# Patient Record
Sex: Male | Born: 1955 | Race: Black or African American | Hispanic: No | Marital: Single | Smoking: Current every day smoker
Health system: Southern US, Community
[De-identification: ages and names within clinical notes are randomized; demographics above are authoritative.]

## PROBLEM LIST (undated history)

## (undated) DIAGNOSIS — M545 Low back pain, unspecified: Secondary | ICD-10-CM

## (undated) DIAGNOSIS — I10 Essential (primary) hypertension: Secondary | ICD-10-CM

## (undated) DIAGNOSIS — K219 Gastro-esophageal reflux disease without esophagitis: Secondary | ICD-10-CM

## (undated) DIAGNOSIS — G8929 Other chronic pain: Secondary | ICD-10-CM

## (undated) HISTORY — DX: Other chronic pain: G89.29

## (undated) HISTORY — DX: Essential (primary) hypertension: I10

## (undated) HISTORY — DX: Low back pain, unspecified: M54.50

---

## 2014-07-08 ENCOUNTER — Encounter (HOSPITAL_COMMUNITY): Payer: Self-pay

## 2014-07-08 ENCOUNTER — Emergency Department (HOSPITAL_COMMUNITY): Payer: Self-pay

## 2014-07-08 ENCOUNTER — Emergency Department (HOSPITAL_COMMUNITY)
Admission: EM | Admit: 2014-07-08 | Discharge: 2014-07-08 | Disposition: A | Discharge status: Home / Self Care | Payer: Self-pay | Attending: Emergency Medicine | Admitting: Emergency Medicine

## 2014-07-08 DIAGNOSIS — Y9389 Activity, other specified: Secondary | ICD-10-CM | POA: Insufficient documentation

## 2014-07-08 DIAGNOSIS — S01111A Laceration without foreign body of right eyelid and periocular area, initial encounter: Secondary | ICD-10-CM | POA: Insufficient documentation

## 2014-07-08 DIAGNOSIS — Y998 Other external cause status: Secondary | ICD-10-CM | POA: Insufficient documentation

## 2014-07-08 DIAGNOSIS — Z720 Tobacco use: Secondary | ICD-10-CM | POA: Insufficient documentation

## 2014-07-08 DIAGNOSIS — W01198A Fall on same level from slipping, tripping and stumbling with subsequent striking against other object, initial encounter: Secondary | ICD-10-CM | POA: Insufficient documentation

## 2014-07-08 DIAGNOSIS — Y9289 Other specified places as the place of occurrence of the external cause: Secondary | ICD-10-CM | POA: Insufficient documentation

## 2014-07-08 DIAGNOSIS — S0181XA Laceration without foreign body of other part of head, initial encounter: Secondary | ICD-10-CM

## 2014-07-08 MED ORDER — TETANUS-DIPHTH-ACELL PERTUSSIS 5-2.5-18.5 LF-MCG/0.5 IM SUSP
0.5000 mL | Freq: Once | INTRAMUSCULAR | Status: AC
  Administered 2014-07-08: 0.5 mL via INTRAMUSCULAR
  Filled 2014-07-08: qty 0.5

## 2014-07-08 MED ORDER — LIDOCAINE HCL (PF) 2 % IJ SOLN
10.0000 mL | Freq: Once | INTRAMUSCULAR | Status: DC
  Filled 2014-07-08: qty 10

## 2014-07-08 NOTE — ED Provider Notes (Signed)
CSN: [id number]     Arrival date & time 07/08/14  [id number] History   First MD Initiated Contact with Patient 07/08/14 1001     Chief Complaint  Patient presents with  . Facial Laceration     (Consider location/radiation/quality/duration/timing/severity/associated sxs/prior Treatment) HPI Comments: Pt comes in with c/o laceration that occurred about 1 am. Denies loc with injury. States that he was getting wood and the window pane came out an hit his face. States that  He didn't come in then because he thought it was going to close but now it looks more open then it did previously. Denies pain  The history is provided by the patient. No language interpreter was used.    History reviewed. No pertinent past medical history. History reviewed. No pertinent past surgical history. No family history on file. History  Substance Use Topics  . Smoking status: Current Every Day Smoker  . Smokeless tobacco: Not on file  . Alcohol Use: Yes     Comment: occ    Review of Systems  All other systems reviewed and are negative.     Allergies  Review of patient's allergies indicates no known allergies.  Home Medications   Prior to Admission medications   Medication Sig Start Date End Date Taking? Authorizing Provider  ibuprofen (ADVIL,MOTRIN) 200 MG tablet Take 400 mg by mouth every 6 (six) hours as needed for moderate pain.   Yes Historical Provider, MD   BP 148/118 mmHg  Pulse 51  Temp(Src) 97.6 F (36.4 C) (Oral)  Resp 16  Ht 5\' 9"  (1.753 m)  Wt 145 lb (65.772 kg)  BMI 21.40 kg/m2  SpO2 99% Physical Exam  Constitutional: He is oriented to person, place, and time. He appears well-developed and well-nourished.  HENT:  Head: Normocephalic.  Eyes: Conjunctivae and EOM are normal. Pupils are equal, round, and reactive to light.  Cardiovascular: Normal rate and regular rhythm.   Pulmonary/Chest: Effort normal and breath sounds normal.  Musculoskeletal: Normal range of motion.    Neurological: He is alert and oriented to person, place, and time.  Skin:     Laceration from the right eyebrow to the bridge of the nose    ED Course  LACERATION REPAIR Date/Time: 07/08/2014 11:15 AM Performed by: Teressa LowerPICKERING, Lyfe Monger Authorized by: Teressa LowerPICKERING, Stacy Sailer Consent: Verbal consent obtained. Risks and benefits: risks, benefits and alternatives were discussed Consent given by: patient Patient identity confirmed: verbally with patient Body area: head/neck Location details: right eyelid Laceration length: 4.5 cm Foreign bodies: no foreign bodies Anesthesia: local infiltration Local anesthetic: lidocaine 2% without epinephrine Irrigation solution: saline Skin closure: 5-0 Prolene Number of sutures: 7 Technique: simple Approximation: close Approximation difficulty: simple Patient tolerance: Patient tolerated the procedure well with no immediate complications   (including critical care time) Labs Review Labs Reviewed - No data to display  Imaging Review Dg Nasal Bones  07/08/2014   CLINICAL DATA:  Pain following object falling onto face  EXAM: NASAL BONES - 3+ VIEW  COMPARISON:  None.  FINDINGS: Water's, right lateral, and left lateral images were obtained. There is no acute fracture or dislocation. There is opacification of the left maxillary antrum. Other paranasal sinuses are clear. Nasal septum is midline.  IMPRESSION: Diffuse opacification of the left maxillary antrum. Question sinusitis versus hemorrhage. No fracture or dislocation seen.   Electronically Signed   By: Bretta BangWilliam  Woodruff III M.D.   On: 07/08/2014 12:32     EKG Interpretation None      MDM  Final diagnoses:  Facial laceration, initial encounter    Tetanus updated. No fracture noted. No need for antibiotics at this time. Discussed treatment and suture removal    Teressa Lower, NP 07/08/14 1312  Glynn Octave, MD 07/08/14 [id number]

## 2014-07-08 NOTE — Discharge Instructions (Signed)
Have the sutures removed in 5-7 days. Follow up sooner for any sign of infection Facial Laceration  A facial laceration is a cut on the face. These injuries can be painful and cause bleeding. Lacerations usually heal quickly, but they need special care to reduce scarring. DIAGNOSIS  Your health care provider will take a medical history, ask for details about how the injury occurred, and examine the wound to determine how deep the cut is. TREATMENT  Some facial lacerations may not require closure. Others may not be able to be closed because of an increased risk of infection. The risk of infection and the chance for successful closure will depend on various factors, including the amount of time since the injury occurred. The wound may be cleaned to help prevent infection. If closure is appropriate, pain medicines may be given if needed. Your health care provider will use stitches (sutures), wound glue (adhesive), or skin adhesive strips to repair the laceration. These tools bring the skin edges together to allow for faster healing and a better cosmetic outcome. If needed, you may also be given a tetanus shot. HOME CARE INSTRUCTIONS  Only take over-the-counter or prescription medicines as directed by your health care provider.  Follow your health care provider's instructions for wound care. These instructions will vary depending on the technique used for closing the wound. For Sutures:  Keep the wound clean and dry.   If you were given a bandage (dressing), you should change it at least once a day. Also change the dressing if it becomes wet or dirty, or as directed by your health care provider.   Wash the wound with soap and water 2 times a day. Rinse the wound off with water to remove all soap. Pat the wound dry with a clean towel.   After cleaning, apply a thin layer of the antibiotic ointment recommended by your health care provider. This will help prevent infection and keep the dressing from  sticking.   You may shower as usual after the first 24 hours. Do not soak the wound in water until the sutures are removed.   Get your sutures removed as directed by your health care provider. With facial lacerations, sutures should usually be taken out after 4-5 days to avoid stitch marks.   Wait a few days after your sutures are removed before applying any makeup. For Skin Adhesive Strips:  Keep the wound clean and dry.   Do not get the skin adhesive strips wet. You may bathe carefully, using caution to keep the wound dry.   If the wound gets wet, pat it dry with a clean towel.   Skin adhesive strips will fall off on their own. You may trim the strips as the wound heals. Do not remove skin adhesive strips that are still stuck to the wound. They will fall off in time.  For Wound Adhesive:  You may briefly wet your wound in the shower or bath. Do not soak or scrub the wound. Do not swim. Avoid periods of heavy sweating until the skin adhesive has fallen off on its own. After showering or bathing, gently pat the wound dry with a clean towel.   Do not apply liquid medicine, cream medicine, ointment medicine, or makeup to your wound while the skin adhesive is in place. This may loosen the film before your wound is healed.   If a dressing is placed over the wound, be careful not to apply tape directly over the skin adhesive. This may  cause the adhesive to be pulled off before the wound is healed.   Avoid prolonged exposure to sunlight or tanning lamps while the skin adhesive is in place.  The skin adhesive will usually remain in place for 5-10 days, then naturally fall off the skin. Do not pick at the adhesive film.  After Healing: Once the wound has healed, cover the wound with sunscreen during the day for 1 full year. This can help minimize scarring. Exposure to ultraviolet light in the first year will darken the scar. It can take 1-2 years for the scar to lose its redness and to  heal completely.  SEEK IMMEDIATE MEDICAL CARE IF:  You have redness, pain, or swelling around the wound.   You see ayellowish-white fluid (pus) coming from the wound.   You have chills or a fever.  MAKE SURE YOU:  Understand these instructions.  Will watch your condition.  Will get help right away if you are not doing well or get worse. Document Released: 06/21/2004 Document Revised: 03/04/2013 Document Reviewed: 12/25/2012 [facility] Patient Information 2015 [place], [place]. This information is not intended to replace advice given to you by your health care provider. Make sure you discuss any questions you have with your health care provider.

## 2014-07-08 NOTE — ED Notes (Signed)
Pt reports was outside getting some wood and one of the window panes from the 2nd floor fell and hit pt in the forehead.  Denies any LOC.

## 2017-03-05 ENCOUNTER — Emergency Department (HOSPITAL_COMMUNITY)
Admission: EM | Admit: 2017-03-05 | Discharge: 2017-03-05 | Disposition: A | Discharge status: Home / Self Care | Payer: Self-pay | Attending: Emergency Medicine | Admitting: Emergency Medicine

## 2017-03-05 ENCOUNTER — Encounter (HOSPITAL_COMMUNITY): Payer: Self-pay | Admitting: Emergency Medicine

## 2017-03-05 ENCOUNTER — Emergency Department (HOSPITAL_COMMUNITY): Payer: Self-pay

## 2017-03-05 DIAGNOSIS — Z79899 Other long term (current) drug therapy: Secondary | ICD-10-CM | POA: Insufficient documentation

## 2017-03-05 DIAGNOSIS — F17200 Nicotine dependence, unspecified, uncomplicated: Secondary | ICD-10-CM | POA: Insufficient documentation

## 2017-03-05 DIAGNOSIS — S61212A Laceration without foreign body of right middle finger without damage to nail, initial encounter: Secondary | ICD-10-CM | POA: Insufficient documentation

## 2017-03-05 DIAGNOSIS — S62639B Displaced fracture of distal phalanx of unspecified finger, initial encounter for open fracture: Secondary | ICD-10-CM

## 2017-03-05 DIAGNOSIS — T1490XA Injury, unspecified, initial encounter: Secondary | ICD-10-CM

## 2017-03-05 DIAGNOSIS — Z7982 Long term (current) use of aspirin: Secondary | ICD-10-CM | POA: Insufficient documentation

## 2017-03-05 DIAGNOSIS — W231XXA Caught, crushed, jammed, or pinched between stationary objects, initial encounter: Secondary | ICD-10-CM | POA: Insufficient documentation

## 2017-03-05 DIAGNOSIS — Y939 Activity, unspecified: Secondary | ICD-10-CM | POA: Insufficient documentation

## 2017-03-05 DIAGNOSIS — Z23 Encounter for immunization: Secondary | ICD-10-CM | POA: Insufficient documentation

## 2017-03-05 DIAGNOSIS — S61319A Laceration without foreign body of unspecified finger with damage to nail, initial encounter: Secondary | ICD-10-CM

## 2017-03-05 DIAGNOSIS — S62632A Displaced fracture of distal phalanx of right middle finger, initial encounter for closed fracture: Secondary | ICD-10-CM | POA: Insufficient documentation

## 2017-03-05 DIAGNOSIS — Y929 Unspecified place or not applicable: Secondary | ICD-10-CM | POA: Insufficient documentation

## 2017-03-05 DIAGNOSIS — Y999 Unspecified external cause status: Secondary | ICD-10-CM | POA: Insufficient documentation

## 2017-03-05 DIAGNOSIS — S62634A Displaced fracture of distal phalanx of right ring finger, initial encounter for closed fracture: Secondary | ICD-10-CM | POA: Insufficient documentation

## 2017-03-05 MED ORDER — CEFAZOLIN SODIUM-DEXTROSE 1-4 GM/50ML-% IV SOLN
1.0000 g | Freq: Once | INTRAVENOUS | Status: AC
  Administered 2017-03-05: 1 g via INTRAVENOUS
  Filled 2017-03-05: qty 50

## 2017-03-05 MED ORDER — POVIDONE-IODINE 10 % EX SOLN
CUTANEOUS | Status: AC
  Administered 2017-03-05: 1
  Filled 2017-03-05: qty 30

## 2017-03-05 MED ORDER — ONDANSETRON HCL 4 MG/2ML IJ SOLN
INTRAMUSCULAR | Status: AC
  Administered 2017-03-05: 4 mg via INTRAVENOUS
  Filled 2017-03-05: qty 2

## 2017-03-05 MED ORDER — TETANUS-DIPHTH-ACELL PERTUSSIS 5-2.5-18.5 LF-MCG/0.5 IM SUSP
0.5000 mL | Freq: Once | INTRAMUSCULAR | Status: AC
  Administered 2017-03-05: 0.5 mL via INTRAMUSCULAR
  Filled 2017-03-05: qty 0.5

## 2017-03-05 MED ORDER — CEPHALEXIN 500 MG PO CAPS: 500.0000 mg | ORAL_CAPSULE | Freq: Four times a day (QID) | ORAL | 0 refills | Status: DC

## 2017-03-05 MED ORDER — SULFAMETHOXAZOLE-TRIMETHOPRIM 800-160 MG PO TABS: 1.0000 | ORAL_TABLET | Freq: Two times a day (BID) | ORAL | 0 refills | Status: AC

## 2017-03-05 MED ORDER — HYDROCODONE-ACETAMINOPHEN 5-325 MG PO TABS
1.0000 | ORAL_TABLET | Freq: Once | ORAL | Status: AC
  Administered 2017-03-05: 1 via ORAL

## 2017-03-05 MED ORDER — HYDROMORPHONE HCL 1 MG/ML IJ SOLN
1.0000 mg | Freq: Once | INTRAMUSCULAR | Status: AC
  Administered 2017-03-05: 1 mg via INTRAVENOUS

## 2017-03-05 MED ORDER — BACITRACIN-NEOMYCIN-POLYMYXIN 400-5-5000 EX OINT
TOPICAL_OINTMENT | CUTANEOUS | Status: AC
  Filled 2017-03-05: qty 1

## 2017-03-05 MED ORDER — HYDROCODONE-ACETAMINOPHEN 5-325 MG PO TABS: 1.0000 | ORAL_TABLET | Freq: Four times a day (QID) | ORAL | 0 refills | Status: DC | PRN

## 2017-03-05 MED ORDER — ACETAMINOPHEN ER 650 MG PO TBCR: 650.0000 mg | EXTENDED_RELEASE_TABLET | Freq: Three times a day (TID) | ORAL | 0 refills | Status: DC | PRN

## 2017-03-05 MED ORDER — LIDOCAINE-EPINEPHRINE (PF) 2 %-1:200000 IJ SOLN
10.0000 mL | Freq: Once | INTRAMUSCULAR | Status: AC
  Administered 2017-03-05: 5 mL
  Filled 2017-03-05: qty 20

## 2017-03-05 MED ORDER — HYDROCODONE-ACETAMINOPHEN 5-325 MG PO TABS
ORAL_TABLET | ORAL | Status: AC
  Administered 2017-03-05: 1 via ORAL
  Filled 2017-03-05: qty 1

## 2017-03-05 MED ORDER — BACITRACIN-NEOMYCIN-POLYMYXIN 400-5-5000 EX OINT
TOPICAL_OINTMENT | Freq: Once | CUTANEOUS | Status: AC
  Administered 2017-03-05: 16:00:00 via TOPICAL

## 2017-03-05 MED ORDER — ONDANSETRON HCL 4 MG/2ML IJ SOLN
4.0000 mg | Freq: Once | INTRAMUSCULAR | Status: AC
  Administered 2017-03-05: 4 mg via INTRAVENOUS

## 2017-03-05 MED ORDER — HYDROMORPHONE HCL 1 MG/ML IJ SOLN
INTRAMUSCULAR | Status: AC
  Administered 2017-03-05: 1 mg via INTRAVENOUS
  Filled 2017-03-05: qty 1

## 2017-03-05 NOTE — ED Notes (Signed)
Suture cart to bedside. 

## 2017-03-05 NOTE — ED Triage Notes (Signed)
Pt had log fall onto right hand pta. Gapping wounds to right ring and middle finger with bleeding controlled. Swelling noted also

## 2017-03-05 NOTE — Discharge Instructions (Signed)
Please see the orthopedic surgeon as requested this week. Call them upon discharge for an appointment by Friday. Start taking the antibiotics as prescribed. Return to the ER if your pain gets severe.  Keep the dressing on for 2 days.

## 2017-03-05 NOTE — ED Notes (Signed)
Patient transported to X-ray 

## 2017-03-05 NOTE — ED Provider Notes (Addendum)
[facility]-EMERGENCY DEPT Provider Note   CSN: [id number] Arrival date & time: 03/05/17  1242     History   Chief Complaint Chief Complaint  Patient presents with  . Laceration    HPI Paul Madden is a 61 y.o. male.  HPI Patient comes in the chief complaint of finger injury. Patient has no pertinent past medical history. He reports that he was working outside and had a long fall onto his right hand. Patient is noted to have deep lacerations to digits 3 and 4 with some bleeding. Patient denies any numbness or tingling. Tetanus status is unknown. Patient is not on any anticoagulation meds.  History reviewed. No pertinent past medical history.  There are no active problems to display for this patient.   History reviewed. No pertinent surgical history.     Home Medications    Prior to Admission medications   Medication Sig Start Date End Date Taking? Authorizing Provider  aspirin-sod bicarb-citric acid (ALKA-SELTZER) 325 MG TBEF tablet Take 325 mg by mouth every 6 (six) hours as needed (acid reflux).   Yes [provider]  ibuprofen (ADVIL,MOTRIN) 200 MG tablet Take 400 mg by mouth every 6 (six) hours as needed for moderate pain.   Yes [provider]  acetaminophen (TYLENOL 8 HOUR) 650 MG CR tablet Take 1 tablet (650 mg total) by mouth every 8 (eight) hours as needed for pain. 03/05/17   Derwood Kaplan, MD  cephALEXin (KEFLEX) 500 MG capsule Take 1 capsule (500 mg total) by mouth 4 (four) times daily. 03/05/17   Derwood Kaplan, MD  HYDROcodone-acetaminophen (NORCO/VICODIN) 5-325 MG tablet Take 1 tablet by mouth every 6 (six) hours as needed for severe pain. 03/05/17   Derwood Kaplan, MD  sulfamethoxazole-trimethoprim (BACTRIM DS,SEPTRA DS) 800-160 MG tablet Take 1 tablet by mouth 2 (two) times daily. 03/05/17 03/12/17  Derwood Kaplan, MD    Family History History reviewed. No pertinent family history.  Social History Social History  Substance Use Topics  .  Smoking status: Current Every Day Smoker  . Smokeless tobacco: Never Used  . Alcohol use Yes     Comment: occ     Allergies   Patient has no known allergies.   Review of Systems Review of Systems  Constitutional: Positive for activity change.  Skin: Positive for wound.  Allergic/Immunologic: Negative for immunocompromised state.  Neurological: Negative for numbness.  Hematological: Does not bruise/bleed easily.  All other systems reviewed and are negative.    Physical Exam Updated Vital Signs BP (!) 162/95 (BP Location: Left Arm)   Pulse 95   Temp 98 F (36.7 C) (Oral)   Resp 17   SpO2 95%   Physical Exam  Constitutional: He is oriented to person, place, and time. He appears well-developed.  HENT:  Head: Atraumatic.  Neck: Neck supple.  Cardiovascular: Normal rate.   Pulmonary/Chest: Effort normal.  Musculoskeletal:  Digit 3 and 4 have deep, gaping lacerations without any macerated tissue. The laceration is complex, and on digit #4 extends towards the nail laterally.  The lacerations are distal to the DIP joint. Patient also has subungual hematoma.  There are multiple other small lacerations on the same digits. Those lacerations are superficial in nature.  The nail integrity is not compromised   Neurological: He is alert and oriented to person, place, and time.  Patient able to move his digits distal to the DIP joint, and patient is able to discriminate between sharp and dull for both digits  Skin: Skin is warm.  Nursing note and vitals reviewed.    ED Treatments / Results  Labs (all labs ordered are listed, but only abnormal results are displayed) Labs Reviewed - No data to display  EKG  EKG Interpretation None       Radiology Dg Hand Complete Right  Result Date: 03/05/2017 CLINICAL DATA:  Walled fell on hand. EXAM: RIGHT HAND - COMPLETE 3+ VIEW COMPARISON:  None. FINDINGS: Comminuted fracture deformities are noted involving the tufts of the third  and fourth distal phalanges. The fracture fragments appear splayed and displaced dorsally. Overlying soft tissue lacerations noted. IMPRESSION: 1. Acute fracture deformities are noted involving the tufts of the third and fourth distal phalanges. Electronically Signed   By: Signa Kell M.D.   On: 03/05/2017 13:44    Procedures  DIGIT 3  .Nerve Block Date/Time: 03/05/2017 4:31 PM Performed by: Derwood Kaplan Authorized by: Derwood Kaplan   Consent:    Consent obtained:  Verbal   Consent given by:  Patient   Risks discussed:  Allergic reaction, infection, nerve damage, swelling, unsuccessful block, pain and bleeding   Alternatives discussed:  No treatment Indications:    Indications:  Pain relief and procedural anesthesia Location:    Body area:  Upper extremity (digit 3, right hand)   Upper extremity nerve blocked: DIGITAL NERVE BLOCK.   Laterality:  Right Pre-procedure details:    Skin preparation:  2% chlorhexidine   Preparation: Patient was prepped and draped in usual sterile fashion   Procedure details (see MAR for exact dosages):    Block needle gauge:  25 G   Anesthetic injected:  Lidocaine 2% WITH epi   Injection procedure:  Anatomic landmarks identified Post-procedure details:    Dressing:  Sterile dressing   Outcome:  Pain relieved   Patient tolerance of procedure:  Tolerated well, no immediate complications    Procedures DIGIT 4 .Nerve Block Date/Time: 03/05/2017 4:31 PM Performed by: Derwood Kaplan Authorized by: Derwood Kaplan   Consent:    Consent obtained:  Verbal   Consent given by:  Patient   Risks discussed:  Allergic reaction, infection, nerve damage, swelling, unsuccessful block, pain and bleeding   Alternatives discussed:  No treatment Indications:    Indications:  Pain relief and procedural anesthesia Location:    Body area:  Upper extremity   Upper extremity nerve blocked: DIGITAL NERVE BLOCK.   Laterality:  Right Pre-procedure details:     Skin preparation:  2% chlorhexidine   Preparation: Patient was prepped and draped in usual sterile fashion   Procedure details (see MAR for exact dosages):    Block needle gauge:  25 G   Anesthetic injected:  Lidocaine 2% WITH epi   Injection procedure:  Anatomic landmarks identified Post-procedure details:    Dressing:  Sterile dressing   Outcome:  Pain relieved   Patient tolerance of procedure:  Tolerated well, no immediate complications  LACERATION REPAIR Performed by: Derwood Kaplan Authorized by: Derwood Kaplan Consent: Verbal consent obtained. Risks and benefits: risks, benefits and alternatives were discussed Consent given by: patient Patient identity confirmed: provided demographic data Prepped and Draped in normal sterile fashion Wound explored  Laceration Location: right hand digit 3 and 4  Laceration Length: total: 8 cm  No Foreign Bodies seen or palpated  Anesthesia: digital nerve block  Irrigation method: syringe Amount of cleaning: copious  Skin closure: primary  Number of sutures: 17  Technique: simple inturrupted with 4-0 and 5-0 nylon  Patient tolerance: Patient tolerated the procedure well with no  immediate complications.  SPLINT APPLICATION Date/Time: 4:39 PM Authorized by: Derwood Kaplan Consent: Verbal consent obtained. Risks and benefits: risks, benefits and alternatives were discussed Consent given by: patient Splint applied by: Nurse Location details: finger 2 and 3, R hand Splint type: static Supplies used: webrill, gauze, kerlex, splint Post-procedure: The splinted body part was neurovascularly unchanged following the procedure. Patient tolerance: Patient tolerated the procedure well with no immediate complications.     (including critical care time)  Medications Ordered in ED Medications  HYDROmorphone (DILAUDID) injection 1 mg (1 mg Intravenous Given 03/05/17 1304)  ondansetron (ZOFRAN) injection 4 mg (4 mg Intravenous Given  03/05/17 1304)  lidocaine-EPINEPHrine (XYLOCAINE W/EPI) 2 %-1:200000 (PF) injection 10 mL (5 mLs Infiltration Given 03/05/17 1428)  Tdap (BOOSTRIX) injection 0.5 mL (0.5 mLs Intramuscular Given 03/05/17 1420)  povidone-iodine (BETADINE) 10 % external solution (1 application  Given 03/05/17 1428)  ceFAZolin (ANCEF) IVPB 1 g/50 mL premix (0 g Intravenous Stopped 03/05/17 1456)  neomycin-bacitracin-polymyxin (NEOSPORIN) ointment ( Topical Given 03/05/17 1616)     Initial Impression / Assessment and Plan / ED Course  I have reviewed the triage vital signs and the nursing notes.  Pertinent labs & imaging results that were available during my care of the patient were reviewed by me and considered in my medical decision making (see chart for details).  Clinical Course as of Mar 06 1627  Tue Mar 05, 2017  1433 Discussed case with Dr. Eulah Pont, orthopedics. Dr. Eulah Pont has reviewed the image and is aware of the laceration that the patient is sustained. He recommends laceration repair in the emergency room and bulky dressing. He would want the patient to be started on Keflex and be seen in his clinic later this week. I have discussed these recommendations with the patient. DG Hand Complete Right [AN]    Clinical Course User Index [AN] Derwood Kaplan, MD    Patient comes in with crushtype injury to his digits 3 and 4. Patient has open tuft fractures with complex lacerations. Fortunately the skin despite the deep laceration is not macerated. I spoke with hand surgery, Dr. Eulah Pont would appreciate repair in the emergency department. He will see the patient in the clinic later this week.  Patient informed the high risk of infection due to the contaminated nature of the wound. After performing a digital nerve block, the copiously irrigated the wound with the Water initially and then sterile normal saline. The laceration was repaired to the best of my ability and there were no immediate complications.  Patient's  wound will be placed in a splint and a bulky wound as per the request of Dr. Eulah Pont. Strict return precautions have been discussed. Patient will be started on antibiotics.  The patient was counseled on the dangers of tobacco use, and was advised to quit.  Reviewed strategies to maximize success, including removing cigarettes and smoking materials from environment, substitution of other forms of reinforcement and support of family/friends.   Final Clinical Impressions(s) / ED Diagnoses   Final diagnoses:  Open fracture of tuft of distal phalanx of finger  Laceration of finger of right hand without foreign body with damage to nail, unspecified finger, initial encounter    New Prescriptions New Prescriptions   ACETAMINOPHEN (TYLENOL 8 HOUR) 650 MG CR TABLET    Take 1 tablet (650 mg total) by mouth every 8 (eight) hours as needed for pain.   CEPHALEXIN (KEFLEX) 500 MG CAPSULE    Take 1 capsule (500 mg total) by mouth 4 (  four) times daily.   HYDROCODONE-ACETAMINOPHEN (NORCO/VICODIN) 5-325 MG TABLET    Take 1 tablet by mouth every 6 (six) hours as needed for severe pain.   SULFAMETHOXAZOLE-TRIMETHOPRIM (BACTRIM DS,SEPTRA DS) 800-160 MG TABLET    Take 1 tablet by mouth 2 (two) times daily.     Derwood Kaplan, MD 03/05/17 1640    Derwood Kaplan, MD 03/05/17 [phone]

## 2019-02-18 ENCOUNTER — Other Ambulatory Visit: Payer: Self-pay | Admitting: *Deleted

## 2019-02-18 DIAGNOSIS — Z20822 Contact with and (suspected) exposure to covid-19: Secondary | ICD-10-CM

## 2019-02-20 LAB — NOVEL CORONAVIRUS, NAA: SARS-CoV-2, NAA: NOT DETECTED

## 2019-09-11 ENCOUNTER — Ambulatory Visit: Payer: Self-pay | Attending: Internal Medicine

## 2019-09-11 DIAGNOSIS — Z23 Encounter for immunization: Secondary | ICD-10-CM

## 2019-09-11 NOTE — Progress Notes (Signed)
   Covid-19 Vaccination Clinic  Name:  Paul Madden    MRN: [id number] DOB: May 04, 1956  09/11/2019  Paul Madden was observed post Covid-19 immunization for 15 minutes without incident. He was provided with Vaccine Information Sheet and instruction to access the V-Safe system.   Paul Madden was instructed to call 911 with any severe reactions post vaccine: Marland Kitchen Difficulty breathing  . Swelling of face and throat  . A fast heartbeat  . A bad rash all over body  . Dizziness and weakness   Immunizations Administered    Name Date Dose VIS Date Route   Moderna COVID-19 Vaccine 09/11/2019 10:11 AM 0.5 mL 04/28/2019 Intramuscular   Manufacturer: Moderna   Lot: [id number]   NDC: 54982-641-58

## 2019-10-13 ENCOUNTER — Ambulatory Visit: Payer: Self-pay | Attending: Internal Medicine

## 2019-10-13 DIAGNOSIS — Z23 Encounter for immunization: Secondary | ICD-10-CM

## 2019-10-13 NOTE — Progress Notes (Signed)
   Covid-19 Vaccination Clinic  Name:  Paul Madden    MRN: [id number] DOB: 05-Jun-1955  10/13/2019  Mr. Teed was observed post Covid-19 immunization for 15 minutes without incident. He was provided with Vaccine Information Sheet and instruction to access the V-Safe system.   Mr. Kozak was instructed to call 911 with any severe reactions post vaccine: Marland Kitchen Difficulty breathing  . Swelling of face and throat  . A fast heartbeat  . A bad rash all over body  . Dizziness and weakness   Immunizations Administered    Name Date Dose VIS Date Route   Moderna COVID-19 Vaccine 10/13/2019 10:52 AM 0.5 mL 04/2019 Intramuscular   Manufacturer: Moderna   Lot: [id number]   NDC: 62694-854-62

## 2020-05-25 ENCOUNTER — Other Ambulatory Visit: Payer: Self-pay

## 2020-05-25 ENCOUNTER — Emergency Department (HOSPITAL_COMMUNITY)
Admission: EM | Admit: 2020-05-25 | Discharge: 2020-05-25 | Disposition: A | Discharge status: Home / Self Care | Payer: Self-pay | Attending: Emergency Medicine | Admitting: Emergency Medicine

## 2020-05-25 ENCOUNTER — Emergency Department (HOSPITAL_COMMUNITY): Payer: Self-pay

## 2020-05-25 ENCOUNTER — Encounter (HOSPITAL_COMMUNITY): Payer: Self-pay | Admitting: *Deleted

## 2020-05-25 DIAGNOSIS — Z7982 Long term (current) use of aspirin: Secondary | ICD-10-CM | POA: Insufficient documentation

## 2020-05-25 DIAGNOSIS — F17200 Nicotine dependence, unspecified, uncomplicated: Secondary | ICD-10-CM | POA: Insufficient documentation

## 2020-05-25 DIAGNOSIS — L03031 Cellulitis of right toe: Secondary | ICD-10-CM | POA: Insufficient documentation

## 2020-05-25 MED ORDER — IBUPROFEN 600 MG PO TABS: 600.0000 mg | ORAL_TABLET | Freq: Four times a day (QID) | ORAL | 0 refills | Status: DC | PRN

## 2020-05-25 MED ORDER — IBUPROFEN 800 MG PO TABS
800.0000 mg | ORAL_TABLET | Freq: Once | ORAL | Status: AC
  Administered 2020-05-25: 16:00:00 800 mg via ORAL
  Filled 2020-05-25: qty 1

## 2020-05-25 MED ORDER — SULFAMETHOXAZOLE-TRIMETHOPRIM 800-160 MG PO TABS: 1.0000 | ORAL_TABLET | Freq: Two times a day (BID) | ORAL | 0 refills | Status: DC

## 2020-05-25 MED ORDER — SULFAMETHOXAZOLE-TRIMETHOPRIM 800-160 MG PO TABS
1.0000 | ORAL_TABLET | Freq: Once | ORAL | Status: AC
  Administered 2020-05-25: 16:00:00 1 via ORAL
  Filled 2020-05-25: qty 1

## 2020-05-25 MED ORDER — SULFAMETHOXAZOLE-TRIMETHOPRIM 800-160 MG PO TABS: 1.0000 | ORAL_TABLET | Freq: Two times a day (BID) | ORAL | 0 refills | Status: AC

## 2020-05-25 NOTE — ED Provider Notes (Signed)
[facility] EMERGENCY DEPARTMENT Provider Note   CSN: [id number] Arrival date & time: 05/25/20  1110     History No chief complaint on file.   Paul Madden is a 64 y.o. male with no significant past medical history presenting with right great toe pain and swelling with drainage from the cuticle edge.  He reports chronic problems with ingrown toenail.  He also reports dropping a heavy object on this toe within the past month and since notes an intermittent clicking sensation with movement. He has used warm water soaks but as found no relief with this treatment.  He has also used aspirin and tylenol but has persistent pain which radiates into the midfoot and worsens at night when trying to sleep.  HPI     History reviewed. No pertinent past medical history.  There are no problems to display for this patient.   History reviewed. No pertinent surgical history.     History reviewed. No pertinent family history.  Social History   Tobacco Use  . Smoking status: Current Every Day Smoker  . Smokeless tobacco: Never Used  Substance Use Topics  . Alcohol use: Yes    Comment: occ  . Drug use: No    Home Medications Prior to Admission medications   Medication Sig Start Date End Date Taking? Authorizing Provider  ibuprofen (ADVIL) 600 MG tablet Take 1 tablet (600 mg total) by mouth every 6 (six) hours as needed for moderate pain. 05/25/20  Yes Elma Limas, Raynelle Fanning, PA-C  sulfamethoxazole-trimethoprim (BACTRIM DS) 800-160 MG tablet Take 1 tablet by mouth 2 (two) times daily for 10 days. 05/25/20 06/04/20 Yes Galena Logie, Raynelle Fanning, PA-C  acetaminophen (TYLENOL 8 HOUR) 650 MG CR tablet Take 1 tablet (650 mg total) by mouth every 8 (eight) hours as needed for pain. 03/05/17   Derwood Kaplan, MD  aspirin-sod bicarb-citric acid (ALKA-SELTZER) 325 MG TBEF tablet Take 325 mg by mouth every 6 (six) hours as needed (acid reflux).    [provider]  cephALEXin (KEFLEX) 500 MG capsule Take 1 capsule (500  mg total) by mouth 4 (four) times daily. 03/05/17   Derwood Kaplan, MD  HYDROcodone-acetaminophen (NORCO/VICODIN) 5-325 MG tablet Take 1 tablet by mouth every 6 (six) hours as needed for severe pain. 03/05/17   Derwood Kaplan, MD    Allergies    Patient has no known allergies.  Review of Systems   Review of Systems  Constitutional: Negative for fever.  Musculoskeletal: Positive for arthralgias. Negative for joint swelling and myalgias.  Skin: Positive for color change.  Neurological: Negative for weakness and numbness.    Physical Exam Updated Vital Signs BP (!) 147/82   Pulse 64   Temp 98.2 F (36.8 C) (Oral)   Resp 16   Ht 5\' 9"  (1.753 m)   Wt 68 kg   SpO2 100%   BMI 22.15 kg/m   Physical Exam Constitutional:      Appearance: He is well-developed and well-nourished.  HENT:     Head: Atraumatic.  Cardiovascular:     Comments: Pulses equal bilaterally Musculoskeletal:        General: Tenderness present.     Cervical back: Normal range of motion.  Skin:    General: Skin is warm and dry.     Findings: Erythema present.     Comments: Mild erythema surrounding the medial cuticle of the right great toe.  No fluctuant mass, no red streaking. Trace of serous drainage with palpation from along the cuticle edge which is scabbed.  The nail is widened at the base but does not appear to be growing into the skin distally as it is cut way back to near the base.    Neurological:     Mental Status: He is alert.     Sensory: No sensory deficit.     Deep Tendon Reflexes: Strength normal. Reflexes normal.  Psychiatric:        Mood and Affect: Mood and affect normal.     ED Results / Procedures / Treatments   Labs (all labs ordered are listed, but only abnormal results are displayed) Labs Reviewed - No data to display  EKG None  Radiology DG Toe Great Right  Result Date: 05/25/2020 CLINICAL DATA:  Pain, swelling, recent crush injury. Patient reports great toe infection with  history of ingrown toenail. EXAM: RIGHT GREAT TOE COMPARISON:  None. FINDINGS: There is no evidence of fracture or dislocation. No erosion, periosteal reaction or bony destruction. Joint spaces preserved. Trace osteophytes. Mild soft tissue edema. No soft tissue air or radiopaque foreign body. IMPRESSION: No acute osseous abnormality. Mild soft tissue edema. Electronically Signed   By: Narda Rutherford M.D.   On: 05/25/2020 16:02    Procedures Procedures (including critical care time)  Medications Ordered in ED Medications  sulfamethoxazole-trimethoprim (BACTRIM DS) 800-160 MG per tablet 1 tablet (has no administration in time range)  ibuprofen (ADVIL) tablet 800 mg (has no administration in time range)    ED Course  I have reviewed the triage vital signs and the nursing notes.  Pertinent labs & imaging results that were available during my care of the patient were reviewed by me and considered in my medical decision making (see chart for details).    MDM Rules/Calculators/A&P                          Pt with exam c/w cuticle infection but no obvious spiking ingrown nail and no abscess pocket identified.  Xray negative for acute fracture.  He was placed on Bactrim, ibuprofen prescribed for pain and inflammation.  Advised warm Epsom salt soaks.  As needed follow-up with podiatry, referral given to Dr. Nolen Mu if symptoms persist or worsen.   Final Clinical Impression(s) / ED Diagnoses Final diagnoses:  Paronychia of great toe, right    Rx / DC Orders ED Discharge Orders         Ordered    ibuprofen (ADVIL) 600 MG tablet  Every 6 hours PRN        05/25/20 1613    sulfamethoxazole-trimethoprim (BACTRIM DS) 800-160 MG tablet  2 times daily        05/25/20 1613           Burgess Amor, PA-C 05/25/20 1617    Derwood Kaplan, MD 05/25/20 [phone]

## 2020-05-25 NOTE — ED Triage Notes (Signed)
Pt states he has infection to right great toe, hx of ingrown toenail to same toe.  Pt denies hx of diabetes.

## 2020-05-25 NOTE — Discharge Instructions (Addendum)
Completed entire course of the antibiotics prescribed taking your next dose tomorrow morning.  You need to soak your toe in warm Epsom salt water twice daily for 10 minutes.  You may take the ibuprofen which will help with pain and swelling.  Your x-ray is negative for any fracture of your toe.  Your toenail does not appear to be ingrown at this time, but if this continues to be a problem for you you may need to see a podiatrist.  Call Dr. Nolen Mu listed above who can help you with this problem.

## 2021-03-27 ENCOUNTER — Other Ambulatory Visit: Payer: Self-pay

## 2021-03-27 ENCOUNTER — Encounter: Payer: Self-pay | Admitting: Internal Medicine

## 2021-03-27 ENCOUNTER — Ambulatory Visit: Payer: Medicare Other | Admitting: Internal Medicine

## 2021-03-27 ENCOUNTER — Encounter (INDEPENDENT_AMBULATORY_CARE_PROVIDER_SITE_OTHER): Payer: Self-pay

## 2021-03-27 VITALS — BP 142/70 | HR 75 | Temp 98.1°F | Ht 69.0 in | Wt 131.0 lb

## 2021-03-27 DIAGNOSIS — I10 Essential (primary) hypertension: Secondary | ICD-10-CM | POA: Diagnosis not present

## 2021-03-27 DIAGNOSIS — Z23 Encounter for immunization: Secondary | ICD-10-CM | POA: Diagnosis not present

## 2021-03-27 DIAGNOSIS — Z114 Encounter for screening for human immunodeficiency virus [HIV]: Secondary | ICD-10-CM | POA: Diagnosis not present

## 2021-03-27 DIAGNOSIS — Z789 Other specified health status: Secondary | ICD-10-CM

## 2021-03-27 DIAGNOSIS — Z0000 Encounter for general adult medical examination without abnormal findings: Secondary | ICD-10-CM

## 2021-03-27 DIAGNOSIS — G8929 Other chronic pain: Secondary | ICD-10-CM

## 2021-03-27 DIAGNOSIS — M545 Low back pain, unspecified: Secondary | ICD-10-CM

## 2021-03-27 DIAGNOSIS — E559 Vitamin D deficiency, unspecified: Secondary | ICD-10-CM

## 2021-03-27 DIAGNOSIS — L600 Ingrowing nail: Secondary | ICD-10-CM

## 2021-03-27 DIAGNOSIS — Z1159 Encounter for screening for other viral diseases: Secondary | ICD-10-CM

## 2021-03-27 DIAGNOSIS — R252 Cramp and spasm: Secondary | ICD-10-CM | POA: Diagnosis not present

## 2021-03-27 DIAGNOSIS — Z720 Tobacco use: Secondary | ICD-10-CM

## 2021-03-27 MED ORDER — AMLODIPINE BESYLATE 5 MG PO TABS: 5.0000 mg | ORAL_TABLET | Freq: Every day | ORAL | 0 refills | Status: DC

## 2021-03-27 MED ORDER — NAPROXEN 500 MG PO TABS: 500.0000 mg | ORAL_TABLET | Freq: Two times a day (BID) | ORAL | 0 refills | Status: DC

## 2021-03-27 NOTE — Assessment & Plan Note (Signed)
BP Readings from Last 1 Encounters:  03/27/21 (!) 142/70   Uncontrolled due to noncompliance Started amlodipine 5 mg daily Counseled for compliance with the medications Advised DASH diet and moderate exercise/walking, at least 150 mins/week

## 2021-03-27 NOTE — Assessment & Plan Note (Signed)
Takes 1 beer every day We will check CMP considering his overall health and complaint of leg cramps, advised to cut down

## 2021-03-27 NOTE — Assessment & Plan Note (Signed)
Smokes about a pack/day  Asked about quitting: confirms that he currently smokes cigarettes Advise to quit smoking: Educated about QUITTING to reduce the risk of cancer, cardio and cerebrovascular disease. Assess willingness: Unwilling to quit at this time, but is working on cutting back. Assist with counseling and pharmacotherapy: Counseled for 5 minutes and literature provided. Arrange for follow up: Follow up in 3 months and continue to offer help.

## 2021-03-27 NOTE — Progress Notes (Signed)
New Patient Office Visit  Subjective:  Patient ID: Paul Madden, male    DOB: 1955/07/31  Age: 65 y.o. MRN: [id number]  CC:  Chief Complaint  Patient presents with   New Patient (Initial Visit)    New pt. Been several years since has seen PCP    HPI Paul Madden is a 65 year old male with PMH of HTN, chronic low back pain, tobacco abuse and alcohol use who presents for establishing care.  HTN: His BP was elevated in the office today upon multiple measurements.  He reports that he was placed on an antihypertensive medicine while he was in jail, but has not had any PCP for a long time.  He denies any headache, dizziness, chest pain, dyspnea or palpitations.  Chronic low back pain: Reports having low back pain, which is chronic, intermittent, worse with movement.  He reports heavy lifting at his work.  He also has had multiple falls on his back in the past while at work.  Denies any numbness or tingling of the LE.  Denies any saddle anesthesia, urinary or stool incontinence.  He complains of left leg cramping, worse with walking.  Denies any knee or ankle pain currently.  No leg swelling reported.  Of note, he drinks a beer every day.  He also complains of right foot pain, around great toe area and on the lateral border of the right foot.  He has had a crush injury of right foot, and has had ingrown nail of right great toe since then.  He still has soreness around the toenail.  He also has a thick skin/corn near fifth toe, which is more painful.  He also has noticed pus discharge from the corner area in the past.  Denies any recent injury of right foot.  He has had 2 doses of COVID-vaccine.  He received flu vaccine in the office today.  Past Medical History:  Diagnosis Date   Chronic low back pain    Hypertension     History reviewed. No pertinent surgical history.  History reviewed. No pertinent family history.  Social History   Socioeconomic History   Marital status: Single     Spouse name: Not on file   Number of children: Not on file   Years of education: Not on file   Highest education level: Not on file  Occupational History   Not on file  Tobacco Use   Smoking status: Every Day   Smokeless tobacco: Never  Substance and Sexual Activity   Alcohol use: Yes    Comment: occ   Drug use: No   Sexual activity: Not on file  Other Topics Concern   Not on file  Social History Narrative   Not on file   Social Determinants of Health   Financial Resource Strain: Not on file  Food Insecurity: Not on file  Transportation Needs: Not on file  Physical Activity: Not on file  Stress: Not on file  Social Connections: Not on file  Intimate Partner Violence: Not on file    ROS Review of Systems  Constitutional:  Negative for chills and fever.  HENT:  Negative for congestion and sore throat.   Eyes:  Negative for pain and discharge.  Respiratory:  Negative for cough and shortness of breath.   Cardiovascular:  Negative for chest pain and palpitations.  Gastrointestinal:  Negative for constipation, diarrhea, nausea and vomiting.  Endocrine: Negative for polydipsia and polyuria.  Genitourinary:  Negative for dysuria and hematuria.  Musculoskeletal:  Negative for neck pain and neck stiffness.       Right foot pain  Skin:        Corn over right foot  Neurological:  Negative for dizziness, weakness, numbness and headaches.  Psychiatric/Behavioral:  Negative for agitation and behavioral problems.    Objective:   Today's Vitals: BP (!) 142/70 (BP Location: Left Arm, Cuff Size: Normal)   Pulse 75   Temp 98.1 F (36.7 C) (Oral)   Ht _0  (1.753 m)   Wt 131 lb (59.4 kg)   SpO2 97%   BMI 19.35 kg/m   Physical Exam Vitals reviewed.  Constitutional:      General: He is not in acute distress.    Appearance: He is not diaphoretic.  HENT:     Head: Normocephalic and atraumatic.     Nose: Nose normal.     Mouth/Throat:     Mouth: Mucous membranes are moist.   Eyes:     General: No scleral icterus.    Extraocular Movements: Extraocular movements intact.  Cardiovascular:     Rate and Rhythm: Normal rate and regular rhythm.     Pulses: Normal pulses.     Heart sounds: Normal heart sounds. No murmur heard. Pulmonary:     Breath sounds: Normal breath sounds. No wheezing or rales.  Abdominal:     Palpations: Abdomen is soft.     Tenderness: There is no abdominal tenderness.  Musculoskeletal:     Cervical back: Neck supple. No tenderness.     Right lower leg: No edema.     Left lower leg: No edema.     Comments:    Skin:    General: Skin is warm.     Findings: No rash.     Comments: Right toe ingrown nail, surrounding erythema and tenderness Corn near lateral border of right foot  Neurological:     General: No focal deficit present.     Mental Status: He is alert and oriented to person, place, and time.     Sensory: No sensory deficit.     Motor: No weakness.  Psychiatric:        Mood and Affect: Mood normal.        Behavior: Behavior normal.    Assessment & Plan:   Problem List Items Addressed This Visit       Cardiovascular and Mediastinum   Essential hypertension    BP Readings from Last 1 Encounters:  03/27/21 (!) 142/70  Uncontrolled due to noncompliance Started amlodipine 5 mg daily Counseled for compliance with the medications Advised DASH diet and moderate exercise/walking, at least 150 mins/week       Relevant Medications   amLODipine (NORVASC) 5 MG tablet   Other Relevant Orders   CBC with Differential/Platelet   CMP14+EGFR     Musculoskeletal and Integument   Ingrown nail of great toe of right foot    Has had crush injury over right foot Has ingrown nail of right great toe Referred to podiatry      Relevant Orders   Ambulatory referral to Podiatry     Other   Encounter for medical examination to establish care - Primary    Care established History and medications reviewed with the patient       Relevant Orders   CBC with Differential/Platelet   CMP14+EGFR   Lipid panel   Chronic bilateral low back pain without sciatica    Reports heavy lifting at work Has had recurrent falls Check x-ray  lumbar spine Likely has degenerative disc disease Naproxen as needed for now Back brace and/or heating pad Avoid heavy lifting      Relevant Medications   naproxen (NAPROSYN) 500 MG tablet   Other Relevant Orders   DG Lumbar Spine Complete   Leg cramps    Check CBC and CMP Could be electrolyte or other micronutrient deficiency Takes alcohol on daily basis -could be a reason as well      Tobacco abuse    Smokes about a pack/day  Asked about quitting: confirms that he currently smokes cigarettes Advise to quit smoking: Educated about QUITTING to reduce the risk of cancer, cardio and cerebrovascular disease. Assess willingness: Unwilling to quit at this time, but is working on cutting back. Assist with counseling and pharmacotherapy: Counseled for 5 minutes and literature provided. Arrange for follow up: Follow up in 3 months and continue to offer help.      Alcohol use    Takes 1 beer every day We will check CMP considering his overall health and complaint of leg cramps, advised to cut down      Other Visit Diagnoses     Need for hepatitis C screening test       Relevant Orders   Hepatitis C Antibody   Encounter for screening for HIV       Relevant Orders   HIV antibody (with reflex)   Vitamin D deficiency       Relevant Orders   Vitamin D (25 hydroxy)   Need for immunization against influenza       Relevant Orders   Flu Vaccine QUAD High Dose(Fluad) (Completed)       Outpatient Encounter Medications as of 03/27/2021  Medication Sig   acetaminophen (TYLENOL 8 HOUR) 650 MG CR tablet Take 1 tablet (650 mg total) by mouth every 8 (eight) hours as needed for pain.   amLODipine (NORVASC) 5 MG tablet Take 1 tablet (5 mg total) by mouth daily.   naproxen (NAPROSYN) 500  MG tablet Take 1 tablet (500 mg total) by mouth 2 (two) times daily with a meal.   [DISCONTINUED] aspirin-sod bicarb-citric acid (ALKA-SELTZER) 325 MG TBEF tablet Take 325 mg by mouth every 6 (six) hours as needed (acid reflux).   [DISCONTINUED] cephALEXin (KEFLEX) 500 MG capsule Take 1 capsule (500 mg total) by mouth 4 (four) times daily.   [DISCONTINUED] HYDROcodone-acetaminophen (NORCO/VICODIN) 5-325 MG tablet Take 1 tablet by mouth every 6 (six) hours as needed for severe pain.   [DISCONTINUED] ibuprofen (ADVIL) 600 MG tablet Take 1 tablet (600 mg total) by mouth every 6 (six) hours as needed for moderate pain.   No facility-administered encounter medications on file as of 03/27/2021.    Follow-up: Return in about 2 months (around 05/27/2021) for HTN.   Lindell Spar, MD

## 2021-03-27 NOTE — Assessment & Plan Note (Signed)
Care established History and medications reviewed with the patient 

## 2021-03-27 NOTE — Assessment & Plan Note (Signed)
Check CBC and CMP Could be electrolyte or other micronutrient deficiency Takes alcohol on daily basis -could be a reason as well

## 2021-03-27 NOTE — Assessment & Plan Note (Signed)
Reports heavy lifting at work Has had recurrent falls Check x-ray lumbar spine Likely has degenerative disc disease Naproxen as needed for now Back brace and/or heating pad Avoid heavy lifting

## 2021-03-27 NOTE — Assessment & Plan Note (Signed)
Has had crush injury over right foot Has ingrown nail of right great toe Referred to podiatry

## 2021-03-27 NOTE — Patient Instructions (Signed)
Please start taking amlodipine as prescribed.  Please follow low-salt diet and ambulate as tolerated.  Please try to cut down to quit smoking.  Please take naproxen as needed for back pain.  Okay to use back brace on heating pad.  Avoid heavy lifting.  Please get x-ray of lumbar spine done at [facility].  You are being referred to podiatry for evaluation of ingrown toenail.  Please keep the area clean and dry.  Okay to apply Compound W around the wart area.

## 2021-03-28 ENCOUNTER — Emergency Department (HOSPITAL_COMMUNITY)
Admission: EM | Admit: 2021-03-28 | Discharge: 2021-03-28 | Disposition: A | Discharge status: Home / Self Care | Payer: Medicare Other | Attending: Emergency Medicine | Admitting: Emergency Medicine

## 2021-03-28 ENCOUNTER — Encounter (HOSPITAL_COMMUNITY): Payer: Self-pay | Admitting: *Deleted

## 2021-03-28 DIAGNOSIS — Z5321 Procedure and treatment not carried out due to patient leaving prior to being seen by health care provider: Secondary | ICD-10-CM | POA: Insufficient documentation

## 2021-03-28 DIAGNOSIS — M545 Low back pain, unspecified: Secondary | ICD-10-CM | POA: Insufficient documentation

## 2021-03-28 LAB — CMP14+EGFR
ALT: 14 IU/L (ref 0–44)
AST: 30 IU/L (ref 0–40)
Albumin/Globulin Ratio: 1.8 (ref 1.2–2.2)
Albumin: 4.7 g/dL (ref 3.8–4.8)
Alkaline Phosphatase: 80 IU/L (ref 44–121)
BUN/Creatinine Ratio: 9 — ABNORMAL LOW (ref 10–24)
BUN: 11 mg/dL (ref 8–27)
Bilirubin Total: 0.2 mg/dL (ref 0.0–1.2)
CO2: 24 mmol/L (ref 20–29)
Calcium: 9.4 mg/dL (ref 8.6–10.2)
Chloride: 104 mmol/L (ref 96–106)
Creatinine, Ser: 1.27 mg/dL (ref 0.76–1.27)
Globulin, Total: 2.6 g/dL (ref 1.5–4.5)
Glucose: 75 mg/dL (ref 70–99)
Potassium: 5.6 mmol/L — ABNORMAL HIGH (ref 3.5–5.2)
Sodium: 144 mmol/L (ref 134–144)
Total Protein: 7.3 g/dL (ref 6.0–8.5)
eGFR: 63 mL/min/{1.73_m2} (ref >59)

## 2021-03-28 LAB — CBC WITH DIFFERENTIAL/PLATELET
Basophils Absolute: 0 10*3/uL (ref 0.0–0.2)
Basos: 0 %
EOS (ABSOLUTE): 0.1 10*3/uL (ref 0.0–0.4)
Eos: 1 %
Hematocrit: 35.9 % — ABNORMAL LOW (ref 37.5–51.0)
Hemoglobin: 11.9 g/dL — ABNORMAL LOW (ref 13.0–17.7)
Immature Grans (Abs): 0 10*3/uL (ref 0.0–0.1)
Immature Granulocytes: 1 %
Lymphocytes Absolute: 1.2 10*3/uL (ref 0.7–3.1)
Lymphs: 16 %
MCH: 29.2 pg (ref 26.6–33.0)
MCHC: 33.1 g/dL (ref 31.5–35.7)
MCV: 88 fL (ref 79–97)
Monocytes Absolute: 0.4 10*3/uL (ref 0.1–0.9)
Monocytes: 5 %
Neutrophils Absolute: 5.8 10*3/uL (ref 1.4–7.0)
Neutrophils: 77 %
Platelets: 463 10*3/uL — ABNORMAL HIGH (ref 150–450)
RBC: 4.08 x10E6/uL — ABNORMAL LOW (ref 4.14–5.80)
RDW: 13.2 % (ref 11.6–15.4)
WBC: 7.5 10*3/uL (ref 3.4–10.8)

## 2021-03-28 LAB — LIPID PANEL
Chol/HDL Ratio: 2.3 ratio (ref 0.0–5.0)
Cholesterol, Total: 134 mg/dL (ref 100–199)
HDL: 59 mg/dL (ref >39)
LDL Chol Calc (NIH): 63 mg/dL (ref 0–99)
Triglycerides: 56 mg/dL (ref 0–149)
VLDL Cholesterol Cal: 12 mg/dL (ref 5–40)

## 2021-03-28 LAB — HEPATITIS C ANTIBODY: Hep C Virus Ab: <0.1 s/co ratio (ref 0.0–0.9)

## 2021-03-28 LAB — VITAMIN D 25 HYDROXY (VIT D DEFICIENCY, FRACTURES): Vit D, 25-Hydroxy: 28.1 ng/mL — ABNORMAL LOW (ref 30.0–100.0)

## 2021-03-28 LAB — HIV ANTIBODY (ROUTINE TESTING W REFLEX): HIV Screen 4th Generation wRfx: NONREACTIVE

## 2021-03-28 NOTE — ED Triage Notes (Signed)
States he was sent over her to get xrays

## 2021-03-31 ENCOUNTER — Telehealth: Payer: Self-pay | Admitting: Internal Medicine

## 2021-03-31 NOTE — Telephone Encounter (Signed)
Pt called in about test results

## 2021-04-03 NOTE — Telephone Encounter (Signed)
Patient aware of results.

## 2021-04-10 ENCOUNTER — Ambulatory Visit (INDEPENDENT_AMBULATORY_CARE_PROVIDER_SITE_OTHER): Payer: Medicare Other | Admitting: Podiatry

## 2021-04-10 ENCOUNTER — Other Ambulatory Visit: Payer: Self-pay | Admitting: Podiatry

## 2021-04-10 ENCOUNTER — Encounter: Payer: Self-pay | Admitting: Podiatry

## 2021-04-10 ENCOUNTER — Other Ambulatory Visit: Payer: Self-pay

## 2021-04-10 DIAGNOSIS — L84 Corns and callosities: Secondary | ICD-10-CM | POA: Diagnosis not present

## 2021-04-10 DIAGNOSIS — L03031 Cellulitis of right toe: Secondary | ICD-10-CM

## 2021-04-10 DIAGNOSIS — L600 Ingrowing nail: Secondary | ICD-10-CM

## 2021-04-10 MED ORDER — CEPHALEXIN 500 MG PO CAPS: 500.0000 mg | ORAL_CAPSULE | Freq: Four times a day (QID) | ORAL | 0 refills | Status: AC

## 2021-04-10 NOTE — Patient Instructions (Signed)

## 2021-04-10 NOTE — Progress Notes (Signed)
  Subjective:  Patient ID: Paul Madden, male    DOB: 1956/02/05,   MRN: [id number]  Chief Complaint  Patient presents with   Foot Ulcer    I have a spot on the side of the right foot and has been there for about 2 to 3 months   Nail Problem    The right big toenail and 2nd toenail on the right comes and goes and does have some draining and I do soak it    65 y.o. male presents for concern of ingrown right great toenail and sore on the top of his left foot. Ingrown has been present for about 5 months. Relates draining and pain has not tried any treatments. Relates his sore developed recently after wearing a certain shoe that rubs in the area. Has stopped wearing the shoe and using neosporin in the area.  . Denies any other pedal complaints. Denies n/v/f/c.   Past Medical History:  Diagnosis Date   Chronic low back pain    Hypertension     Objective:  Physical Exam: Vascular: DP/PT pulses 2/4 bilateral. CFT <3 seconds. Normal hair growth on digits. No edema.  Skin. No lacerations or abrasions bilateral feet. Incurvation of medial and lateral borders of right hallux with underlying purulence edema and erythema and maceration. Dorsal fifth metatrsal with hyperkeratosis and pre-ulcerative.  Musculoskeletal: MMT 5/5 bilateral lower extremities in DF, PF, Inversion and Eversion. Deceased ROM in DF of ankle joint.  Neurological: Sensation intact to light touch.   Assessment:   1. Ingrown right greater toenail   2. Paronychia of great toe, right   3. Pre-ulcerative calluses      Plan:  Patient was evaluated and treated and all questions answered. Patient requesting removal of ingrown nail today. Procedure below.  Hyperkeratoti lesion was debrided. Advised to avoid shoes that cause rubbing. Neosporin and bandaid applied.  Discussed procedure and post procedure care and patient expressed understanding.  Will follow-up in 2 weeks for nail check or sooner if any problems arise.     Procedure:  Procedure: total Nail Avulsion of right hallux  Surgeon: Louann Sjogren, DPM  Pre-op Dx: Ingrown toenail with infection Post-op: Same  Place of Surgery: Office exam room.  Indications for surgery: Painful and ingrown toenail.    The patient is requesting removal of nail without chemical matrixectomy. Risks and complications were discussed with the patient for which they understand and written consent was obtained. Under sterile conditions a total of 3 mL of 1:1 mixture 0.5% marcaine plain and 1% lidocaine plain was infiltrated in a hallux block fashion. Once anesthetized, the skin was prepped in sterile fashion. A tourniquet was then applied. Next the  right hallux nail was removed making sure to remove the entire offending nail border.  Area was copiously irrigated. Silvadene was applied. A dry sterile dressing was applied. After application of the dressing the tourniquet was removed and there is found to be an immediate capillary refill time to the digit. The patient tolerated the procedure well without any complications. Post procedure instructions were discussed the patient for which he verbally understood. Follow-up in two weeks for nail check or sooner if any problems are to arise. Discussed signs/symptoms of infection and directed to call the office immediately should any occur or go directly to the emergency room. In the meantime, encouraged to call the office with any questions, concerns, changes symptoms.   Louann Sjogren, DPM

## 2021-04-24 ENCOUNTER — Other Ambulatory Visit: Payer: Self-pay

## 2021-04-24 ENCOUNTER — Encounter: Payer: Self-pay | Admitting: Podiatry

## 2021-04-24 ENCOUNTER — Ambulatory Visit (INDEPENDENT_AMBULATORY_CARE_PROVIDER_SITE_OTHER): Payer: Medicare Other | Admitting: Podiatry

## 2021-04-24 DIAGNOSIS — L600 Ingrowing nail: Secondary | ICD-10-CM

## 2021-04-24 DIAGNOSIS — L84 Corns and callosities: Secondary | ICD-10-CM | POA: Diagnosis not present

## 2021-04-24 NOTE — Progress Notes (Signed)
  Subjective:  Patient ID: Latham Kinzler, male    DOB: September 16, 1955,   MRN: [id number]  No chief complaint on file.   65 y.o. male presents for follow-up of ingrown right great toenail procedure and sore on the top of his left foot. States he is still sore on the left foot. But the toe is better. Has been soaking and doing the neosporin . Denies any other pedal complaints. Denies n/v/f/c.   Past Medical History:  Diagnosis Date   Chronic low back pain    Hypertension     Objective:  Physical Exam: Vascular: DP/PT pulses 2/4 bilateral. CFT <3 seconds. Normal hair growth on digits. No edema.  Skin. No lacerations or abrasions bilateral feet. Hallux nail bed appears to be well healing with granular tissue. No erythema edema or purulence noted. Mild maceration noted to lateral nail bed.   Dorsal fifth metatrsal with hyperkeratosis and pre-ulcerative area.  Musculoskeletal: MMT 5/5 bilateral lower extremities in DF, PF, Inversion and Eversion. Deceased ROM in DF of ankle joint.  Neurological: Sensation intact to light touch.   Assessment:   1. Ingrown right greater toenail   2. Pre-ulcerative calluses       Plan:  Patient was evaluated and treated and all questions answered. Toe was evaluated and appears to be healing well.  May discontinue soaks and neosporin. Betadine placed on toe today to help dry out some of the maceration.  Lesion evaluated on the left and appears to be healing well. Advised to keep padding on the area and avoid tight shoes.  Patient to follow-up as needed.        Louann Sjogren, DPM

## 2021-05-18 DIAGNOSIS — Z1152 Encounter for screening for COVID-19: Secondary | ICD-10-CM | POA: Diagnosis not present

## 2021-05-25 ENCOUNTER — Ambulatory Visit (INDEPENDENT_AMBULATORY_CARE_PROVIDER_SITE_OTHER): Payer: Medicare Other | Admitting: Internal Medicine

## 2021-05-25 ENCOUNTER — Other Ambulatory Visit: Payer: Self-pay

## 2021-05-25 ENCOUNTER — Ambulatory Visit (HOSPITAL_COMMUNITY)
Admission: RE | Admit: 2021-05-25 | Discharge: 2021-05-25 | Disposition: A | Discharge status: Home / Self Care | Payer: Medicare Other | Source: Ambulatory Visit | Attending: Internal Medicine | Admitting: Internal Medicine

## 2021-05-25 ENCOUNTER — Encounter: Payer: Self-pay | Admitting: Internal Medicine

## 2021-05-25 ENCOUNTER — Emergency Department (HOSPITAL_COMMUNITY)
Admission: EM | Admit: 2021-05-25 | Discharge: 2021-05-25 | Disposition: A | Discharge status: Home / Self Care | Payer: Medicare Other | Attending: Emergency Medicine | Admitting: Emergency Medicine

## 2021-05-25 ENCOUNTER — Encounter (HOSPITAL_COMMUNITY): Payer: Self-pay

## 2021-05-25 VITALS — BP 138/84 | HR 83 | Resp 18 | Ht 69.0 in | Wt 135.1 lb

## 2021-05-25 DIAGNOSIS — G8929 Other chronic pain: Secondary | ICD-10-CM

## 2021-05-25 DIAGNOSIS — M549 Dorsalgia, unspecified: Secondary | ICD-10-CM | POA: Insufficient documentation

## 2021-05-25 DIAGNOSIS — L600 Ingrowing nail: Secondary | ICD-10-CM | POA: Diagnosis not present

## 2021-05-25 DIAGNOSIS — M47816 Spondylosis without myelopathy or radiculopathy, lumbar region: Secondary | ICD-10-CM | POA: Diagnosis not present

## 2021-05-25 DIAGNOSIS — I10 Essential (primary) hypertension: Secondary | ICD-10-CM

## 2021-05-25 DIAGNOSIS — Z5321 Procedure and treatment not carried out due to patient leaving prior to being seen by health care provider: Secondary | ICD-10-CM | POA: Insufficient documentation

## 2021-05-25 DIAGNOSIS — G609 Hereditary and idiopathic neuropathy, unspecified: Secondary | ICD-10-CM | POA: Diagnosis not present

## 2021-05-25 DIAGNOSIS — M545 Low back pain, unspecified: Secondary | ICD-10-CM | POA: Insufficient documentation

## 2021-05-25 DIAGNOSIS — L729 Follicular cyst of the skin and subcutaneous tissue, unspecified: Secondary | ICD-10-CM

## 2021-05-25 MED ORDER — GABAPENTIN 100 MG PO CAPS: 100.0000 mg | ORAL_CAPSULE | Freq: Three times a day (TID) | ORAL | 3 refills | Status: DC

## 2021-05-25 MED ORDER — AMLODIPINE BESYLATE 5 MG PO TABS: 5.0000 mg | ORAL_TABLET | Freq: Every day | ORAL | 1 refills | Status: DC

## 2021-05-25 NOTE — Progress Notes (Signed)
Established Patient Office Visit  Subjective:  Patient ID: Paul Madden, male    DOB: 1956-02-29  Age: 65 y.o. MRN: [id number]  CC:  Chief Complaint  Patient presents with   Follow-up    2 month follow up pt is still having numbness in  right hand also had toenail removed at [facility] and doesn't seem like its healing well advised to reach out to [facility] pt left form to get a nurse come out to home also has cyst on left side would like looked at     HPI Paul Madden is a 65 y.o. male with past medical history of HTN, chronic low back pain, tobacco abuse and alcohol use who presents for f/u of his chronic medical conditions.  HTN: BP is well-controlled now. Takes medications regularly. Patient denies headache, dizziness, chest pain, dyspnea or palpitations.  He c/o persistent toenail pain, for which he has seen Podiatrist. He had total nail avulsion of right great toe. Denies any pus discharge from the area.  He reports having a cyst over left hip area, which has been present chronically, and is not painful. He does not want any drainage for now.  He c/o numbness and tingling of bilateral hands.  Of note, he has had severe injury of his hand in the past, which required suturing of multiple fingers.  He also reports burning pain over his feet, which is chronic.  He has not had lumbar spine X-ray yet. He continues to have low back pain, which is chronic, intermittent, worse with movement.  He reports heavy lifting at his work.  He also has had multiple falls on his back in the past while at work.  Denies any numbness or tingling of the LE.  Denies any saddle anesthesia, urinary or stool incontinence.   Past Medical History:  Diagnosis Date   Chronic low back pain    Hypertension     History reviewed. No pertinent surgical history.  History reviewed. No pertinent family history.  Social History   Socioeconomic History   Marital status: Single    Spouse name: Not on file    Number of children: Not on file   Years of education: Not on file   Highest education level: Not on file  Occupational History   Not on file  Tobacco Use   Smoking status: Every Day   Smokeless tobacco: Never  Substance and Sexual Activity   Alcohol use: Yes    Comment: occ   Drug use: No   Sexual activity: Not on file  Other Topics Concern   Not on file  Social History Narrative   Not on file   Social Determinants of Health   Financial Resource Strain: Not on file  Food Insecurity: Not on file  Transportation Needs: Not on file  Physical Activity: Not on file  Stress: Not on file  Social Connections: Not on file  Intimate Partner Violence: Not on file    Outpatient Medications Prior to Visit  Medication Sig Dispense Refill   acetaminophen (TYLENOL 8 HOUR) 650 MG CR tablet Take 1 tablet (650 mg total) by mouth every 8 (eight) hours as needed for pain. 30 tablet 0   Ferrous Sulfate (IRON PO) Take by mouth.     MULTIPLE VITAMINS PO Take by mouth.     naproxen (NAPROSYN) 500 MG tablet Take 1 tablet (500 mg total) by mouth 2 (two) times daily with a meal. 30 tablet 0   amLODipine (NORVASC) 5  MG tablet Take 1 tablet (5 mg total) by mouth daily. 90 tablet 0   No facility-administered medications prior to visit.    No Known Allergies  ROS Review of Systems  Constitutional:  Negative for chills and fever.  HENT:  Negative for congestion and sore throat.   Eyes:  Negative for pain and discharge.  Respiratory:  Negative for cough and shortness of breath.   Cardiovascular:  Negative for chest pain and palpitations.  Gastrointestinal:  Negative for constipation, diarrhea, nausea and vomiting.  Endocrine: Negative for polydipsia and polyuria.  Genitourinary:  Negative for dysuria and hematuria.  Musculoskeletal:  Negative for neck pain and neck stiffness.       Right foot pain  Skin:        Corn over right foot  Neurological:  Negative for dizziness, weakness, numbness  and headaches.  Psychiatric/Behavioral:  Negative for agitation and behavioral problems.      Objective:    Physical Exam Vitals reviewed.  Constitutional:      General: He is not in acute distress.    Appearance: He is not diaphoretic.  HENT:     Head: Normocephalic and atraumatic.     Nose: Nose normal.     Mouth/Throat:     Mouth: Mucous membranes are moist.  Eyes:     General: No scleral icterus.    Extraocular Movements: Extraocular movements intact.  Cardiovascular:     Rate and Rhythm: Normal rate and regular rhythm.     Pulses: Normal pulses.     Heart sounds: Normal heart sounds. No murmur heard. Pulmonary:     Breath sounds: Normal breath sounds. No wheezing or rales.  Abdominal:     Palpations: Abdomen is soft.     Tenderness: There is no abdominal tenderness.  Musculoskeletal:     Cervical back: Neck supple. No tenderness.     Right lower leg: No edema.     Left lower leg: No edema.     Comments:    Skin:    General: Skin is warm.     Findings: No rash.     Comments: Corn near lateral border of right foot  Neurological:     General: No focal deficit present.     Mental Status: He is alert and oriented to person, place, and time.     Sensory: Sensory deficit (B/l hands) present.     Motor: Weakness (B/l LE - 4/5) present.  Psychiatric:        Mood and Affect: Mood normal.        Behavior: Behavior normal.    BP 138/84 (BP Location: Left Arm, Patient Position: Sitting, Cuff Size: Normal)    Pulse 83    Resp 18    Ht 5' 9"  (1.753 m)    Wt 135 lb 1.3 oz (61.3 kg)    SpO2 95%    BMI 19.95 kg/m  Wt Readings from Last 3 Encounters:  05/25/21 140 lb (63.5 kg)  05/25/21 135 lb 1.3 oz (61.3 kg)  03/27/21 131 lb (59.4 kg)    No results found for: TSH Lab Results  Component Value Date   WBC 7.5 03/27/2021   HGB 11.9 (L) 03/27/2021   HCT 35.9 (L) 03/27/2021   MCV 88 03/27/2021   PLT 463 (H) 03/27/2021   Lab Results  Component Value Date   NA 144  03/27/2021   K 5.6 (H) 03/27/2021   CO2 24 03/27/2021   GLUCOSE 75 03/27/2021   BUN  11 03/27/2021   CREATININE 1.27 03/27/2021   BILITOT 0.2 03/27/2021   ALKPHOS 80 03/27/2021   AST 30 03/27/2021   ALT 14 03/27/2021   PROT 7.3 03/27/2021   ALBUMIN 4.7 03/27/2021   CALCIUM 9.4 03/27/2021   EGFR 63 03/27/2021   Lab Results  Component Value Date   CHOL 134 03/27/2021   Lab Results  Component Value Date   HDL 59 03/27/2021   Lab Results  Component Value Date   LDLCALC 63 03/27/2021   Lab Results  Component Value Date   TRIG 56 03/27/2021   Lab Results  Component Value Date   CHOLHDL 2.3 03/27/2021   No results found for: HGBA1C    Assessment & Plan:   Problem List Items Addressed This Visit       Cardiovascular and Mediastinum   Essential hypertension - Primary    BP: 138/84   Well-controlled with Amlodipine Counseled for compliance with the medications Advised DASH diet and moderate exercise/walking, at least 150 mins/week      Relevant Medications   amLODipine (NORVASC) 5 MG tablet     Nervous and Auditory   Idiopathic peripheral neuropathy    Has numbness and tingling in hands, has had injury of hand in the past Also has feet pain, with weakness Started Gabapentin 100 mg TID      Relevant Medications   gabapentin (NEURONTIN) 100 MG capsule     Musculoskeletal and Integument   Ingrown nail of great toe of right foot    Needs to follow up with Podiatry, has had avulsion of toenail        Other   Chronic bilateral low back pain without sciatica    Reports heavy lifting at work Has had recurrent falls Check x-ray lumbar spine Likely has degenerative disc disease Naproxen as needed for now Back brace and/or heating pad Avoid heavy lifting      Relevant Medications   gabapentin (NEURONTIN) 100 MG capsule   Subcutaneous cyst    Over left hip area, nontender Reassured it being benign, does not want drainage for now Advised to contact if  any change in size or if it becomes painful       Meds ordered this encounter  Medications   gabapentin (NEURONTIN) 100 MG capsule    Sig: Take 1 capsule (100 mg total) by mouth 3 (three) times daily.    Dispense:  90 capsule    Refill:  3   amLODipine (NORVASC) 5 MG tablet    Sig: Take 1 tablet (5 mg total) by mouth daily.    Dispense:  90 tablet    Refill:  1    Follow-up: Return in about 4 months (around 09/23/2021) for HTN and peripheral neuropathy.    Lindell Spar, MD

## 2021-05-25 NOTE — Assessment & Plan Note (Signed)
Over left hip area, nontender Reassured it being benign, does not want drainage for now Advised to contact if any change in size or if it becomes painful

## 2021-05-25 NOTE — Assessment & Plan Note (Signed)
Has numbness and tingling in hands, has had injury of hand in the past Also has feet pain, with weakness Started Gabapentin 100 mg TID

## 2021-05-25 NOTE — ED Triage Notes (Signed)
Reports back pain and his MD sent him for xrays no bowel or bladder incontinence

## 2021-05-25 NOTE — Assessment & Plan Note (Signed)
Reports heavy lifting at work Has had recurrent falls Check x-ray lumbar spine Likely has degenerative disc disease Naproxen as needed for now Back brace and/or heating pad Avoid heavy lifting 

## 2021-05-25 NOTE — Assessment & Plan Note (Signed)
BP: 138/84   Well-controlled with Amlodipine Counseled for compliance with the medications Advised DASH diet and moderate exercise/walking, at least 150 mins/week

## 2021-05-25 NOTE — Patient Instructions (Signed)
Please contact Podiatry office for toenail pain.  Please start taking Gabapentin for neuropathic pain.  Please perform simple hand exercises as mentioned.  Please get X-ray done as ordered.

## 2021-05-25 NOTE — Assessment & Plan Note (Signed)
Needs to follow up with Podiatry, has had avulsion of toenail

## 2021-05-25 NOTE — ED Notes (Signed)
Patient presented to ED, registered and roomed. Upon assessment patient indicated he was here for outpatient chest xray ordered by Dr. Allena Katz.

## 2021-05-29 ENCOUNTER — Encounter: Payer: Self-pay | Admitting: Internal Medicine

## 2021-05-29 DIAGNOSIS — M51369 Other intervertebral disc degeneration, lumbar region without mention of lumbar back pain or lower extremity pain: Secondary | ICD-10-CM | POA: Insufficient documentation

## 2021-05-29 DIAGNOSIS — M5136 Other intervertebral disc degeneration, lumbar region: Secondary | ICD-10-CM | POA: Insufficient documentation

## 2021-05-30 ENCOUNTER — Telehealth: Payer: Self-pay

## 2021-05-30 NOTE — Telephone Encounter (Signed)
Spoke with pt

## 2021-05-30 NOTE — Telephone Encounter (Signed)
Patient called said Dr patel would sign forms by 12:00 last Friday, 12/30.  Please contact patient regarding forms when ready. [phone].

## 2021-05-30 NOTE — Telephone Encounter (Signed)
Patient called to let nurse know has already had xray done, need forms completed. Call patient back at [phone].

## 2021-05-30 NOTE — Telephone Encounter (Signed)
Called to speak with patient to let him know that Forms were completed however when I called antionette neal answered she was upset as I needed to give him lab results and let her know I could only speak with patient as there was NO DPR in the chart to speak with her. She got upset and hung up on me

## 2021-05-30 NOTE — Telephone Encounter (Signed)
Attempted to call pt back was hung up on

## 2021-06-06 ENCOUNTER — Telehealth: Payer: Self-pay

## 2021-06-06 NOTE — Telephone Encounter (Signed)
error 

## 2021-06-06 NOTE — Telephone Encounter (Signed)
Revised [employer] forms  Copied Noted Sleeved

## 2021-06-13 ENCOUNTER — Emergency Department (HOSPITAL_COMMUNITY)
Admission: EM | Admit: 2021-06-13 | Discharge: 2021-06-13 | Disposition: A | Discharge status: Home / Self Care | Payer: Medicare Other | Attending: Emergency Medicine | Admitting: Emergency Medicine

## 2021-06-13 ENCOUNTER — Encounter (HOSPITAL_COMMUNITY): Payer: Self-pay | Admitting: *Deleted

## 2021-06-13 ENCOUNTER — Emergency Department (HOSPITAL_COMMUNITY): Payer: Medicare Other

## 2021-06-13 DIAGNOSIS — L089 Local infection of the skin and subcutaneous tissue, unspecified: Secondary | ICD-10-CM | POA: Diagnosis not present

## 2021-06-13 DIAGNOSIS — Z79899 Other long term (current) drug therapy: Secondary | ICD-10-CM | POA: Diagnosis not present

## 2021-06-13 DIAGNOSIS — M79674 Pain in right toe(s): Secondary | ICD-10-CM | POA: Diagnosis present

## 2021-06-13 LAB — CBC WITH DIFFERENTIAL/PLATELET
Abs Immature Granulocytes: 0.06 10*3/uL (ref 0.00–0.07)
Basophils Absolute: 0 10*3/uL (ref 0.0–0.1)
Basophils Relative: 0 %
Eosinophils Absolute: 0.2 10*3/uL (ref 0.0–0.5)
Eosinophils Relative: 2 %
HCT: 39.6 % (ref 39.0–52.0)
Hemoglobin: 12.6 g/dL — ABNORMAL LOW (ref 13.0–17.0)
Immature Granulocytes: 1 %
Lymphocytes Relative: 12 %
Lymphs Abs: 1 10*3/uL (ref 0.7–4.0)
MCH: 29.7 pg (ref 26.0–34.0)
MCHC: 31.8 g/dL (ref 30.0–36.0)
MCV: 93.4 fL (ref 80.0–100.0)
Monocytes Absolute: 0.3 10*3/uL (ref 0.1–1.0)
Monocytes Relative: 4 %
Neutro Abs: 7.2 10*3/uL (ref 1.7–7.7)
Neutrophils Relative %: 81 %
Platelets: 496 10*3/uL — ABNORMAL HIGH (ref 150–400)
RBC: 4.24 MIL/uL (ref 4.22–5.81)
RDW: 14.1 % (ref 11.5–15.5)
WBC: 8.8 10*3/uL (ref 4.0–10.5)
nRBC: 0.2 % (ref 0.0–0.2)

## 2021-06-13 LAB — BASIC METABOLIC PANEL
Anion gap: 9 (ref 5–15)
BUN: 17 mg/dL (ref 8–23)
CO2: 26 mmol/L (ref 22–32)
Calcium: 9.6 mg/dL (ref 8.9–10.3)
Chloride: 101 mmol/L (ref 98–111)
Creatinine, Ser: 1.1 mg/dL (ref 0.61–1.24)
GFR, Estimated: >60 mL/min (ref >60)
Glucose, Bld: 102 mg/dL — ABNORMAL HIGH (ref 70–99)
Potassium: 4.5 mmol/L (ref 3.5–5.1)
Sodium: 136 mmol/L (ref 135–145)

## 2021-06-13 MED ORDER — OXYCODONE-ACETAMINOPHEN 5-325 MG PO TABS
1.0000 | ORAL_TABLET | Freq: Once | ORAL | Status: AC
  Administered 2021-06-13: 1 via ORAL
  Filled 2021-06-13: qty 1

## 2021-06-13 MED ORDER — CEPHALEXIN 500 MG PO CAPS
500.0000 mg | ORAL_CAPSULE | Freq: Once | ORAL | Status: AC
  Administered 2021-06-13: 500 mg via ORAL
  Filled 2021-06-13: qty 1

## 2021-06-13 MED ORDER — CEPHALEXIN 500 MG PO CAPS: 500.0000 mg | ORAL_CAPSULE | Freq: Four times a day (QID) | ORAL | 0 refills | Status: DC

## 2021-06-13 MED ORDER — SULFAMETHOXAZOLE-TRIMETHOPRIM 800-160 MG PO TABS: 1.0000 | ORAL_TABLET | Freq: Two times a day (BID) | ORAL | 0 refills | Status: DC

## 2021-06-13 MED ORDER — SULFAMETHOXAZOLE-TRIMETHOPRIM 800-160 MG PO TABS
1.0000 | ORAL_TABLET | Freq: Once | ORAL | Status: AC
  Administered 2021-06-13: 1 via ORAL
  Filled 2021-06-13: qty 1

## 2021-06-13 MED ORDER — HYDROCODONE-ACETAMINOPHEN 5-325 MG PO TABS: 2.0000 | ORAL_TABLET | ORAL | 0 refills | Status: DC | PRN

## 2021-06-13 NOTE — ED Provider Notes (Signed)
[facility] EMERGENCY DEPARTMENT Provider Note   CSN: [id number] Arrival date & time: 06/13/21  1010     History  Chief Complaint  Patient presents with   Toe Pain    Paul GeraldsJohn Madden is a 66 y.o. male.  HPI He presents for evaluation of right great toe pain after operative management for paronychia, 4 weeks ago.  He states he has severe pain with ambulation.  He denies fever, chills, nausea or vomiting.  He has not had follow-up care by the podiatrist yet.    Home Medications Prior to Admission medications   Medication Sig Start Date End Date Taking? Authorizing Provider  cephALEXin (KEFLEX) 500 MG capsule Take 1 capsule (500 mg total) by mouth 4 (four) times daily. 06/13/21  Yes Mancel BaleWentz, Lucile Didonato, MD  HYDROcodone-acetaminophen (NORCO) 5-325 MG tablet Take 2 tablets by mouth every 4 (four) hours as needed for moderate pain or severe pain. 06/13/21  Yes Mancel BaleWentz, Jourdin Gens, MD  sulfamethoxazole-trimethoprim (BACTRIM DS) 800-160 MG tablet Take 1 tablet by mouth 2 (two) times daily. 06/13/21  Yes Mancel BaleWentz, Davyd Podgorski, MD  acetaminophen (TYLENOL 8 HOUR) 650 MG CR tablet Take 1 tablet (650 mg total) by mouth every 8 (eight) hours as needed for pain. 03/05/17   Derwood KaplanNanavati, Ankit, MD  amLODipine (NORVASC) 5 MG tablet Take 1 tablet (5 mg total) by mouth daily. 05/25/21   Anabel HalonPatel, Rutwik K, MD  Ferrous Sulfate (IRON PO) Take by mouth.    [provider]  gabapentin (NEURONTIN) 100 MG capsule Take 1 capsule (100 mg total) by mouth 3 (three) times daily. 05/25/21   Anabel HalonPatel, Rutwik K, MD  MULTIPLE VITAMINS PO Take by mouth.    [provider]  naproxen (NAPROSYN) 500 MG tablet Take 1 tablet (500 mg total) by mouth 2 (two) times daily with a meal. 03/27/21   Anabel HalonPatel, Rutwik K, MD      Allergies    Patient has no allergy information on record.    Review of Systems   Review of Systems  All other systems reviewed and are negative.  Physical Exam Updated Vital Signs BP (!) 149/86    Pulse 81    Temp  (!) 97.5 F (36.4 C)    Resp 18    SpO2 99%  Physical Exam Vitals and nursing note reviewed.  Constitutional:      General: He is not in acute distress.    Appearance: He is well-developed. He is not ill-appearing or diaphoretic.  HENT:     Head: Normocephalic and atraumatic.     Right Ear: External ear normal.     Left Ear: External ear normal.  Eyes:     Conjunctiva/sclera: Conjunctivae normal.     Pupils: Pupils are equal, round, and reactive to light.  Neck:     Trachea: Phonation normal.  Cardiovascular:     Rate and Rhythm: Normal rate.  Pulmonary:     Effort: Pulmonary effort is normal.  Abdominal:     Tenderness: There is no abdominal tenderness.  Musculoskeletal:        General: Normal range of motion.     Cervical back: Normal range of motion and neck supple.     Comments: Ambulating normally.  Right great toe with purulent drainage, from site of a lateral distal partial nail removal.  Area is macerated, and has mild swelling.  No proximal streaking.  Pain with movement of the right great toe.  Skin:    General: Skin is warm and dry.  Neurological:  Mental Status: He is alert and oriented to person, place, and time.     Cranial Nerves: No cranial nerve deficit.     Sensory: No sensory deficit.     Motor: No abnormal muscle tone.     Coordination: Coordination normal.  Psychiatric:        Mood and Affect: Mood normal.        Behavior: Behavior normal.        Thought Content: Thought content normal.        Judgment: Judgment normal.    ED Results / Procedures / Treatments   Labs (all labs ordered are listed, but only abnormal results are displayed) Labs Reviewed  BASIC METABOLIC PANEL - Abnormal; Notable for the following components:      Result Value   Glucose, Bld 102 (*)    All other components within normal limits  CBC WITH DIFFERENTIAL/PLATELET - Abnormal; Notable for the following components:   Hemoglobin 12.6 (*)    Platelets 496 (*)    All other  components within normal limits    EKG None  Radiology DG Toe Great Right  Result Date: 06/13/2021 CLINICAL DATA:  Right toe wound EXAM: RIGHT GREAT TOE COMPARISON:  None. FINDINGS: Ulceration along the lateral aspect of the great toe. No evidence of subcutaneous gas. No evidence of fracture or malalignment. Perhaps slight rarefaction of the underlying great toe distal phalangeal tuft. No definitive erosions or other abnormalities. IMPRESSION: Ulceration overlying the lateral aspect of the distal great toe consistent with the clinical history of wound. Perhaps slight rarefaction of the underlying tuft of the distal phalanx without true erosion, fracture or destruction. Early osteomyelitis cannot be excluded. Electronically Signed   By: Malachy Moan M.D.   On: 06/13/2021 11:43    Procedures Procedures    Medications Ordered in ED Medications  oxyCODONE-acetaminophen (PERCOCET/ROXICET) 5-325 MG per tablet 1 tablet (1 tablet Oral Given 06/13/21 1258)  cephALEXin (KEFLEX) capsule 500 mg (500 mg Oral Given 06/13/21 1258)  sulfamethoxazole-trimethoprim (BACTRIM DS) 800-160 MG per tablet 1 tablet (1 tablet Oral Given 06/13/21 1258)    ED Course/ Medical Decision Making/ A&P                           Medical Decision Making He is presenting for evaluation of symptoms of toe infection following partial toenail removal 1 month ago.  Problems Addressed: Toe infection: acute illness or injury  Amount and/or Complexity of Data Reviewed External Data Reviewed: notes.    Details: Partial toenail removal by podiatry 1 month ago, Dr. Ralene Cork Labs: ordered.    Details: CBC and a metabolic panel normal. Radiology: ordered.    Details: To x-ray does not indicate distinct evidence for fracture or definite osteomyelitis.  Risk Prescription drug management. Risk Details: Patient with toe infection, and reassuring examination and evaluation in the ED.  Oral antibiotics started.  Pain medicine  given.  He is stable for outpatient management with follow-up, with his podiatrist within 1 week.  At time additional determination can be made including assessment for possible surgical intervention.  I did not anticipate that he has a systemic infection or will lose this digit.           Final Clinical Impression(s) / ED Diagnoses Final diagnoses:  Toe infection    Rx / DC Orders ED Discharge Orders          Ordered    cephALEXin (KEFLEX) 500 MG capsule  4 times daily        06/13/21 1329    HYDROcodone-acetaminophen (NORCO) 5-325 MG tablet  Every 4 hours PRN        06/13/21 1329    sulfamethoxazole-trimethoprim (BACTRIM DS) 800-160 MG tablet  2 times daily        06/13/21 1329              Mancel Bale, MD 06/13/21 1908

## 2021-06-13 NOTE — Discharge Instructions (Signed)
To help the wound on your toe heal, soak it in warm water, for 30 minutes, and scrub well with soap, 3 times a day.  After that rinse well, dry, and put a Band-Aid on it.  Call your podiatrist for a follow-up appointment to be seen for a checkup as soon as possible

## 2021-06-13 NOTE — ED Triage Notes (Signed)
Right toe pain, states he had toenail removed a month ago and now has a wound in that area and severe pain

## 2021-06-14 DIAGNOSIS — Z0279 Encounter for issue of other medical certificate: Secondary | ICD-10-CM

## 2021-06-16 NOTE — Telephone Encounter (Signed)
Called patient forms ready, asked to faxed and pick up a copy.  Forms were faxed.

## 2021-06-20 ENCOUNTER — Ambulatory Visit (INDEPENDENT_AMBULATORY_CARE_PROVIDER_SITE_OTHER): Payer: Medicare Other

## 2021-06-20 ENCOUNTER — Ambulatory Visit (INDEPENDENT_AMBULATORY_CARE_PROVIDER_SITE_OTHER): Payer: Medicare Other | Admitting: Podiatry

## 2021-06-20 ENCOUNTER — Encounter: Payer: Self-pay | Admitting: Podiatry

## 2021-06-20 ENCOUNTER — Other Ambulatory Visit: Payer: Self-pay

## 2021-06-20 DIAGNOSIS — R52 Pain, unspecified: Secondary | ICD-10-CM

## 2021-06-20 DIAGNOSIS — G609 Hereditary and idiopathic neuropathy, unspecified: Secondary | ICD-10-CM | POA: Diagnosis not present

## 2021-06-20 DIAGNOSIS — L97512 Non-pressure chronic ulcer of other part of right foot with fat layer exposed: Secondary | ICD-10-CM | POA: Diagnosis not present

## 2021-06-20 MED ORDER — DOXYCYCLINE MONOHYDRATE 100 MG PO CAPS: 100.0000 mg | ORAL_CAPSULE | Freq: Two times a day (BID) | ORAL | 0 refills | Status: AC

## 2021-06-20 MED ORDER — TRAMADOL HCL 50 MG PO TABS
50.0000 mg | ORAL_TABLET | Freq: Three times a day (TID) | ORAL | 0 refills | Status: AC | PRN
Start: 2021-06-20 — End: 2021-06-25

## 2021-06-20 NOTE — Progress Notes (Signed)
°  Subjective:  Patient ID: Paul Madden, male    DOB: Oct 19, 1955,   MRN: [id number]  Chief Complaint  Patient presents with   Toe Pain    Great toe on right foot is swollen,inflamed has drainage. Patients states that his pain level is 10/10.     66 y.o. male presents for concern of right great toe. Patient underwent right hallux nail removal several weeks ago and had healed well. Return today because it had been infected and was seen in the ED and sent back here. Relates significant pain in the toe. Has finished his course of antibiotics. X-rays in ED were negative.  . Denies any other pedal complaints. Denies n/v/f/c.   Past Medical History:  Diagnosis Date   Chronic low back pain    Hypertension     Objective:  Physical Exam: Vascular: DP/PT pulses 2/4 bilateral. CFT <3 seconds. Normal hair growth on digits. No edema.  Skin. No lacerations or abrasions bilateral feet. Ulcer right hallux measuring 1.5 cm x 1 cm x 0.2 cm to distal lateral hallux nail bed with granular base. Mild maceration surrounding. Very tender to touch.  Musculoskeletal: MMT 5/5 bilateral lower extremities in DF, PF, Inversion and Eversion. Deceased ROM in DF of ankle joint.  Neurological: Sensation intact to light touch.   Assessment:   1. 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 Ulcer right hallux nail bed with fat layer exposed.  -No debridement necessary  -X-rays reviewed and no osseous erosions noted.  -Dressed with betadine, DSD. -Off-loading with surgical shoe. -Doxycyclcine and tramadol sent to pharmacy.   -Discussed glucose control and proper protein-rich diet.  -Discussed if any worsening redness, pain, fever or chills to call or may need to report to the emergency room. Patient expressed understanding.  Follow up in 2 weeks for wound check.     Return in about 2 weeks (around 07/04/2021) for wound  check.   Louann Sjogren, DPM

## 2021-06-22 ENCOUNTER — Other Ambulatory Visit: Payer: Self-pay | Admitting: Podiatry

## 2021-06-22 DIAGNOSIS — R52 Pain, unspecified: Secondary | ICD-10-CM

## 2021-06-22 DIAGNOSIS — L97512 Non-pressure chronic ulcer of other part of right foot with fat layer exposed: Secondary | ICD-10-CM

## 2021-07-05 ENCOUNTER — Ambulatory Visit: Payer: Medicare Other | Admitting: Podiatry

## 2021-07-10 ENCOUNTER — Ambulatory Visit (INDEPENDENT_AMBULATORY_CARE_PROVIDER_SITE_OTHER): Payer: Medicare Other | Admitting: Podiatry

## 2021-07-10 ENCOUNTER — Encounter: Payer: Self-pay | Admitting: Podiatry

## 2021-07-10 ENCOUNTER — Other Ambulatory Visit: Payer: Self-pay

## 2021-07-10 DIAGNOSIS — L97512 Non-pressure chronic ulcer of other part of right foot with fat layer exposed: Secondary | ICD-10-CM | POA: Diagnosis not present

## 2021-07-10 NOTE — Progress Notes (Signed)
°  Subjective:  Patient ID: Paul Madden, male    DOB: 06-26-1955,   MRN: [id number]  Chief Complaint  Patient presents with   Foot Ulcer    Right hallux wound check  Pt stated that he still has some soreness     66 y.o. male presents for follow up of right hallux wound. Relates it is doing better and not as sore. Has been dressing with neosporin and not betadine. Has not been wearing surgical shoe. Relates he finished antiboitics. . Denies any other pedal complaints. Denies n/v/f/c.   Past Medical History:  Diagnosis Date   Chronic low back pain    Hypertension     Objective:  Physical Exam: Vascular: DP/PT pulses 2/4 bilateral. CFT <3 seconds. Normal hair growth on digits. No edema.  Skin. No lacerations or abrasions bilateral feet. Ulcer right hallux measuring 2 cm x 1 cm x 0.1 cm to distal lateral hallux nail bed with granular base. Mild maceration surrounding. Very tender to touch.  Musculoskeletal: MMT 5/5 bilateral lower extremities in DF, PF, Inversion and Eversion. Deceased ROM in DF of ankle joint.  Neurological: Sensation intact to light touch.   Assessment:   1. 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 Ulcer right hallux nail bed with fat layer exposed.  -No debridement necessary. -X-rays reviewed and no osseous erosions noted.  -Dressed with betadine, DSD. -Off-loading with surgical shoe. Discussed wearing surgical shoe.  -No abx necessary  -Discussed glucose control and proper protein-rich diet.  -Discussed if any worsening redness, pain, fever or chills to call or may need to report to the emergency room. Patient expressed understanding.  Follow up in 2 weeks for wound check.     Return in about 2 weeks (around 07/24/2021) for wound check.   Louann Sjogren, DPM

## 2021-07-24 ENCOUNTER — Ambulatory Visit: Payer: Medicare Other | Admitting: Podiatry

## 2021-08-02 ENCOUNTER — Encounter: Payer: Self-pay | Admitting: Podiatry

## 2021-08-02 ENCOUNTER — Ambulatory Visit (INDEPENDENT_AMBULATORY_CARE_PROVIDER_SITE_OTHER): Payer: Medicare Other | Admitting: Podiatry

## 2021-08-02 ENCOUNTER — Other Ambulatory Visit: Payer: Self-pay

## 2021-08-02 DIAGNOSIS — G609 Hereditary and idiopathic neuropathy, unspecified: Secondary | ICD-10-CM | POA: Diagnosis not present

## 2021-08-02 DIAGNOSIS — L97512 Non-pressure chronic ulcer of other part of right foot with fat layer exposed: Secondary | ICD-10-CM | POA: Diagnosis not present

## 2021-08-02 NOTE — Progress Notes (Signed)
?  Subjective:  ?Patient ID: Paul Madden, male    DOB: [birth year],   MRN: [id number] ? ?Chief Complaint  ?Patient presents with  ? Wound Check  ?  Right foot wound check  ? ? ?66 y.o. male presents for follow up of right hallux wound. Relates it has worsened a bit and been more painful.  Denies any other pedal complaints. Denies n/v/f/c.  ? ?Past Medical History:  ?Diagnosis Date  ? Chronic low back pain   ? Hypertension   ? ? ?Objective:  ?Physical Exam: ?Vascular: DP/PT pulses 2/4 bilateral. CFT <3 seconds. Normal hair growth on digits. No edema.  ?Skin. No lacerations or abrasions bilateral feet. Ulcer right hallux measuring 2 cm x 0.7 cm x 0.3 cm to distal lateral hallux nail bed with granular base. Mild maceration surrounding. Very tender to touch.  ?Musculoskeletal: MMT 5/5 bilateral lower extremities in DF, PF, Inversion and Eversion. Deceased ROM in DF of ankle joint.  ?Neurological: Sensation intact to light touch.  ? ?Assessment:  ? ?1. 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 Idiopathic peripheral neuropathy   ? ? ? ? ? ?Plan:  ?Patient was evaluated and treated and all questions answered. ?Ulcer right hallux nail bed with fat layer exposed.  ?-No debridement necessary. ?-X-rays reviewed and no osseous erosions noted.  ?-Dressed with betadine, DSD. ?-Off-loading with surgical shoe. Discussed wearing surgical shoe.  ?-No abx necessary  ?-Discussed glucose control and proper protein-rich diet.  ?-Discussed if any worsening redness, pain, fever or chills to call or may need to report to the emergency room. Patient expressed understanding.  ?Follow up in 2 weeks for wound check.  ? ? ? ?Return in about 2 weeks (around 08/16/2021) for wound check. ? ? ?Louann Sjogren, DPM  ? ? ?

## 2021-08-09 DIAGNOSIS — Z1152 Encounter for screening for COVID-19: Secondary | ICD-10-CM | POA: Diagnosis not present

## 2021-08-16 ENCOUNTER — Ambulatory Visit (INDEPENDENT_AMBULATORY_CARE_PROVIDER_SITE_OTHER): Payer: Medicare Other | Admitting: Podiatry

## 2021-08-16 ENCOUNTER — Encounter: Payer: Self-pay | Admitting: Podiatry

## 2021-08-16 ENCOUNTER — Other Ambulatory Visit: Payer: Self-pay

## 2021-08-16 DIAGNOSIS — Z1152 Encounter for screening for COVID-19: Secondary | ICD-10-CM | POA: Diagnosis not present

## 2021-08-16 DIAGNOSIS — L97512 Non-pressure chronic ulcer of other part of right foot with fat layer exposed: Secondary | ICD-10-CM | POA: Diagnosis not present

## 2021-08-16 DIAGNOSIS — G609 Hereditary and idiopathic neuropathy, unspecified: Secondary | ICD-10-CM | POA: Diagnosis not present

## 2021-08-16 NOTE — Progress Notes (Signed)
?  Subjective:  ?Patient ID: Paul Madden, male    DOB: Mar 24, 1956,   MRN: [id number] ? ?No chief complaint on file. ? ? ?66 y.o. male presents for follow up of right hallux wound. Relates it is doing better but thinks there is more white stuff present. .  Denies any other pedal complaints. Denies n/v/f/c.  ? ?Past Medical History:  ?Diagnosis Date  ? Chronic low back pain   ? Hypertension   ? ? ?Objective:  ?Physical Exam: ?Vascular: DP/PT pulses 2/4 bilateral. CFT <3 seconds. Normal hair growth on digits. No edema.  ?Skin. No lacerations or abrasions bilateral feet. Ulcer right hallux measuring 2 cm x 0.7 cm x 0.3 cm to distal lateral hallux nail bed with granular fibrotic base. Mild maceration surrounding. Very tender to touch.  ?Musculoskeletal: MMT 5/5 bilateral lower extremities in DF, PF, Inversion and Eversion. Deceased ROM in DF of ankle joint.  ?Neurological: Sensation intact to light touch.  ? ?Assessment:  ? ?1. 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 Idiopathic peripheral neuropathy   ? ? ? ? ? ?Plan:  ?Patient was evaluated and treated and all questions answered. ?Ulcer right hallux nail bed with fat layer exposed.  ?-Debridement as below.  ?-X-rays reviewed and no osseous erosions noted.  ?-Dressed with betadine, DSD. ?-Off-loading with surgical shoe. Discussed wearing surgical shoe.  ?-No abx necessary  ?-New surgical shoe provided.  ?-Discussed glucose control and proper protein-rich diet.  ?-Discussed if any worsening redness, pain, fever or chills to call or may need to report to the emergency room. Patient expressed understanding.  ? ? ?Procedure: Excisional Debridement of Wound ?Rationale: Removal of non-viable soft tissue from the wound to promote healing.  ?Anesthesia: none ?Pre-Debridement Wound Measurements: 2 cm x 0.7 cm x 0.2 cm  ?Post-Debridement Wound Measurements: 2 cm x 0.7 cm x 0.3 cm  ?Type of Debridement: Sharp Excisional ?Tissue Removed: Non-viable soft  tissue ?Depth of Debridement: subcutaneous tissue. ?Technique: Sharp excisional debridement to bleeding, viable wound base.  ?Dressing: Dry, sterile, compression dressing. ?Disposition: Patient tolerated procedure well. Patient to return in 2 week for follow-up. ? ?Return in about 2 weeks (around 08/30/2021) for wound check. ? ? ? ? ?No follow-ups on file. ? ? ?Louann Sjogren, DPM  ? ? ?

## 2021-08-30 ENCOUNTER — Ambulatory Visit: Payer: Medicare Other | Admitting: Podiatry

## 2021-09-13 ENCOUNTER — Encounter: Payer: Self-pay | Admitting: Podiatry

## 2021-09-13 ENCOUNTER — Ambulatory Visit (INDEPENDENT_AMBULATORY_CARE_PROVIDER_SITE_OTHER): Payer: Medicare Other | Admitting: Podiatry

## 2021-09-13 DIAGNOSIS — L97512 Non-pressure chronic ulcer of other part of right foot with fat layer exposed: Secondary | ICD-10-CM

## 2021-09-13 DIAGNOSIS — G609 Hereditary and idiopathic neuropathy, unspecified: Secondary | ICD-10-CM

## 2021-09-13 NOTE — Progress Notes (Signed)
?  Subjective:  ?Patient ID: Paul Madden, male    DOB: May 08, 1956,   MRN: [id number] ? ?Chief Complaint  ?Patient presents with  ? Foot Ulcer  ?  The right big toe is sore at the nail and does drain and the shoes do help and touchy and I do was it and use betadine on it   ? ? ?66 y.o. male presents for follow up of right hallux wound. Relates it is doing better has been dressing as instructed but still getting significant amount of pain. Denies any other pedal complaints. Denies n/v/f/c.  ? ?Past Medical History:  ?Diagnosis Date  ? Chronic low back pain   ? Hypertension   ? ? ?Objective:  ?Physical Exam: ?Vascular: DP/PT pulses 2/4 bilateral. CFT <3 seconds. Normal hair growth on digits. No edema.  ?Skin. No lacerations or abrasions bilateral feet. Ulcer right hallux measuring 0.5 cm x 0.7 cm x 0.2 cm to distal lateral hallux nail bed with granular fibrotic base. Mild maceration surrounding. Very tender to touch. Is improved. ?Musculoskeletal: MMT 5/5 bilateral lower extremities in DF, PF, Inversion and Eversion. Deceased ROM in DF of ankle joint.  ?Neurological: Sensation intact to light touch.  ? ?Assessment:  ? ?1. 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 Idiopathic peripheral neuropathy   ? ? ? ? ? ? ?Plan:  ?Patient was evaluated and treated and all questions answered. ?Ulcer right hallux nail bed with fat layer exposed.- improving  ?-Refused debridement today and did not want to do any local anesthesia.  ?-X-rays reviewed   ?-Dressed with betadine, DSD. ?-Off-loading with surgical shoe. Discussed wearing surgical shoe.  ?-No abx necessary .  ?-Discussed glucose control and proper protein-rich diet.  ?-Discussed if any worsening redness, pain, fever or chills to call or may need to report to the emergency room. Patient expressed understanding.  ? ? ? ? ?Return in about 2 weeks (around 09/27/2021) for wound check. ? ? ? ? ?Return in about 2 weeks (around 09/27/2021) for wound  check. ? ? ?Louann Sjogren, DPM  ? ? ?

## 2021-09-15 DIAGNOSIS — Z1152 Encounter for screening for COVID-19: Secondary | ICD-10-CM | POA: Diagnosis not present

## 2021-09-26 ENCOUNTER — Ambulatory Visit: Payer: Medicaid Other | Admitting: Internal Medicine

## 2021-09-27 ENCOUNTER — Ambulatory Visit: Payer: Medicare Other | Admitting: Podiatry

## 2021-10-03 ENCOUNTER — Encounter: Payer: Medicare Other | Admitting: Internal Medicine

## 2021-10-11 ENCOUNTER — Telehealth: Payer: Self-pay | Admitting: Podiatry

## 2021-10-11 ENCOUNTER — Ambulatory Visit (INDEPENDENT_AMBULATORY_CARE_PROVIDER_SITE_OTHER): Payer: Medicare Other | Admitting: Podiatry

## 2021-10-11 ENCOUNTER — Encounter: Payer: Self-pay | Admitting: Podiatry

## 2021-10-11 ENCOUNTER — Ambulatory Visit (INDEPENDENT_AMBULATORY_CARE_PROVIDER_SITE_OTHER): Payer: Medicare Other

## 2021-10-11 DIAGNOSIS — L97521 Non-pressure chronic ulcer of other part of left foot limited to breakdown of skin: Secondary | ICD-10-CM

## 2021-10-11 DIAGNOSIS — L97514 Non-pressure chronic ulcer of other part of right foot with necrosis of bone: Secondary | ICD-10-CM

## 2021-10-11 DIAGNOSIS — L97512 Non-pressure chronic ulcer of other part of right foot with fat layer exposed: Secondary | ICD-10-CM | POA: Diagnosis not present

## 2021-10-11 DIAGNOSIS — G609 Hereditary and idiopathic neuropathy, unspecified: Secondary | ICD-10-CM | POA: Diagnosis not present

## 2021-10-11 MED ORDER — TRAMADOL HCL 50 MG PO TABS: 50.0000 mg | ORAL_TABLET | Freq: Three times a day (TID) | ORAL | 0 refills | Status: AC | PRN

## 2021-10-11 MED ORDER — DOXYCYCLINE HYCLATE 100 MG PO TABS: 100.0000 mg | ORAL_TABLET | Freq: Two times a day (BID) | ORAL | 0 refills | Status: DC

## 2021-10-11 NOTE — Progress Notes (Signed)
?  Subjective:  ?Patient ID: Paul Madden, male    DOB: 07/09/1955,   MRN: [id number] ? ?No chief complaint on file. ? ? ?66 y.o. male presents for follow up of right hallux wound. Relates  some drainage and concerned the tip is turning black still having a lot of pain. . Denies any other pedal complaints. Denies n/v/f/c.  ? ?Past Medical History:  ?Diagnosis Date  ? Chronic low back pain   ? Hypertension   ? ? ?Objective:  ?Physical Exam: ?Vascular: DP/PT pulses 2/4 bilateral. CFT <3 seconds. Normal hair growth on digits. No edema.  ?Skin. No lacerations or abrasions bilateral feet. Ulcer right hallux measuring 3 cm x 4 cm x 0.5 cm to distal lateral hallux nail bed with necrotic base and draniage. Malodor present. Distal hallux bone exposed. . Mild maceration surrounding. Very tender to touch.  ?Musculoskeletal: MMT 5/5 bilateral lower extremities in DF, PF, Inversion and Eversion. Deceased ROM in DF of ankle joint.  ?Neurological: Sensation intact to light touch.  ? ?Assessment:  ? ?1. 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 Idiopathic peripheral neuropathy   ? ? ? ? ? ? ? ?Plan:  ?Patient was evaluated and treated and all questions answered. ?Ulcer right hallux nail bed with necrosis of bone.- worsening necrotic tissue and infection. ?-X-rays reviewed Cortical erosion noted to plantar distal hallux. Concerning for osteomyelitis.  ?-Debridement as below. ?-Dressed with betadine, DSD. ?-Off-loading with surgical shoe. ?-Doxycycline sent to pharmacy.  ?-Discussed with patient will likely need amputation of the partial hallux. Will get blood flow studies to evaluate healing potential first.  ?Patient expressed understanding.  ?-Referral for ABIs.  ?-Tramadol sent for pain  ?-Discussed glucose control and proper protein-rich diet.  ?-Discussed if any worsening redness, pain, fever or chills to call or may need to report to the emergency room. Patient expressed understanding.  ? ?Procedure:  Excisional Debridement of Wound ?Rationale: Removal of non-viable soft tissue from the wound to promote healing.  ?Anesthesia: none ?Pre-Debridement Wound Measurements: Overyling necrosis  ?Post-Debridement Wound Measurements: 3 cm x 4 cm x 0.5 cm  ?Type of Debridement: Sharp Excisional ?Tissue Removed: Non-viable soft tissue ?Depth of Debridement: subcutaneous tissue. ?Technique: Sharp excisional debridement to bleeding, viable wound base.  ?Dressing: Dry, sterile, compression dressing. ?Disposition: Patient tolerated procedure well. Patient to return in 2 week for follow-up. ? ?Return in about 2 weeks (around 10/25/2021) for wound check. ? ? ? ? ? ?Return in about 2 weeks (around 10/25/2021) for wound check. ? ? ? ? ?Return in about 2 weeks (around 10/25/2021) for wound check. ? ? ?Louann Sjogren, DPM  ? ? ?

## 2021-10-11 NOTE — Telephone Encounter (Signed)
Secure chat to Dr Blenda Mounts: ? ?Hi I have the vein & vascular office on the phone and they had concerns with getting the pt in for a STAT order due to his medicaid cant setup transportation without a 3 working day notice not counting the day you set it up. Would this delay be okay for the order to get done?  ? ?Paul Madden can be reached back in [employer] or directly at [phone]. I will also create a chart message.  ? ?Yes the delay will be ok as long as we get it in the next couple weeks. Thank you ?

## 2021-10-19 ENCOUNTER — Encounter (HOSPITAL_COMMUNITY): Payer: Self-pay | Admitting: Emergency Medicine

## 2021-10-19 ENCOUNTER — Emergency Department (HOSPITAL_COMMUNITY): Payer: Medicare Other

## 2021-10-19 ENCOUNTER — Other Ambulatory Visit: Payer: Self-pay

## 2021-10-19 ENCOUNTER — Ambulatory Visit (INDEPENDENT_AMBULATORY_CARE_PROVIDER_SITE_OTHER)
Admission: RE | Admit: 2021-10-19 | Discharge: 2021-10-19 | Disposition: A | Discharge status: Home / Self Care | Payer: Medicare Other | Source: Ambulatory Visit | Attending: Podiatry | Admitting: Podiatry

## 2021-10-19 ENCOUNTER — Telehealth: Payer: Self-pay | Admitting: *Deleted

## 2021-10-19 ENCOUNTER — Inpatient Hospital Stay (HOSPITAL_COMMUNITY)
Admission: EM | Admit: 2021-10-19 | Discharge: 2021-10-26 | DRG: 239 | Disposition: A | Discharge status: Home Health | Payer: Medicare Other | Attending: Family Medicine | Admitting: Family Medicine

## 2021-10-19 DIAGNOSIS — M869 Osteomyelitis, unspecified: Secondary | ICD-10-CM | POA: Diagnosis not present

## 2021-10-19 DIAGNOSIS — I745 Embolism and thrombosis of iliac artery: Secondary | ICD-10-CM | POA: Diagnosis present

## 2021-10-19 DIAGNOSIS — I774 Celiac artery compression syndrome: Secondary | ICD-10-CM | POA: Diagnosis not present

## 2021-10-19 DIAGNOSIS — Z91199 Patient's noncompliance with other medical treatment and regimen due to unspecified reason: Secondary | ICD-10-CM | POA: Diagnosis not present

## 2021-10-19 DIAGNOSIS — I70221 Atherosclerosis of native arteries of extremities with rest pain, right leg: Principal | ICD-10-CM

## 2021-10-19 DIAGNOSIS — F1010 Alcohol abuse, uncomplicated: Secondary | ICD-10-CM | POA: Diagnosis present

## 2021-10-19 DIAGNOSIS — G609 Hereditary and idiopathic neuropathy, unspecified: Secondary | ICD-10-CM | POA: Diagnosis not present

## 2021-10-19 DIAGNOSIS — Z720 Tobacco use: Secondary | ICD-10-CM | POA: Diagnosis present

## 2021-10-19 DIAGNOSIS — Z789 Other specified health status: Secondary | ICD-10-CM | POA: Diagnosis present

## 2021-10-19 DIAGNOSIS — F1410 Cocaine abuse, uncomplicated: Secondary | ICD-10-CM | POA: Diagnosis present

## 2021-10-19 DIAGNOSIS — M868X7 Other osteomyelitis, ankle and foot: Secondary | ICD-10-CM | POA: Diagnosis present

## 2021-10-19 DIAGNOSIS — F17210 Nicotine dependence, cigarettes, uncomplicated: Secondary | ICD-10-CM | POA: Diagnosis not present

## 2021-10-19 DIAGNOSIS — I70223 Atherosclerosis of native arteries of extremities with rest pain, bilateral legs: Secondary | ICD-10-CM | POA: Diagnosis not present

## 2021-10-19 DIAGNOSIS — E43 Unspecified severe protein-calorie malnutrition: Secondary | ICD-10-CM | POA: Diagnosis not present

## 2021-10-19 DIAGNOSIS — I739 Peripheral vascular disease, unspecified: Secondary | ICD-10-CM | POA: Diagnosis not present

## 2021-10-19 DIAGNOSIS — I82402 Acute embolism and thrombosis of unspecified deep veins of left lower extremity: Secondary | ICD-10-CM | POA: Diagnosis not present

## 2021-10-19 DIAGNOSIS — F1090 Alcohol use, unspecified, uncomplicated: Secondary | ICD-10-CM | POA: Diagnosis present

## 2021-10-19 DIAGNOSIS — M5136 Other intervertebral disc degeneration, lumbar region: Secondary | ICD-10-CM | POA: Diagnosis not present

## 2021-10-19 DIAGNOSIS — L97514 Non-pressure chronic ulcer of other part of right foot with necrosis of bone: Secondary | ICD-10-CM | POA: Insufficient documentation

## 2021-10-19 DIAGNOSIS — I10 Essential (primary) hypertension: Secondary | ICD-10-CM | POA: Diagnosis not present

## 2021-10-19 DIAGNOSIS — M79674 Pain in right toe(s): Secondary | ICD-10-CM | POA: Diagnosis not present

## 2021-10-19 DIAGNOSIS — Z8679 Personal history of other diseases of the circulatory system: Secondary | ICD-10-CM | POA: Diagnosis not present

## 2021-10-19 DIAGNOSIS — K4090 Unilateral inguinal hernia, without obstruction or gangrene, not specified as recurrent: Secondary | ICD-10-CM | POA: Diagnosis not present

## 2021-10-19 DIAGNOSIS — Z01810 Encounter for preprocedural cardiovascular examination: Secondary | ICD-10-CM | POA: Diagnosis not present

## 2021-10-19 DIAGNOSIS — I70261 Atherosclerosis of native arteries of extremities with gangrene, right leg: Principal | ICD-10-CM | POA: Diagnosis present

## 2021-10-19 DIAGNOSIS — M79671 Pain in right foot: Secondary | ICD-10-CM | POA: Diagnosis not present

## 2021-10-19 DIAGNOSIS — Z681 Body mass index (BMI) 19 or less, adult: Secondary | ICD-10-CM | POA: Diagnosis not present

## 2021-10-19 DIAGNOSIS — L97519 Non-pressure chronic ulcer of other part of right foot with unspecified severity: Secondary | ICD-10-CM | POA: Diagnosis not present

## 2021-10-19 DIAGNOSIS — M1990 Unspecified osteoarthritis, unspecified site: Secondary | ICD-10-CM | POA: Diagnosis not present

## 2021-10-19 DIAGNOSIS — Z79899 Other long term (current) drug therapy: Secondary | ICD-10-CM | POA: Diagnosis not present

## 2021-10-19 DIAGNOSIS — F1910 Other psychoactive substance abuse, uncomplicated: Secondary | ICD-10-CM | POA: Diagnosis not present

## 2021-10-19 DIAGNOSIS — I70235 Atherosclerosis of native arteries of right leg with ulceration of other part of foot: Secondary | ICD-10-CM | POA: Diagnosis not present

## 2021-10-19 LAB — CBC WITH DIFFERENTIAL/PLATELET
Abs Immature Granulocytes: 0.05 10*3/uL (ref 0.00–0.07)
Basophils Absolute: 0 10*3/uL (ref 0.0–0.1)
Basophils Relative: 0 %
Eosinophils Absolute: 0.2 10*3/uL (ref 0.0–0.5)
Eosinophils Relative: 3 %
HCT: 40 % (ref 39.0–52.0)
Hemoglobin: 12.9 g/dL — ABNORMAL LOW (ref 13.0–17.0)
Immature Granulocytes: 1 %
Lymphocytes Relative: 15 %
Lymphs Abs: 1.1 10*3/uL (ref 0.7–4.0)
MCH: 28.4 pg (ref 26.0–34.0)
MCHC: 32.3 g/dL (ref 30.0–36.0)
MCV: 87.9 fL (ref 80.0–100.0)
Monocytes Absolute: 0.5 10*3/uL (ref 0.1–1.0)
Monocytes Relative: 6 %
Neutro Abs: 5.7 10*3/uL (ref 1.7–7.7)
Neutrophils Relative %: 75 %
Platelets: 428 10*3/uL — ABNORMAL HIGH (ref 150–400)
RBC: 4.55 MIL/uL (ref 4.22–5.81)
RDW: 14.8 % (ref 11.5–15.5)
WBC: 7.6 10*3/uL (ref 4.0–10.5)
nRBC: 0 % (ref 0.0–0.2)

## 2021-10-19 LAB — COMPREHENSIVE METABOLIC PANEL
ALT: 18 U/L (ref 0–44)
AST: 26 U/L (ref 15–41)
Albumin: 3.8 g/dL (ref 3.5–5.0)
Alkaline Phosphatase: 91 U/L (ref 38–126)
Anion gap: 6 (ref 5–15)
BUN: 22 mg/dL (ref 8–23)
CO2: 25 mmol/L (ref 22–32)
Calcium: 9.2 mg/dL (ref 8.9–10.3)
Chloride: 103 mmol/L (ref 98–111)
Creatinine, Ser: 1.09 mg/dL (ref 0.61–1.24)
GFR, Estimated: >60 mL/min (ref >60)
Glucose, Bld: 86 mg/dL (ref 70–99)
Potassium: 4.8 mmol/L (ref 3.5–5.1)
Sodium: 134 mmol/L — ABNORMAL LOW (ref 135–145)
Total Bilirubin: 0.3 mg/dL (ref 0.3–1.2)
Total Protein: 7.5 g/dL (ref 6.5–8.1)

## 2021-10-19 LAB — CREATININE, SERUM
Creatinine, Ser: 1.23 mg/dL (ref 0.61–1.24)
GFR, Estimated: >60 mL/min (ref >60)

## 2021-10-19 MED ORDER — OXYCODONE HCL 5 MG PO TABS
5.0000 mg | ORAL_TABLET | ORAL | Status: DC | PRN
  Administered 2021-10-19 – 2021-10-21 (×5): 5 mg via ORAL
  Filled 2021-10-19 (×5): qty 1

## 2021-10-19 MED ORDER — LORAZEPAM 1 MG PO TABS: 0.0000 mg | ORAL_TABLET | Freq: Four times a day (QID) | ORAL | Status: DC

## 2021-10-19 MED ORDER — ONDANSETRON HCL 4 MG PO TABS: 4.0000 mg | ORAL_TABLET | Freq: Four times a day (QID) | ORAL | Status: DC | PRN

## 2021-10-19 MED ORDER — NICOTINE 21 MG/24HR TD PT24
21.0000 mg | MEDICATED_PATCH | Freq: Every day | TRANSDERMAL | Status: DC
  Filled 2021-10-19 (×4): qty 1

## 2021-10-19 MED ORDER — HYDRALAZINE HCL 20 MG/ML IJ SOLN: 10.0000 mg | Freq: Four times a day (QID) | INTRAMUSCULAR | Status: DC | PRN

## 2021-10-19 MED ORDER — HEPARIN SODIUM (PORCINE) 5000 UNIT/ML IJ SOLN
5000.0000 [IU] | Freq: Three times a day (TID) | INTRAMUSCULAR | Status: DC
  Administered 2021-10-19 – 2021-10-26 (×19): 5000 [IU] via SUBCUTANEOUS
  Filled 2021-10-19 (×19): qty 1

## 2021-10-19 MED ORDER — THIAMINE HCL 100 MG PO TABS
100.0000 mg | ORAL_TABLET | Freq: Every day | ORAL | Status: DC
  Administered 2021-10-21 – 2021-10-26 (×5): 100 mg via ORAL
  Filled 2021-10-19 (×6): qty 1

## 2021-10-19 MED ORDER — ONDANSETRON HCL 4 MG/2ML IJ SOLN
4.0000 mg | Freq: Four times a day (QID) | INTRAMUSCULAR | Status: DC | PRN
  Administered 2021-10-24: 4 mg via INTRAVENOUS

## 2021-10-19 MED ORDER — MORPHINE SULFATE (PF) 2 MG/ML IV SOLN
2.0000 mg | INTRAVENOUS | Status: DC | PRN
  Administered 2021-10-19 – 2021-10-20 (×2): 2 mg via INTRAVENOUS
  Filled 2021-10-19 (×3): qty 1

## 2021-10-19 MED ORDER — ACETAMINOPHEN 325 MG PO TABS
650.0000 mg | ORAL_TABLET | Freq: Four times a day (QID) | ORAL | Status: DC | PRN
  Administered 2021-10-20 – 2021-10-23 (×12): 650 mg via ORAL
  Administered 2021-10-24: 1000 mg via ORAL
  Administered 2021-10-24 (×2): 650 mg via ORAL
  Filled 2021-10-19 (×16): qty 2

## 2021-10-19 MED ORDER — ACETAMINOPHEN 650 MG RE SUPP: 650.0000 mg | Freq: Four times a day (QID) | RECTAL | Status: DC | PRN

## 2021-10-19 MED ORDER — LORAZEPAM 1 MG PO TABS: 1.0000 mg | ORAL_TABLET | ORAL | Status: DC | PRN

## 2021-10-19 MED ORDER — LORAZEPAM 2 MG/ML IJ SOLN: 1.0000 mg | INTRAMUSCULAR | Status: DC | PRN

## 2021-10-19 MED ORDER — THIAMINE HCL 100 MG/ML IJ SOLN
100.0000 mg | Freq: Every day | INTRAMUSCULAR | Status: DC
  Administered 2021-10-20 – 2021-10-22 (×2): 100 mg via INTRAVENOUS
  Filled 2021-10-19 (×2): qty 2

## 2021-10-19 MED ORDER — FOLIC ACID 1 MG PO TABS
1.0000 mg | ORAL_TABLET | Freq: Every day | ORAL | Status: DC
  Administered 2021-10-20 – 2021-10-26 (×7): 1 mg via ORAL
  Filled 2021-10-19 (×7): qty 1

## 2021-10-19 MED ORDER — LORAZEPAM 1 MG PO TABS: 0.0000 mg | ORAL_TABLET | Freq: Two times a day (BID) | ORAL | Status: DC

## 2021-10-19 MED ORDER — ADULT MULTIVITAMIN W/MINERALS CH
1.0000 | ORAL_TABLET | Freq: Every day | ORAL | Status: DC
  Administered 2021-10-20 – 2021-10-26 (×7): 1 via ORAL
  Filled 2021-10-19 (×7): qty 1

## 2021-10-19 NOTE — Telephone Encounter (Addendum)
Helene w/ [facility]([phone]) is calling and the patient is there , his right great toe wound is necrotic, rest unbearable pain since 4 days, no flow to the right foot.  She wanted to know if he should be seen but does not want to go to [place] office. Advised that patient should go to the ER as soon as possible. Preliminary results are ready to view in epic.

## 2021-10-19 NOTE — Progress Notes (Addendum)
ABI performed 10/19/21 shows ischemic right foot.  Patient with rest pain x 4 days.  Reported to Paul Madden, triage RN at [facility].  After discussion, it was decided to sent Paul Madden to the ED at [facility].  Paul Madden is frustrated and is uncertain if Paul Madden transportation will take him to the ED.  I stressed the importance of his going to the ED as the only way to relieve his pain.  Paul Madden, RVT  CV Imaging at [facility].    Transportation is not allowed to take him to the ED.  Paul Madden assures me he will go to the [facility] ED.   Paul Madden, RVT

## 2021-10-19 NOTE — H&P (Signed)
History and Physical    Patient: Paul Madden DOB: [birth year] DOA: 10/19/2021 DOS: the patient was seen and examined on 10/19/2021 PCP: Anabel Halon, MD  Patient coming from: Home  Chief Complaint:  Chief Complaint  Patient presents with   Foot Pain   HPI: Paul Madden is a 66 y.o. male with medical history significant of with history of hypertension, tobacco use disorder, alcohol abuse, substance abuse, presents to ED with a chief complaint of foot pain.  Patient reports that he was sent from podiatry today.  He reports he has been having trouble with his right great toe ever since he had his toenail removed 5 or 6 months ago.  His main complaint is that sharp, 10 out of 10 pain.  It is worse with weightbearing.  It keeps him up at night.  It hurts to touch it, and has become so sensitive that it hurts to even put a sock on.  It has been draining serosanguineous fluid, intermittently for the last month.  Patient reports he has been putting iodine on it for wound care at home.  He has taken Tylenol and tramadol for the pain and the tramadol helps.  There is no radiation of the pain.  Patient reports that he started noticing it changing color and swelling of the toe 2 weeks ago.  The pain also got acutely worse 2 weeks ago, and has been progressively worse since.  Patient was started on doxycycline on May 17 and took his last dose today per his report.  He denies fever, numbness, weakness, or other symptoms at this time.  Patient had outpatient ABIs which showed critical limb ischemia.  Patient was advised to present to [facility] ER, but had difficulty with transportation so presented to [facility] instead.  Vascular was consulted from the ER and reports they will see patient but he needs to be in medicine admit to [facility].  Patient is a current pack-a-day smoker.  He drinks 4-6 beers per day.  His last cocaine use was the day prior to presentation.  Patient is full code. Review of  Systems: As mentioned in the history of present illness. All other systems reviewed and are negative. Past Medical History:  Diagnosis Date   Chronic low back pain    Hypertension    History reviewed. No pertinent surgical history. Social History:  reports that he has been smoking. He has never used smokeless tobacco. He reports current alcohol use. He reports that he does not use drugs.  No Known Allergies  History reviewed. No pertinent family history.  Prior to Admission medications   Medication Sig Start Date End Date Taking? Authorizing Provider  acetaminophen (TYLENOL 8 HOUR) 650 MG CR tablet Take 1 tablet (650 mg total) by mouth every 8 (eight) hours as needed for pain. 03/05/17  Yes Nanavati, Ankit, MD  amLODipine (NORVASC) 5 MG tablet Take 1 tablet (5 mg total) by mouth daily. 05/25/21  Yes Anabel Halon, MD  ibuprofen (ADVIL) 200 MG tablet Take 400 mg by mouth every 6 (six) hours as needed.   Yes [provider]  MULTIPLE VITAMINS PO Take by mouth.   Yes [provider]  doxycycline (VIBRA-TABS) 100 MG tablet Take 1 tablet (100 mg total) by mouth 2 (two) times daily for 14 days. Patient not taking: Reported on 10/19/2021 10/11/21 10/25/21  Louann Sjogren, DPM    Physical Exam: Vitals:   10/19/21 1436 10/19/21 1436 10/19/21 1707 10/19/21 1954  BP:  Marland Kitchen)  156/90 (!) 168/89 (!) 164/94  Pulse:  78 68 68  Resp:  16 18 18   Temp:  98.6 F (37 C)    TempSrc:  Oral    SpO2:  96% 96% 99%  Weight: 65.8 kg     Height: 5\' 9"  (1.753 m)      1.  General: Patient sitting on edge of bed,  no acute distress   2. Psychiatric: Alert and oriented x 3, mood and behavior normal for situation, pleasant and cooperative with exam   3. Neurologic: Speech and language are normal, face is symmetric, moves all 4 extremities voluntarily, equal sensation in the lower extremities bilaterally, at baseline without acute deficits on limited exam   4. HEENMT:  Head is atraumatic,  normocephalic, pupils reactive to light, neck is supple, trachea is midline, mucous membranes are moist   5. Respiratory : Lungs are clear to auscultation bilaterally without wheezing, rhonchi, rales, no cyanosis, no increase in work of breathing or accessory muscle use   6. Cardiovascular : Heart rate normal, rhythm is regular, no murmurs, rubs or gallops, no peripheral edema,    7. Gastrointestinal:  Abdomen is soft, nondistended, nontender to palpation bowel sounds active, no masses or organomegaly palpated   8. Skin:  Skin is warm, dry and intact without rashes, acute lesions, or ulcers on limited exam   9.Musculoskeletal:  Right great toe with notable skin breakdown, necrotic changes at the tip, edema, no asymmetry in tone, no peripheral edema, peripheral pulses could not be palpated in the lower extremities,, tenderness to palpation of the right great toe Data Reviewed: In the ED Temp 98.6, heart rate 60-78, respiratory rate 16-18, blood pressure 168/89, satting 96% No leukocytosis with a white blood cell count of 7.6, hemoglobin 12.9, platelets 428 CMP is unremarkable X-ray of right foot completed shows soft tissue loss of the distal phalanx great toe with loss of cortical margination at the tuft of the distal phalanx, suspicious for osteomyelitis ABIs show right lower extremity critical limb ischemia and left lower extremity critical limb ischemia No interventions in the ER Vascular consulted by the ER and agrees to see patient at [facility] Admission requested for critical limb ischemia   Assessment and Plan: * Critical limb ischemia of both lower extremities (HCC) - Outpatient ABIs show critical limb ischemia in the bilateral lower extremities -Patient is symptomatic in the right lower extremity -No known history of peripheral vascular disease, not on any aspirin or Pletal -Vascular consulted and will see patient at [facility] -Transferring to [facility] for escalation of  care with any [facility] does not have vascular available  Substance abuse (HCC) - Last cocaine use was the day prior to admission -Avoid beta-blockers  -Counseled on the importance of cessation -TOC consult -Continue to monitor  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 -Cover with vancomycin and Rocephin -Will need surgical consult at [facility] -ABIs of already been done and showed critical limb ischemia in the bilateral lower extremities -Vascular already consulted by ER  Alcohol use - Patient drinks 4-6 beers per day -Denies ever having withdrawals -CIWA protocol  Tobacco abuse - Counseled on the importance of cessation -Counseled on the association between cigarettes and peripheral vascular disease -1 pack/day smoker, 21 mg nicotine patch for cravings   Essential hypertension - Continue amlodipine -Avoid beta-blockers -As needed hydralazine      Advance Care Planning:   Code Status: Not on file full\  Consults: Vascular  Family  Communication: No family at bedside  Severity of Illness: The appropriate patient status for this patient is INPATIENT. Inpatient status is judged to be reasonable and necessary in order to provide the required intensity of service to ensure the patient's safety. The patient's presenting symptoms, physical exam findings, and initial radiographic and laboratory data in the context of their chronic comorbidities is felt to place them at high risk for further clinical deterioration. Furthermore, it is not anticipated that the patient will be medically stable for discharge from the hospital within 2 midnights of admission.   * I certify that at the point of admission it is my clinical judgment that the patient will require inpatient hospital care spanning beyond 2 midnights from the point of admission due to high intensity of service, high risk for further deterioration and high frequency of surveillance  required.*  Author: Lilyan Gilford, DO 10/19/2021 9:38 PM  For on call review [email or web address].

## 2021-10-19 NOTE — ED Notes (Signed)
Pt given sandwich bag and ginger ale.  

## 2021-10-19 NOTE — ED Triage Notes (Signed)
Pt seen at [place] at [facility] for necrotic toe on right foot today advised to go to [facility] ED, transport wouldn't take him to second stop, so he came to the ED for evaluation.

## 2021-10-19 NOTE — ED Provider Notes (Signed)
[facility] EMERGENCY DEPARTMENT Provider Note   CSN: [id number] Arrival date & time: 10/19/21  1404     History  Chief Complaint  Patient presents with   Foot Pain    Paul Madden is a 66 y.o. male.  Patient presents for evaluation of great toe on the right foot due to necrosis.  Patient was seen earlier today in [place] and tried foot and ankle and was advised to go into the [facility] emergency department for possible surgical consult but apparently his transportation would not take him to a second stop and he came to [facility] instead.  The patient complains of pain, black color of the great toe of the right foot which has been worsening recently.  The patient states that he has had problems with this toe since having a toenail removed in January of this year.  HPI     Home Medications Prior to Admission medications   Medication Sig Start Date End Date Taking? Authorizing Provider  acetaminophen (TYLENOL 8 HOUR) 650 MG CR tablet Take 1 tablet (650 mg total) by mouth every 8 (eight) hours as needed for pain. 03/05/17   Varney Biles, MD  amLODipine (NORVASC) 5 MG tablet Take 1 tablet (5 mg total) by mouth daily. 05/25/21   Lindell Spar, MD  cephALEXin (KEFLEX) 500 MG capsule Take 1 capsule (500 mg total) by mouth 4 (four) times daily. 06/13/21   Daleen Bo, MD  doxycycline (VIBRA-TABS) 100 MG tablet Take 1 tablet (100 mg total) by mouth 2 (two) times daily for 14 days. 10/11/21 10/25/21  Lorenda Peck, DPM  Ferrous Sulfate (IRON PO) Take by mouth.    [provider]  gabapentin (NEURONTIN) 100 MG capsule Take 1 capsule (100 mg total) by mouth 3 (three) times daily. 05/25/21   Lindell Spar, MD  HYDROcodone-acetaminophen (NORCO) 5-325 MG tablet Take 2 tablets by mouth every 4 (four) hours as needed for moderate pain or severe pain. 06/13/21   Daleen Bo, MD  MULTIPLE VITAMINS PO Take by mouth.    [provider]  naproxen (NAPROSYN) 500 MG tablet Take 1  tablet (500 mg total) by mouth 2 (two) times daily with a meal. 03/27/21   Posey Pronto, Colin Broach, MD  sulfamethoxazole-trimethoprim (BACTRIM DS) 800-160 MG tablet Take 1 tablet by mouth 2 (two) times daily. 06/13/21   Daleen Bo, MD      Allergies    Patient has no known allergies.    Review of Systems   Review of Systems  Constitutional:  Negative for fever.  Musculoskeletal:  Positive for arthralgias.   Physical Exam Updated Vital Signs BP (!) 164/94   Pulse 68   Temp 98.6 F (37 C) (Oral)   Resp 18   Ht 5\' 9"  (1.753 m)   Wt 65.8 kg   SpO2 99%   BMI 21.41 kg/m  Physical Exam Vitals and nursing note reviewed.  Constitutional:      General: He is not in acute distress.    Appearance: He is normal weight.  HENT:     Head: Normocephalic.  Eyes:     Conjunctiva/sclera: Conjunctivae normal.  Cardiovascular:     Rate and Rhythm: Normal rate and regular rhythm.     Heart sounds: Normal heart sounds.  Pulmonary:     Effort: Pulmonary effort is normal.     Breath sounds: Normal breath sounds.  Musculoskeletal:     Cervical back: Normal range of motion.     Comments: Great toe of  right foot as noted in images below.  Unable to palpate DP or TA pulses in right lower extremity  Neurological:     Mental Status: He is alert.     ED Results / Procedures / Treatments   Labs (all labs ordered are listed, but only abnormal results are displayed) Labs Reviewed  COMPREHENSIVE METABOLIC PANEL - Abnormal; Notable for the following components:      Result Value   Sodium 134 (*)    All other components within normal limits  CBC WITH DIFFERENTIAL/PLATELET - Abnormal; Notable for the following components:   Hemoglobin 12.9 (*)    Platelets 428 (*)    All other components within normal limits    EKG None  Radiology DG Foot Complete Right  Result Date: 10/19/2021 CLINICAL DATA:  Necrotic toe, possible infection in toe, pain RIGHT foot EXAM: RIGHT FOOT COMPLETE - 3+ VIEW  COMPARISON:  None FINDINGS: Soft tissue loss tip of distal phalanx great toe. Osseous demineralization. Joint spaces preserved. Loss of cortical margination at the tuft of distal phalanx great toe suspicious for osteomyelitis. No acute fracture, dislocation, or additional bone destruction. IMPRESSION: Soft tissue loss at distal phalanx great toe with loss of cortical margination at the tuft of the distal phalanx, suspicious for osteomyelitis. Electronically Signed   By: Lavonia Dana M.D.   On: 10/19/2021 15:05   VAS [place] ABI WITH/WO TBI  Result Date: 10/19/2021  LOWER EXTREMITY DOPPLER STUDY Patient Name:  Paul Madden  Date of Exam:   10/19/2021 Medical Rec #: [id number]    Accession #:    [id number] Date of Birth: 05/22/1956    Patient Gender: M Patient Age:   26 years Exam Location:  [facility] Procedure:      VAS [place] ABI WITH/WO TBI Referring Phys: [employer] --------------------------------------------------------------------------------  Indications: Rest pain, gangrene, and peripheral artery disease. High Risk Factors: Current smoker. Other Factors: Non healing great toe wound status post ingrown nail removal                approx 8 months agol. Rest pain for 4 days.  Performing Technologist: Ralene Cork RVT  Examination Guidelines: A complete evaluation includes at minimum, Doppler waveform signals and systolic blood pressure reading at the level of bilateral brachial, anterior tibial, and posterior tibial arteries, when vessel segments are accessible. Bilateral testing is considered an integral part of a complete examination. Photoelectric Plethysmograph (PPG) waveforms and toe systolic pressure readings are included as required and additional duplex testing as needed. Limited examinations for reoccurring indications may be performed as noted.  ABI Findings: +---------+------------------+-----+--------+--------+ Right    Rt Pressure (mmHg)IndexWaveformComment   +---------+------------------+-----+--------+--------+ Brachial 171                                     +---------+------------------+-----+--------+--------+ PTA      0                 0.00 absent           +---------+------------------+-----+--------+--------+ DP       0                 0.00 absent           +---------+------------------+-----+--------+--------+ Great Toe  necrotic +---------+------------------+-----+--------+--------+ +---------+------------------+-----+-------------------+-------+ Left     Lt Pressure (mmHg)IndexWaveform           Comment +---------+------------------+-----+-------------------+-------+ Brachial 162                                               +---------+------------------+-----+-------------------+-------+ PTA      112               0.65 dampened monophasic        +---------+------------------+-----+-------------------+-------+ DP       106               0.62 dampened monophasic        +---------+------------------+-----+-------------------+-------+ Great Toe9                 0.05                            +---------+------------------+-----+-------------------+-------+ +-------+-----------+-----------+------------+------------+ ABI/TBIToday's ABIToday's TBIPrevious ABIPrevious TBI +-------+-----------+-----------+------------+------------+ Right  0                                              +-------+-----------+-----------+------------+------------+ Left   0.65       0.05                                +-------+-----------+-----------+------------+------------+  No previous ABI.  Findings reported to Amy, triage RN at 11:20 am. Summary: Right: Resting right ankle-brachial index indicates critical limb ischemia. The right toe-brachial index is abnormal. Left: Resting left ankle-brachial index indicates moderate left lower extremity arterial disease. The left toe-brachial  index is abnormal. *See table(s) above for measurements and observations.  Electronically signed by Gerarda Fraction on 10/19/2021 at 5:07:25 PM.    Final     Procedures Procedures    Medications Ordered in ED Medications - No data to display  ED Course/ Medical Decision Making/ A&P                           Medical Decision Making Amount and/or Complexity of Data Reviewed Labs: ordered. Radiology: ordered.  Risk Decision regarding hospitalization.   This patient presents to the ED for concern of right toe pain, this involves an extensive number of treatment options, and is a complaint that carries with it a high risk of complications and morbidity.  The differential diagnosis includes limb ischemia, gangrene, osteomyelitis, and others   Co morbidities that complicate the patient evaluation  History of essential hypertension, ingrown nail of toe of right foot, tobacco use   Additional history obtained:  External records from outside source obtained and reviewed including podiatry notes over the past few months describing issues with right lower extremity   Lab Tests:  I Ordered, and personally interpreted labs.  The pertinent results include: Hemoglobin 12.9, sodium 134   Imaging Studies ordered:  I ordered imaging studies including plain x-rays of the right foot  I independently visualized and interpreted imaging which showed soft tissue loss at distal phalanx great toe with loss of cortical margination at the tuft of the distal phalanx, suspicious for osteomyelitis I agree with the radiologist interpretation  Consultations Obtained:  I requested consultation with the vascular surgeon, Dr.Hawkins,  and discussed lab and imaging findings as well as pertinent plan - they recommend: Hospitalist admission to [facility] for further evaluation and management I requested consultation with the medicine team who agreed to admit   Social Determinants of Health:  Patient has  transportation issues   Test / Admission - Considered:  The patient has evidence of critical limb ischemia based on exam and ABI from earlier today. Admission is recommended for further evaluation and management    Final Clinical Impression(s) / ED Diagnoses Final diagnoses:  Critical limb ischemia of right lower extremity [facility])    Rx / DC Orders ED Discharge Orders     None         Ronny Bacon 10/19/21 2028    Fredia Sorrow, MD 11/03/21 1534

## 2021-10-19 NOTE — Assessment & Plan Note (Addendum)
-  Counseled on the importance of cessation -Counseled on the association between cigarettes and peripheral vascular disease -1 pack/day smoker, 21 mg nicotine patch for cravings

## 2021-10-19 NOTE — Assessment & Plan Note (Addendum)
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

## 2021-10-19 NOTE — Assessment & Plan Note (Addendum)
-   Last cocaine use was the day prior to admission -Counseled on the importance of cessation -TOC consult

## 2021-10-19 NOTE — Assessment & Plan Note (Addendum)
Outpatient ABIs show critical limb ischemia in the bilateral lower extremities CTA shows B/L PAD. Vascular surgery was consulted, appreciate assistance. - underwent right common femoral endarterectomy and R CFA to below-knee popliteal artery bypass with nonreversed, ipisilateral, translocated greater saphenous vein on 5/30 Plan for dressing removal tomorrow. PT recommends home with home health.

## 2021-10-19 NOTE — Assessment & Plan Note (Addendum)
-   Continue amlodipine -Use labetalol or hydralazine as needed as well

## 2021-10-19 NOTE — Assessment & Plan Note (Addendum)
-   Patient drinks 4-6 beers per day -Denies ever having withdrawals - no concern for withdrawal; d/c CIWA

## 2021-10-19 NOTE — Plan of Care (Signed)
Called by [facility] about patient with gangrenous right great toe and ankle pressure of 0 on today's ABI. The patient has difficulty with transportation, and could not get to [facility] ER today. Recommend transfer to [facility], admission to internal medicine service. Please keep NPO after midnight. I will plan to do his angiogram tomorrow afternoon to expedite his care ahead of the holiday weekend.   Rande Brunt. Lenell Antu, MD [facility] Phone Number: [phone] 10/19/2021 7:30 PM

## 2021-10-20 ENCOUNTER — Other Ambulatory Visit: Payer: Self-pay

## 2021-10-20 ENCOUNTER — Inpatient Hospital Stay (HOSPITAL_COMMUNITY): Payer: Medicare Other

## 2021-10-20 DIAGNOSIS — Z01810 Encounter for preprocedural cardiovascular examination: Secondary | ICD-10-CM | POA: Diagnosis not present

## 2021-10-20 DIAGNOSIS — I70223 Atherosclerosis of native arteries of extremities with rest pain, bilateral legs: Secondary | ICD-10-CM | POA: Diagnosis not present

## 2021-10-20 DIAGNOSIS — M869 Osteomyelitis, unspecified: Secondary | ICD-10-CM | POA: Diagnosis not present

## 2021-10-20 DIAGNOSIS — I70261 Atherosclerosis of native arteries of extremities with gangrene, right leg: Secondary | ICD-10-CM

## 2021-10-20 LAB — COMPREHENSIVE METABOLIC PANEL
ALT: 14 U/L (ref 0–44)
AST: 26 U/L (ref 15–41)
Albumin: 3.1 g/dL — ABNORMAL LOW (ref 3.5–5.0)
Alkaline Phosphatase: 81 U/L (ref 38–126)
Anion gap: 9 (ref 5–15)
BUN: 20 mg/dL (ref 8–23)
CO2: 21 mmol/L — ABNORMAL LOW (ref 22–32)
Calcium: 8.9 mg/dL (ref 8.9–10.3)
Chloride: 106 mmol/L (ref 98–111)
Creatinine, Ser: 1.09 mg/dL (ref 0.61–1.24)
GFR, Estimated: >60 mL/min (ref >60)
Glucose, Bld: 124 mg/dL — ABNORMAL HIGH (ref 70–99)
Potassium: 4.8 mmol/L (ref 3.5–5.1)
Sodium: 136 mmol/L (ref 135–145)
Total Bilirubin: 0.4 mg/dL (ref 0.3–1.2)
Total Protein: 6.6 g/dL (ref 6.5–8.1)

## 2021-10-20 LAB — CBC WITH DIFFERENTIAL/PLATELET
Abs Immature Granulocytes: 0.02 10*3/uL (ref 0.00–0.07)
Basophils Absolute: 0 10*3/uL (ref 0.0–0.1)
Basophils Relative: 0 %
Eosinophils Absolute: 0.3 10*3/uL (ref 0.0–0.5)
Eosinophils Relative: 4 %
HCT: 37.1 % — ABNORMAL LOW (ref 39.0–52.0)
Hemoglobin: 12 g/dL — ABNORMAL LOW (ref 13.0–17.0)
Immature Granulocytes: 0 %
Lymphocytes Relative: 21 %
Lymphs Abs: 1.4 10*3/uL (ref 0.7–4.0)
MCH: 28.4 pg (ref 26.0–34.0)
MCHC: 32.3 g/dL (ref 30.0–36.0)
MCV: 87.9 fL (ref 80.0–100.0)
Monocytes Absolute: 0.4 10*3/uL (ref 0.1–1.0)
Monocytes Relative: 6 %
Neutro Abs: 4.7 10*3/uL (ref 1.7–7.7)
Neutrophils Relative %: 69 %
Platelets: 452 10*3/uL — ABNORMAL HIGH (ref 150–400)
RBC: 4.22 MIL/uL (ref 4.22–5.81)
RDW: 14.6 % (ref 11.5–15.5)
WBC: 6.8 10*3/uL (ref 4.0–10.5)
nRBC: 0 % (ref 0.0–0.2)

## 2021-10-20 LAB — MAGNESIUM: Magnesium: 1.9 mg/dL (ref 1.7–2.4)

## 2021-10-20 MED ORDER — LABETALOL HCL 5 MG/ML IV SOLN: 10.0000 mg | INTRAVENOUS | Status: DC | PRN

## 2021-10-20 MED ORDER — HYDROMORPHONE HCL 1 MG/ML IJ SOLN
0.5000 mg | INTRAMUSCULAR | Status: DC | PRN
  Administered 2021-10-20 – 2021-10-26 (×20): 0.5 mg via INTRAVENOUS
  Filled 2021-10-20 (×20): qty 0.5

## 2021-10-20 MED ORDER — AMLODIPINE BESYLATE 5 MG PO TABS
5.0000 mg | ORAL_TABLET | Freq: Every day | ORAL | Status: DC
  Administered 2021-10-20 – 2021-10-21 (×2): 5 mg via ORAL
  Filled 2021-10-20 (×2): qty 1

## 2021-10-20 MED ORDER — ASPIRIN 81 MG PO TBEC
81.0000 mg | DELAYED_RELEASE_TABLET | Freq: Every day | ORAL | Status: DC
  Administered 2021-10-20 – 2021-10-26 (×7): 81 mg via ORAL
  Filled 2021-10-20 (×7): qty 1

## 2021-10-20 MED ORDER — IOHEXOL 350 MG/ML SOLN
100.0000 mL | Freq: Once | INTRAVENOUS | Status: AC | PRN
  Administered 2021-10-20: 100 mL via INTRAVENOUS

## 2021-10-20 MED ORDER — HYDRALAZINE HCL 25 MG PO TABS
25.0000 mg | ORAL_TABLET | ORAL | Status: DC | PRN
  Administered 2021-10-20 – 2021-10-21 (×2): 25 mg via ORAL
  Filled 2021-10-20 (×2): qty 1

## 2021-10-20 MED ORDER — ATORVASTATIN CALCIUM 80 MG PO TABS
80.0000 mg | ORAL_TABLET | Freq: Every day | ORAL | Status: DC
  Administered 2021-10-20 – 2021-10-25 (×6): 80 mg via ORAL
  Filled 2021-10-20 (×6): qty 1

## 2021-10-20 NOTE — Consult Note (Addendum)
[facility] [place]  ASSESSMENT / PLAN: Paul Madden is a 66 y.o. 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  patients with chronic limb threatening ischemia have an annual risk of cardiovascular mortality of 25% and a high risk of amputation.   WIfI score calculated based on clinical exam and non-invasive measurements. 2 / 3 / 2. High amputation risk. High benefit from revascularization  Recommend the following which can slow the progression of atherosclerosis and reduce the risk of major adverse cardiac / limb events:  Complete cessation from all tobacco products. Blood glucose control with goal A1c < 7%. Blood pressure control with goal blood pressure < 140/90 mmHg. Lipid reduction therapy with goal LDL-C <100 mg/dL (<73 if symptomatic from PAD).  Aspirin 81mg  PO QD.  Atorvastatin 40-80mg  PO QD (or other "high intensity" statin therapy).  Plan CT angiogram of abdomen and pelvis given absent L femoral pulse, very weak R femoral pulse. Expect aortoiliac occlusive disease + infrainguinal disease in RLE.   CHIEF COMPLAINT: right great toe gangrene  HISTORY OF PRESENT ILLNESS: Paul Madden is a 66 y.o. male admitted to the internal medicine service for right lower extremity chronic limb threatening ischemia.  The patient has had a several week history of progressive gangrene about the right great toe, per his report.  In electronic medical record, he is seen in the emergency department for right great toe complaints for several months.  The patient denies any medical problems but appears chronically ill.  He abuses tobacco, alcohol and cocaine.  VASCULAR SURGICAL HISTORY: none  VASCULAR RISK FACTORS: Negative history of stroke / transient ischemic attack. Negative history of coronary artery disease.  Negative history of diabetes mellitus.  Positive history of smoking. + actively smoking. Negative history of hypertension.   Negative history of chronic kidney disease.   Negative history of chronic obstructive pulmonary.  FUNCTIONAL STATUS: ECOG performance status: (0) Fully active, able to carry on all predisease performance without restriction Ambulatory status: Ambulatory within the community with limits  Past Medical History:  Diagnosis Date   Chronic low back pain    Hypertension     History reviewed. No pertinent surgical history.  History reviewed. No pertinent family history.  Social History   Socioeconomic History   Marital status: Single    Spouse name: Not on file   Number of children: Not on file   Years of education: Not on file   Highest education level: Not on file  Occupational History   Not on file  Tobacco Use   Smoking status: Every Day   Smokeless tobacco: Never  Substance and Sexual Activity   Alcohol use: Yes    Comment: occ   Drug use: No   Sexual activity: Not on file  Other Topics Concern   Not on file  Social History Narrative   Not on file   Social Determinants of Health   Financial Resource Strain: Not on file  Food Insecurity: Not on file  Transportation Needs: Not on file  Physical Activity: Not on file  Stress: Not on file  Social Connections: Not on file  Intimate Partner Violence: Not on file    No Known Allergies  Current Facility-Administered Medications  Medication Dose Route Frequency Provider Last Rate Last Admin   acetaminophen (TYLENOL) tablet 650 mg  650 mg Oral Q6H PRN Zierle-Ghosh, Asia B, DO   650 mg at 10/20/21 0136   Or   acetaminophen (TYLENOL) suppository  650 mg  650 mg Rectal Q6H PRN Zierle-Ghosh, Asia B, DO       folic acid (FOLVITE) tablet 1 mg  1 mg Oral Daily Zierle-Ghosh, Asia B, DO   1 mg at 10/20/21 0815   heparin injection 5,000 Units  5,000 Units Subcutaneous Q8H Zierle-Ghosh, Asia B, DO   5,000 Units at 10/20/21 2409   hydrALAZINE (APRESOLINE) injection 10 mg  10 mg Intravenous Q6H PRN Zierle-Ghosh, Asia B, DO        HYDROmorphone (DILAUDID) injection 0.5 mg  0.5 mg Intravenous Q2H PRN Eduard Clos, MD   0.5 mg at 10/20/21 0617   LORazepam (ATIVAN) tablet 1-4 mg  1-4 mg Oral Q1H PRN Zierle-Ghosh, Asia B, DO       Or   LORazepam (ATIVAN) injection 1-4 mg  1-4 mg Intravenous Q1H PRN Zierle-Ghosh, Asia B, DO       LORazepam (ATIVAN) tablet 0-4 mg  0-4 mg Oral Q6H Zierle-Ghosh, Asia B, DO       Followed by   Melene Muller ON 10/21/2021] LORazepam (ATIVAN) tablet 0-4 mg  0-4 mg Oral Q12H Zierle-Ghosh, Asia B, DO       multivitamin with minerals tablet 1 tablet  1 tablet Oral Daily Zierle-Ghosh, Asia B, DO   1 tablet at 10/20/21 0814   nicotine (NICODERM CQ - dosed in mg/24 hours) patch 21 mg  21 mg Transdermal Daily Zierle-Ghosh, Asia B, DO   21 mg at 10/20/21 0815   ondansetron (ZOFRAN) tablet 4 mg  4 mg Oral Q6H PRN Zierle-Ghosh, Asia B, DO       Or   ondansetron (ZOFRAN) injection 4 mg  4 mg Intravenous Q6H PRN Zierle-Ghosh, Asia B, DO       oxyCODONE (Oxy IR/ROXICODONE) immediate release tablet 5 mg  5 mg Oral Q4H PRN Zierle-Ghosh, Asia B, DO   5 mg at 10/19/21 2357   thiamine tablet 100 mg  100 mg Oral Daily Zierle-Ghosh, Asia B, DO       Or   thiamine (B-1) injection 100 mg  100 mg Intravenous Daily Zierle-Ghosh, Asia B, DO   100 mg at 10/20/21 0813    PHYSICAL EXAM Vitals:   10/19/21 1707 10/19/21 1954 10/19/21 2234 10/20/21 0328  BP: (!) 168/89 (!) 164/94 (!) 168/89 (!) 168/79  Pulse: 68 68 (!) 54 (!) 54  Resp: 18 18 14 16   Temp:   97.6 F (36.4 C) 97.9 F (36.6 C)  TempSrc:   Oral Oral  SpO2: 96% 99% 100% 95%  Weight:   54.7 kg   Height:   5\' 9"  (1.753 m)     Constitutional: chronically ill appearing. No distress. Thin. Cardiac: regular rate and rhythm.  Respiratory:  unlabored. Abdominal:  soft, non-tender, non-distended.  Peripheral vascular: absent left femoral pulse. Very weak right femoral pulse. Absent pedal pulses. Right great toe complex ulcer with gangrene.  PERTINENT  LABORATORY AND RADIOLOGIC DATA  Most recent CBC    Latest Ref Rng & Units 10/20/2021    1:32 AM 10/19/2021    4:08 PM 06/13/2021   12:40 PM  CBC  WBC 4.0 - 10.5 K/uL 6.8   7.6   8.8    Hemoglobin 13.0 - 17.0 g/dL 10/21/2021   06/15/2021   73.5    Hematocrit 39.0 - 52.0 % 37.1   40.0   39.6    Platelets 150 - 400 K/uL 452   428   496       Most recent  CMP    Latest Ref Rng & Units 10/20/2021    1:32 AM 10/19/2021   10:33 PM 10/19/2021    4:08 PM  CMP  Glucose 70 - 99 mg/dL 161124    86    BUN 8 - 23 mg/dL 20    22    Creatinine 0.61 - 1.24 mg/dL 0.961.09   0.451.23   4.091.09    Sodium 135 - 145 mmol/L 136    134    Potassium 3.5 - 5.1 mmol/L 4.8    4.8    Chloride 98 - 111 mmol/L 106    103    CO2 22 - 32 mmol/L 21    25    Calcium 8.9 - 10.3 mg/dL 8.9    9.2    Total Protein 6.5 - 8.1 g/dL 6.6    7.5    Total Bilirubin 0.3 - 1.2 mg/dL 0.4    0.3    Alkaline Phos 38 - 126 U/L 81    91    AST 15 - 41 U/L 26    26    ALT 0 - 44 U/L 14    18      Renal function Estimated Creatinine Clearance: 52.3 mL/min (by C-G formula based on SCr of 1.09 mg/dL).  No results found for: HGBA1C  LDL Chol Calc (NIH)  Date Value Ref Range Status  03/27/2021 63 0 - 99 mg/dL Final    Rande Brunthomas N. Lenell AntuHawken, MD [facility] Phone Number: [phone] 10/20/2021 8:18 AM  Total time spent on preparing this encounter including chart review, data review, collecting history, examining the patient, coordinating care for this new patient, 60 minutes.  Portions of this report may have been transcribed using voice recognition software.  Every effort has been made to ensure accuracy; however, inadvertent computerized transcription errors may still be present.

## 2021-10-20 NOTE — Progress Notes (Addendum)
Patient's heart rate was mostly 50s-60s. Around 0300 it started to stay in the 40s more.  The BP was 168/79. Pt not dizzy or symptomatic.  On call physician informed and ordered a STAT EKG.  The EKG was completed and showed no arrhythmia.  HR is now mostly in the 50s and occasionally dropping to 40s.  Will continue to monitor.  Harriet Masson, RN

## 2021-10-20 NOTE — Progress Notes (Signed)
  Transition of Care [facility]) Screening Note   Patient Details  Name: Paul Madden Date of Birth: 1955-06-25   Transition of Care [facility]) CM/SW Contact:    Darrold Span, RN Phone Number: 10/20/2021, 11:24 AM    Transition of Care Department [facility]) has reviewed patient and note Plan for CT angiogram of abdomen and pelvis . Acknowledge consult for substance abuse and will address prior to discharge. TOC will continue to monitor patient advancement through interdisciplinary progression rounds. If new patient transition needs arise, please place a TOC consult.

## 2021-10-20 NOTE — Progress Notes (Signed)
Progress Note    Paul Madden   TOI:[id number]  DOB: 01/12/1956  DOA: 10/19/2021     1 PCP: Anabel Halon, MD  Initial CC: Right foot pain  Hospital Course: Paul Madden is a 66 y.o. male with PMH HTN, tobacco use, etoh use, substance abuse who presented with right foot pain.  He also endorsed developing right calf pain at times after walking and noted that resting did help his pain. He was admitted for further vascular work-up.  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  Vascular surgery was consulted on admission.  Interval History:  Resting in bed when seen this afternoon still having pain in his right foot and leg.  Reviewed findings on imaging scans with him.  He understands tentative plan for revascularization pending further discussions with vascular surgery.  Definitive treatment to the foot yet to be determined.  Assessment and Plan: * Critical limb ischemia of both lower extremities (HCC) - Outpatient ABIs show critical limb ischemia in the bilateral lower extremities - CTA shows B/L PAD. See full report -Vascular surgery following, appreciate assistance - Tentative plan is for femoropopliteal bypass - continue HSQ, asa, and lipitor  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 - nontoxic appearing; no s/s sepsis; hold off on abx - follow up vascular surgery evaluation; tentative plan for re-vascularization then likely needs amputation following  Alcohol use - Patient drinks 4-6 beers per day -Denies ever having withdrawals -CIWA protocol  Substance abuse (HCC) - Last cocaine use was the day prior to admission -Counseled on the importance of cessation -TOC consult  Tobacco abuse - Counseled on the importance of cessation -Counseled on the association between cigarettes and peripheral  vascular disease -1 pack/day smoker, 21 mg nicotine patch for cravings   Essential hypertension - Continue amlodipine -Use labetalol or hydralazine as needed as well    Old records reviewed in assessment of this patient  Antimicrobials:   DVT prophylaxis:  heparin injection 5,000 Units Start: 10/19/21 2300 SCDs Start: 10/19/21 2210 SCDs Start: 10/19/21 2210   Code Status:   Code Status: Full Code  Disposition Plan: Home Status is: Inpatient  Objective: Blood pressure 131/82, pulse 62, temperature (!) 97.4 F (36.3 C), temperature source Oral, resp. rate 18, height 5\' 9"  (1.753 m), weight 54.7 kg, SpO2 100 %.  Examination:  Physical Exam Constitutional:      General: He is not in acute distress.    Appearance: Normal appearance.  HENT:     Head: Normocephalic and atraumatic.     Mouth/Throat:     Mouth: Mucous membranes are moist.  Eyes:     Extraocular Movements: Extraocular movements intact.  Cardiovascular:     Rate and Rhythm: Normal rate and regular rhythm.     Heart sounds: Normal heart sounds.  Pulmonary:     Effort: Pulmonary effort is normal. No respiratory distress.     Breath sounds: Normal breath sounds. No wheezing.  Abdominal:     General: Bowel sounds are normal. There is no distension.     Palpations: Abdomen is soft.     Tenderness: There is no abdominal tenderness.  Musculoskeletal:     Cervical back: Normal range of motion and neck supple.     Comments: Dry gangrene noted in right great toe  Skin:    General: Skin is  warm and dry.  Neurological:     Mental Status: He is alert and oriented to person, place, and time.  Psychiatric:        Mood and Affect: Mood normal.        Behavior: Behavior normal.     Consultants:  Vascular surgery  Procedures:    Data Reviewed: Results for orders placed or performed during the hospital encounter of 10/19/21 (from the past 24 hour(s))  Creatinine, serum     Status: None   Collection Time:  10/19/21 10:33 PM  Result Value Ref Range   Creatinine, Ser 1.23 0.61 - 1.24 mg/dL   GFR, Estimated >14 >43 mL/min  Comprehensive metabolic panel     Status: Abnormal   Collection Time: 10/20/21  1:32 AM  Result Value Ref Range   Sodium 136 135 - 145 mmol/L   Potassium 4.8 3.5 - 5.1 mmol/L   Chloride 106 98 - 111 mmol/L   CO2 21 (L) 22 - 32 mmol/L   Glucose, Bld 124 (H) 70 - 99 mg/dL   BUN 20 8 - 23 mg/dL   Creatinine, Ser 1.54 0.61 - 1.24 mg/dL   Calcium 8.9 8.9 - 00.8 mg/dL   Total Protein 6.6 6.5 - 8.1 g/dL   Albumin 3.1 (L) 3.5 - 5.0 g/dL   AST 26 15 - 41 U/L   ALT 14 0 - 44 U/L   Alkaline Phosphatase 81 38 - 126 U/L   Total Bilirubin 0.4 0.3 - 1.2 mg/dL   GFR, Estimated >67 >61 mL/min   Anion gap 9 5 - 15  Magnesium     Status: None   Collection Time: 10/20/21  1:32 AM  Result Value Ref Range   Magnesium 1.9 1.7 - 2.4 mg/dL  CBC with Differential/Platelet     Status: Abnormal   Collection Time: 10/20/21  1:32 AM  Result Value Ref Range   WBC 6.8 4.0 - 10.5 K/uL   RBC 4.22 4.22 - 5.81 MIL/uL   Hemoglobin 12.0 (L) 13.0 - 17.0 g/dL   HCT 95.0 (L) 93.2 - 67.1 %   MCV 87.9 80.0 - 100.0 fL   MCH 28.4 26.0 - 34.0 pg   MCHC 32.3 30.0 - 36.0 g/dL   RDW 24.5 80.9 - 98.3 %   Platelets 452 (H) 150 - 400 K/uL   nRBC 0.0 0.0 - 0.2 %   Neutrophils Relative % 69 %   Neutro Abs 4.7 1.7 - 7.7 K/uL   Lymphocytes Relative 21 %   Lymphs Abs 1.4 0.7 - 4.0 K/uL   Monocytes Relative 6 %   Monocytes Absolute 0.4 0.1 - 1.0 K/uL   Eosinophils Relative 4 %   Eosinophils Absolute 0.3 0.0 - 0.5 K/uL   Basophils Relative 0 %   Basophils Absolute 0.0 0.0 - 0.1 K/uL   Immature Granulocytes 0 %   Abs Immature Granulocytes 0.02 0.00 - 0.07 K/uL    I have Reviewed nursing notes, Vitals, and Lab results since pt's last encounter. Pertinent lab results : see above I have ordered test including BMP, CBC, Mg I have reviewed the last note from staff over past 24 hours I have discussed pt's care  plan and test results with nursing staff, case manager   LOS: 1 day   Lewie Chamber, MD [facility] 10/20/2021, 4:36 PM

## 2021-10-20 NOTE — Progress Notes (Signed)
Bilateral lower extremity vein mapping completed. Refer to "CV Proc" under chart review to view preliminary results.  10/20/2021 2:22 PM Kelby Aline., MHA, RVT, RDCS, RDMS

## 2021-10-20 NOTE — Hospital Course (Signed)
Paul Madden is a 66 y.o. male with PMH HTN, tobacco use, etoh use, substance abuse who presented with right foot pain.  He also endorsed developing right calf pain at times after walking and noted that resting did help his pain. He was admitted for further vascular work-up.  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  Vascular surgery was consulted on admission.

## 2021-10-21 DIAGNOSIS — M869 Osteomyelitis, unspecified: Secondary | ICD-10-CM | POA: Diagnosis not present

## 2021-10-21 DIAGNOSIS — I70223 Atherosclerosis of native arteries of extremities with rest pain, bilateral legs: Secondary | ICD-10-CM | POA: Diagnosis not present

## 2021-10-21 LAB — LIPID PANEL
Cholesterol: 120 mg/dL (ref 0–200)
HDL: 49 mg/dL (ref >40)
LDL Cholesterol: 63 mg/dL (ref 0–99)
Total CHOL/HDL Ratio: 2.4 RATIO
Triglycerides: 40 mg/dL (ref <150)
VLDL: 8 mg/dL (ref 0–40)

## 2021-10-21 LAB — BASIC METABOLIC PANEL
Anion gap: 6 (ref 5–15)
BUN: 24 mg/dL — ABNORMAL HIGH (ref 8–23)
CO2: 26 mmol/L (ref 22–32)
Calcium: 9.1 mg/dL (ref 8.9–10.3)
Chloride: 101 mmol/L (ref 98–111)
Creatinine, Ser: 1.07 mg/dL (ref 0.61–1.24)
GFR, Estimated: >60 mL/min (ref >60)
Glucose, Bld: 93 mg/dL (ref 70–99)
Potassium: 4.5 mmol/L (ref 3.5–5.1)
Sodium: 133 mmol/L — ABNORMAL LOW (ref 135–145)

## 2021-10-21 LAB — CBC WITH DIFFERENTIAL/PLATELET
Abs Immature Granulocytes: 0.07 10*3/uL (ref 0.00–0.07)
Basophils Absolute: 0 10*3/uL (ref 0.0–0.1)
Basophils Relative: 0 %
Eosinophils Absolute: 0.2 10*3/uL (ref 0.0–0.5)
Eosinophils Relative: 3 %
HCT: 38.8 % — ABNORMAL LOW (ref 39.0–52.0)
Hemoglobin: 12.3 g/dL — ABNORMAL LOW (ref 13.0–17.0)
Immature Granulocytes: 1 %
Lymphocytes Relative: 21 %
Lymphs Abs: 1.5 10*3/uL (ref 0.7–4.0)
MCH: 27.8 pg (ref 26.0–34.0)
MCHC: 31.7 g/dL (ref 30.0–36.0)
MCV: 87.8 fL (ref 80.0–100.0)
Monocytes Absolute: 0.4 10*3/uL (ref 0.1–1.0)
Monocytes Relative: 6 %
Neutro Abs: 4.9 10*3/uL (ref 1.7–7.7)
Neutrophils Relative %: 69 %
Platelets: 457 10*3/uL — ABNORMAL HIGH (ref 150–400)
RBC: 4.42 MIL/uL (ref 4.22–5.81)
RDW: 14.6 % (ref 11.5–15.5)
WBC: 7.2 10*3/uL (ref 4.0–10.5)
nRBC: 0 % (ref 0.0–0.2)

## 2021-10-21 LAB — MAGNESIUM: Magnesium: 2 mg/dL (ref 1.7–2.4)

## 2021-10-21 LAB — HEMOGLOBIN A1C
Hgb A1c MFr Bld: 5.2 % (ref 4.8–5.6)
Mean Plasma Glucose: 102.54 mg/dL

## 2021-10-21 MED ORDER — OXYCODONE HCL 5 MG PO TABS
5.0000 mg | ORAL_TABLET | ORAL | Status: DC | PRN
  Administered 2021-10-21 – 2021-10-25 (×20): 10 mg via ORAL
  Filled 2021-10-21 (×21): qty 2

## 2021-10-21 MED ORDER — SENNOSIDES-DOCUSATE SODIUM 8.6-50 MG PO TABS
1.0000 | ORAL_TABLET | Freq: Two times a day (BID) | ORAL | Status: DC
  Administered 2021-10-21 – 2021-10-26 (×9): 1 via ORAL
  Filled 2021-10-21 (×10): qty 1

## 2021-10-21 MED ORDER — POLYETHYLENE GLYCOL 3350 17 G PO PACK
17.0000 g | PACK | Freq: Every day | ORAL | Status: DC
  Administered 2021-10-21 – 2021-10-26 (×3): 17 g via ORAL
  Filled 2021-10-21 (×3): qty 1

## 2021-10-21 NOTE — Progress Notes (Addendum)
Progress Note    10/21/2021 7:08 AM Hospital Day 1  Subjective:  no complaints  afebrile  Vitals:   10/20/21 2319 10/21/21 0516  BP: (!) 156/82 (!) 160/92  Pulse: (!) 59 70  Resp: 14 20  Temp: 98.1 F (36.7 C) 98.1 F (36.7 C)  SpO2: 93% 96%    Physical Exam: General:  resting comfortably Lungs:  non labored Extremities:  right great toe continues with dry gangrene  CBC    Component Value Date/Time   WBC 7.2 10/21/2021 0121   RBC 4.42 10/21/2021 0121   HGB 12.3 (L) 10/21/2021 0121   HGB 11.9 (L) 03/27/2021 1527   HCT 38.8 (L) 10/21/2021 0121   HCT 35.9 (L) 03/27/2021 1527   PLT 457 (H) 10/21/2021 0121   PLT 463 (H) 03/27/2021 1527   MCV 87.8 10/21/2021 0121   MCV 88 03/27/2021 1527   MCH 27.8 10/21/2021 0121   MCHC 31.7 10/21/2021 0121   RDW 14.6 10/21/2021 0121   RDW 13.2 03/27/2021 1527   LYMPHSABS 1.5 10/21/2021 0121   LYMPHSABS 1.2 03/27/2021 1527   MONOABS 0.4 10/21/2021 0121   EOSABS 0.2 10/21/2021 0121   EOSABS 0.1 03/27/2021 1527   BASOSABS 0.0 10/21/2021 0121   BASOSABS 0.0 03/27/2021 1527    BMET    Component Value Date/Time   NA 133 (L) 10/21/2021 0121   NA 144 03/27/2021 1527   K 4.5 10/21/2021 0121   CL 101 10/21/2021 0121   CO2 26 10/21/2021 0121   GLUCOSE 93 10/21/2021 0121   BUN 24 (H) 10/21/2021 0121   BUN 11 03/27/2021 1527   CREATININE 1.07 10/21/2021 0121   CALCIUM 9.1 10/21/2021 0121   GFRNONAA >60 10/21/2021 0121    INR No results found for: INR   Intake/Output Summary (Last 24 hours) at 10/21/2021 0708 Last data filed at 10/20/2021 2147 Gross per 24 hour  Intake 200 ml  Output --  Net 200 ml    CTA abdomen/pelvis with runoff 10/20/2021 IMPRESSION: VASCULAR   1. Severe bilateral peripheral arterial disease  2. On the right, there is chronic total occlusion of the internal iliac artery and superficial femoral artery from just beyond the origin all the way through the P1 and P2 segments of the popliteal artery  with reconstitution at the P3 segment. 3. On the left, there is chronic total occlusion of the external iliac artery and the proximal and mid aspects of the common femoral artery collateralized by a hypertrophic internal iliac artery and obturator artery to the profunda femoral artery. 4. Code dominant right-sided renal arteries. There is a high-grade stenosis at the origin the more inferior right renal artery. 5. 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 6. 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 7.  Aortic Atherosclerosis (ICD10-I70.0).   NON-VASCULAR   1. No acute abnormality within the abdomen or pelvis. 2. Left pelvic kidney. 3. Moderately large right inguinal hernia containing multiple loops of small bowel without evidence of obstruction or inflammation. 4. Additional ancillary findings as above.   Assessment/Plan:  66 y.o. male with PAD with right great toe gangrene Hospital Day 1  -pt has a right chronic total occlusion of the IIA and SFA and on the left, chronic total occlusion of the EIA and proximal and mid aspects of the CFA -plan for OR on Tuesday with Dr. Johny Shears, PA-C [facility] [phone] 10/21/2021 7:08 AM  I have independently interviewed and examined  patient and agree with PA assessment and plan above.  Plan is for at least right femoral to popliteal artery bypass with right common femoral endarterectomy on Tuesday in the OR.  I will also discuss possible left common femoral endarterectomy and attempted retrograde iliac artery stenting given toe pressure of 9.  He would benefit from right great toe amputation at the time of surgery as well.  Madlynn Lundeen C. Randie Heinz, MD [facility] [place] Office: [phone] Pager: [phone]

## 2021-10-21 NOTE — Progress Notes (Signed)
Progress Note    Brodrick Fieldhouse   [id number]  DOB: [birth year]  DOA: 10/19/2021     2 PCP: Lindell Spar, MD  Initial CC: Right foot pain  Hospital Course: Paul Madden is a 66 y.o. male with PMH HTN, tobacco use, etoh use, substance abuse who presented with right foot pain.  He also endorsed developing right calf pain at times after walking and noted that resting did help his pain. He was admitted for further vascular work-up.  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  Vascular surgery was consulted on admission.  Interval History:  No events overnight.  Resting in bed comfortably when seen this morning.  He was complaining of some pain and wanting some pain medication which was being brought.  Otherwise understands plan is for surgery on Tuesday.  Assessment and Plan: * Critical limb ischemia of both lower extremities (HCC) - Outpatient ABIs show critical limb ischemia in the bilateral lower extremities - CTA shows B/L PAD. See full report -Vascular surgery following, appreciate assistance - Tentative plan is for surgery on 5/30 with Dr. Donzetta Matters  - continue HSQ, asa, and lipitor  Osteomyelitis ([place]) - Dry gangrene with osteomyelitis noted on x-ray - nontoxic appearing; no s/s sepsis; hold off on abx -Appreciate vascular surgery involvement.  Plan is for surgery on Tuesday with concomitant right great toe amputation  Alcohol use - Patient drinks 4-6 beers per day -Denies ever having withdrawals - no concern for withdrawal; d/c CIWA  Substance abuse ([place]) - Last cocaine use was the day prior to admission -Counseled on the importance of cessation -TOC consult  Tobacco abuse - Counseled on the importance of cessation -Counseled on the association between cigarettes and peripheral vascular disease -1 pack/day smoker, 21 mg  nicotine patch for cravings   Essential hypertension - Continue amlodipine -Use labetalol or hydralazine as needed as well    Old records reviewed in assessment of this patient  Antimicrobials:   DVT prophylaxis:  heparin injection 5,000 Units Start: 10/19/21 2300 SCDs Start: 10/19/21 2210 SCDs Start: 10/19/21 2210   Code Status:   Code Status: Full Code  Disposition Plan: Home Status is: Inpatient  Objective: Blood pressure 134/76, pulse 77, temperature 97.8 F (36.6 C), temperature source Oral, resp. rate 17, height 5\' 9"  (1.753 m), weight 54.7 kg, SpO2 99 %.  Examination:  Physical Exam Constitutional:      General: He is not in acute distress.    Appearance: Normal appearance.  HENT:     Head: Normocephalic and atraumatic.     Mouth/Throat:     Mouth: Mucous membranes are moist.  Eyes:     Extraocular Movements: Extraocular movements intact.  Cardiovascular:     Rate and Rhythm: Normal rate and regular rhythm.     Heart sounds: Normal heart sounds.  Pulmonary:     Effort: Pulmonary effort is normal. No respiratory distress.     Breath sounds: Normal breath sounds. No wheezing.  Abdominal:     General: Bowel sounds are normal. There is no distension.     Palpations: Abdomen is soft.     Tenderness: There is no abdominal tenderness.  Musculoskeletal:     Cervical back: Normal range of motion and neck supple.     Comments: Dry gangrene noted in right great toe  Skin:    General: Skin  is warm and dry.  Neurological:     Mental Status: He is alert and oriented to person, place, and time.  Psychiatric:        Mood and Affect: Mood normal.        Behavior: Behavior normal.     Consultants:  Vascular surgery  Procedures:    Data Reviewed: Results for orders placed or performed during the hospital encounter of 10/19/21 (from the past 24 hour(s))  Lipid panel     Status: None   Collection Time: 10/21/21  1:21 AM  Result Value Ref Range   Cholesterol  120 0 - 200 mg/dL   Triglycerides 40 <150 mg/dL   HDL 49 >40 mg/dL   Total CHOL/HDL Ratio 2.4 RATIO   VLDL 8 0 - 40 mg/dL   LDL Cholesterol 63 0 - 99 mg/dL  Hemoglobin A1c     Status: None   Collection Time: 10/21/21  1:21 AM  Result Value Ref Range   Hgb A1c MFr Bld 5.2 4.8 - 5.6 %   Mean Plasma Glucose 102.54 mg/dL  Basic metabolic panel     Status: Abnormal   Collection Time: 10/21/21  1:21 AM  Result Value Ref Range   Sodium 133 (L) 135 - 145 mmol/L   Potassium 4.5 3.5 - 5.1 mmol/L   Chloride 101 98 - 111 mmol/L   CO2 26 22 - 32 mmol/L   Glucose, Bld 93 70 - 99 mg/dL   BUN 24 (H) 8 - 23 mg/dL   Creatinine, Ser 1.07 0.61 - 1.24 mg/dL   Calcium 9.1 8.9 - 10.3 mg/dL   GFR, Estimated >60 >60 mL/min   Anion gap 6 5 - 15  CBC with Differential/Platelet     Status: Abnormal   Collection Time: 10/21/21  1:21 AM  Result Value Ref Range   WBC 7.2 4.0 - 10.5 K/uL   RBC 4.42 4.22 - 5.81 MIL/uL   Hemoglobin 12.3 (L) 13.0 - 17.0 g/dL   HCT 38.8 (L) 39.0 - 52.0 %   MCV 87.8 80.0 - 100.0 fL   MCH 27.8 26.0 - 34.0 pg   MCHC 31.7 30.0 - 36.0 g/dL   RDW 14.6 11.5 - 15.5 %   Platelets 457 (H) 150 - 400 K/uL   nRBC 0.0 0.0 - 0.2 %   Neutrophils Relative % 69 %   Neutro Abs 4.9 1.7 - 7.7 K/uL   Lymphocytes Relative 21 %   Lymphs Abs 1.5 0.7 - 4.0 K/uL   Monocytes Relative 6 %   Monocytes Absolute 0.4 0.1 - 1.0 K/uL   Eosinophils Relative 3 %   Eosinophils Absolute 0.2 0.0 - 0.5 K/uL   Basophils Relative 0 %   Basophils Absolute 0.0 0.0 - 0.1 K/uL   Immature Granulocytes 1 %   Abs Immature Granulocytes 0.07 0.00 - 0.07 K/uL  Magnesium     Status: None   Collection Time: 10/21/21  1:21 AM  Result Value Ref Range   Magnesium 2.0 1.7 - 2.4 mg/dL    I have Reviewed nursing notes, Vitals, and Lab results since pt's last encounter. Pertinent lab results : see above I have ordered test including BMP, CBC, Mg I have reviewed the last note from staff over past 24 hours I have discussed  pt's care plan and test results with nursing staff, case manager   LOS: 2 days   Dwyane Dee, MD [facility] 10/21/2021, 2:41 PM

## 2021-10-22 DIAGNOSIS — I70223 Atherosclerosis of native arteries of extremities with rest pain, bilateral legs: Secondary | ICD-10-CM | POA: Diagnosis not present

## 2021-10-22 MED ORDER — AMLODIPINE BESYLATE 10 MG PO TABS
10.0000 mg | ORAL_TABLET | Freq: Every day | ORAL | Status: DC
  Administered 2021-10-22 – 2021-10-26 (×5): 10 mg via ORAL
  Filled 2021-10-22 (×5): qty 1

## 2021-10-22 NOTE — Progress Notes (Signed)
  Progress Note    10/22/2021 12:13 PM * No surgery found *  Subjective: No overnight issues  Vitals:   10/22/21 0840 10/22/21 1152  BP: (!) 149/81 (!) 152/87  Pulse: (!) 57 66  Resp: 18 18  Temp: 98.2 F (36.8 C) 98.1 F (36.7 C)  SpO2: 93% 96%    Physical Exam: Awake alert oriented Right common femoral pulses palpable although weakly Right great toe wound with dressing in place  CBC    Component Value Date/Time   WBC 7.2 10/21/2021 0121   RBC 4.42 10/21/2021 0121   HGB 12.3 (L) 10/21/2021 0121   HGB 11.9 (L) 03/27/2021 1527   HCT 38.8 (L) 10/21/2021 0121   HCT 35.9 (L) 03/27/2021 1527   PLT 457 (H) 10/21/2021 0121   PLT 463 (H) 03/27/2021 1527   MCV 87.8 10/21/2021 0121   MCV 88 03/27/2021 1527   MCH 27.8 10/21/2021 0121   MCHC 31.7 10/21/2021 0121   RDW 14.6 10/21/2021 0121   RDW 13.2 03/27/2021 1527   LYMPHSABS 1.5 10/21/2021 0121   LYMPHSABS 1.2 03/27/2021 1527   MONOABS 0.4 10/21/2021 0121   EOSABS 0.2 10/21/2021 0121   EOSABS 0.1 03/27/2021 1527   BASOSABS 0.0 10/21/2021 0121   BASOSABS 0.0 03/27/2021 1527    BMET    Component Value Date/Time   NA 133 (L) 10/21/2021 0121   NA 144 03/27/2021 1527   K 4.5 10/21/2021 0121   CL 101 10/21/2021 0121   CO2 26 10/21/2021 0121   GLUCOSE 93 10/21/2021 0121   BUN 24 (H) 10/21/2021 0121   BUN 11 03/27/2021 1527   CREATININE 1.07 10/21/2021 0121   CALCIUM 9.1 10/21/2021 0121   GFRNONAA >60 10/21/2021 0121    INR No results found for: INR   Intake/Output Summary (Last 24 hours) at 10/22/2021 1213 Last data filed at 10/22/2021 0840 Gross per 24 hour  Intake 960 ml  Output --  Net 960 ml     Assessment:  66 y.o. male is here with gangrenous changes of right great toe  Plan: He will go to the operating room on Tuesday for right femoral to below-knee popliteal artery bypass with likely vein.  He is not having any symptoms from the left so this can be addressed in the future.  Torrin Frein C. Randie Heinz,  MD [facility] [place] Office: [phone] Pager: [phone]  10/22/2021 12:13 PM

## 2021-10-22 NOTE — Progress Notes (Signed)
Progress Note    Paul Madden   [id number]  DOB: 10-07-1955  DOA: 10/19/2021     3 PCP: Lindell Spar, MD  Initial CC: Right foot pain  Hospital Course: Paul Madden is a 66 y.o. male with PMH HTN, tobacco use, etoh use, substance abuse who presented with right foot pain.  He also endorsed developing right calf pain at times after walking and noted that resting did help his pain. He was admitted for further vascular work-up.  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  Vascular surgery was consulted on admission.  Interval History:  No events overnight. Pain appears a little better today. Understands plan for surgery on Tuesday.   Assessment and Plan: * Critical limb ischemia of both lower extremities (HCC) - Outpatient ABIs show critical limb ischemia in the bilateral lower extremities - CTA shows B/L PAD. See full report -Vascular surgery following, appreciate assistance - Tentative plan is for surgery on RLE on 5/30 with Dr. Donzetta Matters  - continue HSQ, asa, and lipitor  Osteomyelitis of toe of right foot ([place]) - Dry gangrene with osteomyelitis noted on x-ray - nontoxic appearing; no s/s sepsis; hold off on abx -Appreciate vascular surgery involvement.  Plan is for surgery on Tuesday with concomitant right great toe amputation  Alcohol use - Patient drinks 4-6 beers per day -Denies ever having withdrawals - no concern for withdrawal; d/c CIWA  Substance abuse ([place]) - Last cocaine use was the day prior to admission -Counseled on the importance of cessation -TOC consult  Tobacco abuse - Counseled on the importance of cessation -Counseled on the association between cigarettes and peripheral vascular disease -1 pack/day smoker, 21 mg nicotine patch for cravings   Essential hypertension - Continue amlodipine -Use  labetalol or hydralazine as needed as well    Old records reviewed in assessment of this patient  Antimicrobials:   DVT prophylaxis:  heparin injection 5,000 Units Start: 10/19/21 2300 SCDs Start: 10/19/21 2210 SCDs Start: 10/19/21 2210   Code Status:   Code Status: Full Code  Disposition Plan: Home Status is: Inpatient  Objective: Blood pressure (!) 152/87, pulse 66, temperature 98.1 F (36.7 C), temperature source Oral, resp. rate 18, height 5\' 9"  (1.753 m), weight 54.7 kg, SpO2 96 %.  Examination:  Physical Exam Constitutional:      General: He is not in acute distress.    Appearance: Normal appearance.  HENT:     Head: Normocephalic and atraumatic.     Mouth/Throat:     Mouth: Mucous membranes are moist.  Eyes:     Extraocular Movements: Extraocular movements intact.  Cardiovascular:     Rate and Rhythm: Normal rate and regular rhythm.     Heart sounds: Normal heart sounds.  Pulmonary:     Effort: Pulmonary effort is normal. No respiratory distress.     Breath sounds: Normal breath sounds. No wheezing.  Abdominal:     General: Bowel sounds are normal. There is no distension.     Palpations: Abdomen is soft.     Tenderness: There is no abdominal tenderness.  Musculoskeletal:     Cervical back: Normal range of motion and neck supple.     Comments: Dry gangrene noted in right great toe  Skin:    General: Skin is warm and dry.  Neurological:     Mental Status: He  is alert and oriented to person, place, and time.  Psychiatric:        Mood and Affect: Mood normal.        Behavior: Behavior normal.     Consultants:  Vascular surgery  Procedures:    Data Reviewed: No results found for this or any previous visit (from the past 24 hour(s)).   I have Reviewed nursing notes, Vitals, and Lab results since pt's last encounter. Pertinent lab results : see above I have reviewed the last note from staff over past 24 hours I have discussed pt's care plan and test  results with nursing staff, case manager   LOS: 3 days   Dwyane Dee, MD [facility] 10/22/2021, 12:21 PM

## 2021-10-23 DIAGNOSIS — M869 Osteomyelitis, unspecified: Secondary | ICD-10-CM | POA: Diagnosis not present

## 2021-10-23 DIAGNOSIS — I70223 Atherosclerosis of native arteries of extremities with rest pain, bilateral legs: Secondary | ICD-10-CM | POA: Diagnosis not present

## 2021-10-23 LAB — CBC
HCT: 38.3 % — ABNORMAL LOW (ref 39.0–52.0)
Hemoglobin: 12.3 g/dL — ABNORMAL LOW (ref 13.0–17.0)
MCH: 28.2 pg (ref 26.0–34.0)
MCHC: 32.1 g/dL (ref 30.0–36.0)
MCV: 87.8 fL (ref 80.0–100.0)
Platelets: 421 10*3/uL — ABNORMAL HIGH (ref 150–400)
RBC: 4.36 MIL/uL (ref 4.22–5.81)
RDW: 14.3 % (ref 11.5–15.5)
WBC: 6.2 10*3/uL (ref 4.0–10.5)
nRBC: 0 % (ref 0.0–0.2)

## 2021-10-23 LAB — APTT: aPTT: 36 seconds (ref 24–36)

## 2021-10-23 LAB — COMPREHENSIVE METABOLIC PANEL
ALT: 17 U/L (ref 0–44)
AST: 28 U/L (ref 15–41)
Albumin: 3.2 g/dL — ABNORMAL LOW (ref 3.5–5.0)
Alkaline Phosphatase: 74 U/L (ref 38–126)
Anion gap: 5 (ref 5–15)
BUN: 26 mg/dL — ABNORMAL HIGH (ref 8–23)
CO2: 29 mmol/L (ref 22–32)
Calcium: 9 mg/dL (ref 8.9–10.3)
Chloride: 105 mmol/L (ref 98–111)
Creatinine, Ser: 1.24 mg/dL (ref 0.61–1.24)
GFR, Estimated: >60 mL/min (ref >60)
Glucose, Bld: 118 mg/dL — ABNORMAL HIGH (ref 70–99)
Potassium: 4.7 mmol/L (ref 3.5–5.1)
Sodium: 139 mmol/L (ref 135–145)
Total Bilirubin: 0.2 mg/dL — ABNORMAL LOW (ref 0.3–1.2)
Total Protein: 6.9 g/dL (ref 6.5–8.1)

## 2021-10-23 LAB — PROTIME-INR
INR: 1.1 (ref 0.8–1.2)
Prothrombin Time: 14.5 seconds (ref 11.4–15.2)

## 2021-10-23 LAB — TYPE AND SCREEN
ABO/RH(D): A POS
Antibody Screen: NEGATIVE

## 2021-10-23 LAB — ABO/RH: ABO/RH(D): A POS

## 2021-10-23 MED ORDER — CHLORHEXIDINE GLUCONATE CLOTH 2 % EX PADS
6.0000 | MEDICATED_PAD | Freq: Once | CUTANEOUS | Status: AC
  Administered 2021-10-23: 6 via TOPICAL

## 2021-10-23 MED ORDER — SODIUM CHLORIDE 0.9 % IV SOLN: INTRAVENOUS | Status: DC

## 2021-10-23 MED ORDER — CHLORHEXIDINE GLUCONATE CLOTH 2 % EX PADS
6.0000 | MEDICATED_PAD | Freq: Once | CUTANEOUS | Status: AC
  Administered 2021-10-24: 6 via TOPICAL

## 2021-10-23 MED ORDER — CEFAZOLIN SODIUM-DEXTROSE 2-4 GM/100ML-% IV SOLN
2.0000 g | INTRAVENOUS | Status: AC
  Administered 2021-10-24: 2 g via INTRAVENOUS
  Filled 2021-10-23: qty 100

## 2021-10-23 MED ORDER — CEFAZOLIN SODIUM-DEXTROSE 1-4 GM/50ML-% IV SOLN: 1.0000 g | INTRAVENOUS | Status: DC

## 2021-10-23 NOTE — Progress Notes (Signed)
Progress Note    Paul Madden   JJK:[id number]  DOB: 11-30-1955  DOA: 10/19/2021     4 PCP: Anabel Halon, MD  Initial CC: Right foot pain  Hospital Course: Paul Madden is a 66 y.o. male with PMH HTN, tobacco use, etoh use, substance abuse who presented with right foot pain.  He also endorsed developing right calf pain at times after walking and noted that resting did help his pain. He was admitted for further vascular work-up.  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  Vascular surgery was consulted on admission.  Interval History:  No events overnight. Understands plan for surgery on Tuesday.  Resting quietly in bed.   Assessment and Plan: * Critical limb ischemia of both lower extremities (HCC) - Outpatient ABIs show critical limb ischemia in the bilateral lower extremities - CTA shows B/L PAD. See full report -Vascular surgery following, appreciate assistance - Tentative plan is for surgery on RLE on 5/30 with Dr. 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  Plan is for surgery on Tuesday with concomitant right great toe amputation  Alcohol use - Patient drinks 4-6 beers per day -Denies ever having withdrawals - no concern for withdrawal; d/c CIWA  Substance abuse (HCC) - Last cocaine use was the day prior to admission -Counseled on the importance of cessation -TOC consult  Tobacco abuse - Counseled on the importance of cessation -Counseled on the association between cigarettes and peripheral vascular disease -1 pack/day smoker, 21 mg nicotine patch for cravings   Essential hypertension - Continue amlodipine -Use labetalol or  hydralazine as needed as well    Old records reviewed in assessment of this patient  Antimicrobials:   DVT prophylaxis:  heparin injection 5,000 Units Start: 10/19/21 2300 SCDs Start: 10/19/21 2210 SCDs Start: 10/19/21 2210   Code Status:   Code Status: Full Code  Disposition Plan: Home Status is: Inpatient  Objective: Blood pressure 121/87, pulse 79, temperature 98.4 F (36.9 C), temperature source Oral, resp. rate 18, height 5\' 9"  (1.753 m), weight 54.7 kg, SpO2 97 %.  Examination:  Physical Exam Constitutional:      General: He is not in acute distress.    Appearance: Normal appearance.  HENT:     Head: Normocephalic and atraumatic.     Mouth/Throat:     Mouth: Mucous membranes are moist.  Eyes:     Extraocular Movements: Extraocular movements intact.  Cardiovascular:     Rate and Rhythm: Normal rate and regular rhythm.     Heart sounds: Normal heart sounds.  Pulmonary:     Effort: Pulmonary effort is normal. No respiratory distress.     Breath sounds: Normal breath sounds. No wheezing.  Abdominal:     General: Bowel sounds are normal. There is no distension.     Palpations: Abdomen is soft.     Tenderness: There is no abdominal tenderness.  Musculoskeletal:     Cervical back: Normal range of motion and neck supple.     Comments: Dry gangrene noted in right great toe  Skin:    General: Skin is warm and dry.  Neurological:     Mental Status: He is alert  and oriented to person, place, and time.  Psychiatric:        Mood and Affect: Mood normal.        Behavior: Behavior normal.     Consultants:  Vascular surgery  Procedures:    Data Reviewed: No results found for this or any previous visit (from the past 24 hour(s)).   I have Reviewed nursing notes, Vitals, and Lab results since pt's last encounter. Pertinent lab results : see above I have reviewed the last note from staff over past 24 hours I have discussed pt's care plan and test results with  nursing staff, case manager   LOS: 4 days   Lewie Chamber, MD [facility] 10/23/2021, 11:47 AM

## 2021-10-23 NOTE — Progress Notes (Addendum)
  Progress Note    10/23/2021 7:20 AM Hospital Day 3  Subjective:  wants to know when his breakfast is coming.  Otherwise, no complaints  afebrile  Vitals:   10/22/21 2250 10/23/21 0336  BP: 134/86 137/89  Pulse: 72 71  Resp: 18 17  Temp: 98.4 F (36.9 C) (!) 97.5 F (36.4 C)  SpO2: 94% 94%    Physical Exam: General:  resting comfortably; no distress Lungs:  non labored Extremities:  right great toe with gangrene  CBC    Component Value Date/Time   WBC 7.2 10/21/2021 0121   RBC 4.42 10/21/2021 0121   HGB 12.3 (L) 10/21/2021 0121   HGB 11.9 (L) 03/27/2021 1527   HCT 38.8 (L) 10/21/2021 0121   HCT 35.9 (L) 03/27/2021 1527   PLT 457 (H) 10/21/2021 0121   PLT 463 (H) 03/27/2021 1527   MCV 87.8 10/21/2021 0121   MCV 88 03/27/2021 1527   MCH 27.8 10/21/2021 0121   MCHC 31.7 10/21/2021 0121   RDW 14.6 10/21/2021 0121   RDW 13.2 03/27/2021 1527   LYMPHSABS 1.5 10/21/2021 0121   LYMPHSABS 1.2 03/27/2021 1527   MONOABS 0.4 10/21/2021 0121   EOSABS 0.2 10/21/2021 0121   EOSABS 0.1 03/27/2021 1527   BASOSABS 0.0 10/21/2021 0121   BASOSABS 0.0 03/27/2021 1527    BMET    Component Value Date/Time   NA 133 (L) 10/21/2021 0121   NA 144 03/27/2021 1527   K 4.5 10/21/2021 0121   CL 101 10/21/2021 0121   CO2 26 10/21/2021 0121   GLUCOSE 93 10/21/2021 0121   BUN 24 (H) 10/21/2021 0121   BUN 11 03/27/2021 1527   CREATININE 1.07 10/21/2021 0121   CALCIUM 9.1 10/21/2021 0121   GFRNONAA >60 10/21/2021 0121    INR No results found for: INR   Intake/Output Summary (Last 24 hours) at 10/23/2021 0720 Last data filed at 10/23/2021 [id number] Gross per 24 hour  Intake 690 ml  Output --  Net 690 ml     Assessment/Plan:  66 y.o. male with right great toe gangrene Hospital Day 3  -plan for right femoral artery to popliteal bypass tomorrow in OR with Dr. Donzetta Matters.  -npo after MN/consent/labs ordered. -pt does not have any questions at this time.    Leontine Locket,  PA-C [facility] [phone] 10/23/2021 7:20 AM  I have independently interviewed and examined patient and agree with PA assessment and plan above.  Plan for right common femoral endarterectomy and right femoropopliteal bypass tomorrow with great toe amputation.  I discussed the procedure with him in detail and he demonstrates good understanding.  Will ultimately need evaluation of the left lower extremity in the near future given underlying disease there although much less symptomatic on that side.  Dellamae Rosamilia C. Donzetta Matters, MD [facility] [place] Office: [phone] Pager: [phone]

## 2021-10-23 NOTE — Care Management Important Message (Signed)
Important Message  Patient Details  Name: Paul Madden MRN: [id number] Date of Birth: 1955-06-25   Medicare Important Message Given:  Yes     Addilyn Satterwhite Stefan Church 10/23/2021, 1:57 PM

## 2021-10-24 ENCOUNTER — Inpatient Hospital Stay (HOSPITAL_COMMUNITY): Admission: RE | Admit: 2021-10-24 | Payer: Medicare Other | Source: Home / Self Care | Admitting: Vascular Surgery

## 2021-10-24 ENCOUNTER — Inpatient Hospital Stay (HOSPITAL_COMMUNITY): Payer: Medicare Other | Admitting: Certified Registered Nurse Anesthetist

## 2021-10-24 ENCOUNTER — Encounter (HOSPITAL_COMMUNITY)
Admission: EM | Disposition: A | Discharge status: Home / Self Care | Payer: Self-pay | Source: Home / Self Care | Attending: Internal Medicine

## 2021-10-24 ENCOUNTER — Encounter (HOSPITAL_COMMUNITY): Payer: Self-pay | Admitting: Family Medicine

## 2021-10-24 ENCOUNTER — Other Ambulatory Visit: Payer: Self-pay

## 2021-10-24 DIAGNOSIS — I70235 Atherosclerosis of native arteries of right leg with ulceration of other part of foot: Secondary | ICD-10-CM | POA: Diagnosis not present

## 2021-10-24 DIAGNOSIS — I10 Essential (primary) hypertension: Secondary | ICD-10-CM

## 2021-10-24 DIAGNOSIS — M1990 Unspecified osteoarthritis, unspecified site: Secondary | ICD-10-CM

## 2021-10-24 DIAGNOSIS — M869 Osteomyelitis, unspecified: Secondary | ICD-10-CM | POA: Diagnosis not present

## 2021-10-24 DIAGNOSIS — I70223 Atherosclerosis of native arteries of extremities with rest pain, bilateral legs: Secondary | ICD-10-CM | POA: Diagnosis not present

## 2021-10-24 HISTORY — PX: AMPUTATION TOE: SHX6595

## 2021-10-24 HISTORY — PX: FEMORAL-POPLITEAL BYPASS GRAFT: SHX937

## 2021-10-24 LAB — SURGICAL PCR SCREEN
MRSA, PCR: NEGATIVE
Staphylococcus aureus: NEGATIVE

## 2021-10-24 LAB — BASIC METABOLIC PANEL
Anion gap: 8 (ref 5–15)
BUN: 24 mg/dL — ABNORMAL HIGH (ref 8–23)
CO2: 28 mmol/L (ref 22–32)
Calcium: 9 mg/dL (ref 8.9–10.3)
Chloride: 102 mmol/L (ref 98–111)
Creatinine, Ser: 1.18 mg/dL (ref 0.61–1.24)
GFR, Estimated: >60 mL/min (ref >60)
Glucose, Bld: 101 mg/dL — ABNORMAL HIGH (ref 70–99)
Potassium: 4.7 mmol/L (ref 3.5–5.1)
Sodium: 138 mmol/L (ref 135–145)

## 2021-10-24 LAB — URINALYSIS, ROUTINE W REFLEX MICROSCOPIC
Bilirubin Urine: NEGATIVE
Glucose, UA: NEGATIVE mg/dL
Hgb urine dipstick: NEGATIVE
Ketones, ur: NEGATIVE mg/dL
Leukocytes,Ua: NEGATIVE
Nitrite: NEGATIVE
Protein, ur: NEGATIVE mg/dL
Specific Gravity, Urine: 1.024 (ref 1.005–1.030)
pH: 6 (ref 5.0–8.0)

## 2021-10-24 LAB — CBC WITH DIFFERENTIAL/PLATELET
Abs Immature Granulocytes: 0.04 10*3/uL (ref 0.00–0.07)
Basophils Absolute: 0 10*3/uL (ref 0.0–0.1)
Basophils Relative: 0 %
Eosinophils Absolute: 0.1 10*3/uL (ref 0.0–0.5)
Eosinophils Relative: 2 %
HCT: 37.1 % — ABNORMAL LOW (ref 39.0–52.0)
Hemoglobin: 11.8 g/dL — ABNORMAL LOW (ref 13.0–17.0)
Immature Granulocytes: 1 %
Lymphocytes Relative: 17 %
Lymphs Abs: 1.1 10*3/uL (ref 0.7–4.0)
MCH: 28.2 pg (ref 26.0–34.0)
MCHC: 31.8 g/dL (ref 30.0–36.0)
MCV: 88.8 fL (ref 80.0–100.0)
Monocytes Absolute: 0.4 10*3/uL (ref 0.1–1.0)
Monocytes Relative: 6 %
Neutro Abs: 4.6 10*3/uL (ref 1.7–7.7)
Neutrophils Relative %: 74 %
Platelets: 422 10*3/uL — ABNORMAL HIGH (ref 150–400)
RBC: 4.18 MIL/uL — ABNORMAL LOW (ref 4.22–5.81)
RDW: 14.2 % (ref 11.5–15.5)
WBC: 6.2 10*3/uL (ref 4.0–10.5)
nRBC: 0 % (ref 0.0–0.2)

## 2021-10-24 LAB — MAGNESIUM: Magnesium: 2.3 mg/dL (ref 1.7–2.4)

## 2021-10-24 SURGERY — BYPASS GRAFT FEMORAL-POPLITEAL ARTERY
Anesthesia: General | Site: Toe | Laterality: Right

## 2021-10-24 MED ORDER — DEXAMETHASONE SODIUM PHOSPHATE 10 MG/ML IJ SOLN
INTRAMUSCULAR | Status: DC | PRN
  Administered 2021-10-24: 5 mg via INTRAVENOUS

## 2021-10-24 MED ORDER — PHENYLEPHRINE HCL-NACL 20-0.9 MG/250ML-% IV SOLN
INTRAVENOUS | Status: DC | PRN
  Administered 2021-10-24: 100 ug/min via INTRAVENOUS

## 2021-10-24 MED ORDER — DEXAMETHASONE SODIUM PHOSPHATE 10 MG/ML IJ SOLN
INTRAMUSCULAR | Status: AC
Start: 2021-10-24 — End: ?
  Filled 2021-10-24: qty 1

## 2021-10-24 MED ORDER — ONDANSETRON HCL 4 MG/2ML IJ SOLN
INTRAMUSCULAR | Status: AC
  Filled 2021-10-24: qty 2

## 2021-10-24 MED ORDER — SODIUM CHLORIDE 0.9 % IV SOLN
INTRAVENOUS | Status: DC
Start: 2021-10-24 — End: 2021-10-26

## 2021-10-24 MED ORDER — STERILE WATER FOR IRRIGATION IR SOLN
Status: DC | PRN
  Administered 2021-10-24: 1000 mL

## 2021-10-24 MED ORDER — DOCUSATE SODIUM 100 MG PO CAPS
100.0000 mg | ORAL_CAPSULE | Freq: Every day | ORAL | Status: DC
  Administered 2021-10-25 – 2021-10-26 (×2): 100 mg via ORAL
  Filled 2021-10-24 (×2): qty 1

## 2021-10-24 MED ORDER — EPHEDRINE SULFATE-NACL 50-0.9 MG/10ML-% IV SOSY
PREFILLED_SYRINGE | INTRAVENOUS | Status: DC | PRN
  Administered 2021-10-24: 10 mg via INTRAVENOUS

## 2021-10-24 MED ORDER — PANTOPRAZOLE SODIUM 40 MG PO TBEC
40.0000 mg | DELAYED_RELEASE_TABLET | Freq: Every day | ORAL | Status: DC
Start: 2021-10-24 — End: 2021-10-26
  Administered 2021-10-24 – 2021-10-26 (×3): 40 mg via ORAL
  Filled 2021-10-24 (×3): qty 1

## 2021-10-24 MED ORDER — MAGNESIUM SULFATE 2 GM/50ML IV SOLN: 2.0000 g | Freq: Every day | INTRAVENOUS | Status: DC | PRN

## 2021-10-24 MED ORDER — CHLORHEXIDINE GLUCONATE 0.12 % MT SOLN
15.0000 mL | OROMUCOSAL | Status: AC
  Filled 2021-10-24: qty 15

## 2021-10-24 MED ORDER — CHLORHEXIDINE GLUCONATE 0.12 % MT SOLN
OROMUCOSAL | Status: AC
  Administered 2021-10-24: 15 mL via OROMUCOSAL
  Filled 2021-10-24: qty 15

## 2021-10-24 MED ORDER — ACETAMINOPHEN 500 MG PO TABS
ORAL_TABLET | ORAL | Status: AC
  Filled 2021-10-24: qty 2

## 2021-10-24 MED ORDER — POTASSIUM CHLORIDE CRYS ER 20 MEQ PO TBCR: 20.0000 meq | EXTENDED_RELEASE_TABLET | Freq: Every day | ORAL | Status: DC | PRN

## 2021-10-24 MED ORDER — SODIUM CHLORIDE 0.9 % IV SOLN: 500.0000 mL | Freq: Once | INTRAVENOUS | Status: DC | PRN

## 2021-10-24 MED ORDER — HEPARIN SODIUM (PORCINE) 1000 UNIT/ML IJ SOLN
INTRAMUSCULAR | Status: DC | PRN
  Administered 2021-10-24: 3000 [IU] via INTRAVENOUS
  Administered 2021-10-24: 6000 [IU] via INTRAVENOUS

## 2021-10-24 MED ORDER — LIDOCAINE-EPINEPHRINE 1 %-1:200000 IJ SOLN
INTRAMUSCULAR | Status: AC
Start: 2021-10-24 — End: ?
  Filled 2021-10-24: qty 30

## 2021-10-24 MED ORDER — HEPARIN 6000 UNIT IRRIGATION SOLUTION
Status: AC
  Filled 2021-10-24: qty 500

## 2021-10-24 MED ORDER — CHLORHEXIDINE GLUCONATE CLOTH 2 % EX PADS
6.0000 | MEDICATED_PAD | Freq: Every day | CUTANEOUS | Status: DC
  Administered 2021-10-24 – 2021-10-26 (×3): 6 via TOPICAL

## 2021-10-24 MED ORDER — PROPOFOL 10 MG/ML IV BOLUS
INTRAVENOUS | Status: DC | PRN
  Administered 2021-10-24: 50 mg via INTRAVENOUS
  Administered 2021-10-24: 100 mg via INTRAVENOUS
  Administered 2021-10-24: 150 mg via INTRAVENOUS

## 2021-10-24 MED ORDER — MIDAZOLAM HCL 2 MG/2ML IJ SOLN
INTRAMUSCULAR | Status: AC
  Filled 2021-10-24: qty 2

## 2021-10-24 MED ORDER — PHENOL 1.4 % MT LIQD: 1.0000 | OROMUCOSAL | Status: DC | PRN

## 2021-10-24 MED ORDER — PROTAMINE SULFATE 10 MG/ML IV SOLN
INTRAVENOUS | Status: DC | PRN
  Administered 2021-10-24: 50 mg via INTRAVENOUS

## 2021-10-24 MED ORDER — GUAIFENESIN-DM 100-10 MG/5ML PO SYRP
15.0000 mL | ORAL_SOLUTION | ORAL | Status: DC | PRN
Start: 2021-10-24 — End: 2021-10-26

## 2021-10-24 MED ORDER — HEPARIN 6000 UNIT IRRIGATION SOLUTION
Status: DC | PRN
  Administered 2021-10-24: 1

## 2021-10-24 MED ORDER — MIDAZOLAM HCL 2 MG/2ML IJ SOLN
INTRAMUSCULAR | Status: DC | PRN
  Administered 2021-10-24: 2 mg via INTRAVENOUS

## 2021-10-24 MED ORDER — PHENYLEPHRINE 80 MCG/ML (10ML) SYRINGE FOR IV PUSH (FOR BLOOD PRESSURE SUPPORT): PREFILLED_SYRINGE | INTRAVENOUS | Status: DC | PRN

## 2021-10-24 MED ORDER — HEMOSTATIC AGENTS (NO CHARGE) OPTIME
TOPICAL | Status: DC | PRN
  Administered 2021-10-24: 1 via TOPICAL

## 2021-10-24 MED ORDER — FENTANYL CITRATE (PF) 250 MCG/5ML IJ SOLN
INTRAMUSCULAR | Status: DC | PRN
Start: 2021-10-24 — End: 2021-10-24
  Administered 2021-10-24 (×5): 50 ug via INTRAVENOUS

## 2021-10-24 MED ORDER — DEXMEDETOMIDINE (PRECEDEX) IN NS 20 MCG/5ML (4 MCG/ML) IV SYRINGE
PREFILLED_SYRINGE | INTRAVENOUS | Status: DC | PRN
  Administered 2021-10-24: 4 ug via INTRAVENOUS
  Administered 2021-10-24: 8 ug via INTRAVENOUS
  Administered 2021-10-24: 4 ug via INTRAVENOUS

## 2021-10-24 MED ORDER — KETAMINE HCL 50 MG/5ML IJ SOSY
PREFILLED_SYRINGE | INTRAMUSCULAR | Status: AC
  Filled 2021-10-24: qty 5

## 2021-10-24 MED ORDER — 0.9 % SODIUM CHLORIDE (POUR BTL) OPTIME
TOPICAL | Status: DC | PRN
  Administered 2021-10-24 (×2): 1000 mL

## 2021-10-24 MED ORDER — SUGAMMADEX SODIUM 200 MG/2ML IV SOLN
INTRAVENOUS | Status: DC | PRN
  Administered 2021-10-24: 200 mg via INTRAVENOUS

## 2021-10-24 MED ORDER — OXYCODONE-ACETAMINOPHEN 5-325 MG PO TABS
1.0000 | ORAL_TABLET | ORAL | Status: DC | PRN
  Administered 2021-10-24: 1 via ORAL
  Administered 2021-10-24 – 2021-10-26 (×8): 2 via ORAL
  Filled 2021-10-24 (×3): qty 2
  Filled 2021-10-24: qty 1
  Filled 2021-10-24 (×5): qty 2

## 2021-10-24 MED ORDER — FENTANYL CITRATE (PF) 100 MCG/2ML IJ SOLN: 25.0000 ug | INTRAMUSCULAR | Status: DC | PRN

## 2021-10-24 MED ORDER — LACTATED RINGERS IV SOLN: INTRAVENOUS | Status: DC

## 2021-10-24 MED ORDER — PHENYLEPHRINE 80 MCG/ML (10ML) SYRINGE FOR IV PUSH (FOR BLOOD PRESSURE SUPPORT)
PREFILLED_SYRINGE | INTRAVENOUS | Status: DC | PRN
  Administered 2021-10-24 (×2): 160 ug via INTRAVENOUS

## 2021-10-24 MED ORDER — FENTANYL CITRATE (PF) 250 MCG/5ML IJ SOLN
INTRAMUSCULAR | Status: AC
  Filled 2021-10-24: qty 5

## 2021-10-24 MED ORDER — ROCURONIUM BROMIDE 10 MG/ML (PF) SYRINGE
PREFILLED_SYRINGE | INTRAVENOUS | Status: DC | PRN
  Administered 2021-10-24 (×2): 50 mg via INTRAVENOUS

## 2021-10-24 MED ORDER — LIDOCAINE 2% (20 MG/ML) 5 ML SYRINGE
INTRAMUSCULAR | Status: DC | PRN
  Administered 2021-10-24: 60 mg via INTRAVENOUS

## 2021-10-24 MED ORDER — ALBUMIN HUMAN 5 % IV SOLN: INTRAVENOUS | Status: DC | PRN

## 2021-10-24 MED ORDER — CEFAZOLIN SODIUM-DEXTROSE 2-4 GM/100ML-% IV SOLN
2.0000 g | Freq: Three times a day (TID) | INTRAVENOUS | Status: AC
  Administered 2021-10-24 (×2): 2 g via INTRAVENOUS
  Filled 2021-10-24 (×2): qty 100

## 2021-10-24 MED ORDER — DEXMEDETOMIDINE (PRECEDEX) IN NS 20 MCG/5ML (4 MCG/ML) IV SYRINGE
PREFILLED_SYRINGE | INTRAVENOUS | Status: AC
  Filled 2021-10-24: qty 5

## 2021-10-24 MED ORDER — ALUM & MAG HYDROXIDE-SIMETH 200-200-20 MG/5ML PO SUSP: 15.0000 mL | ORAL | Status: DC | PRN

## 2021-10-24 MED ORDER — LIDOCAINE-EPINEPHRINE (PF) 1 %-1:200000 IJ SOLN
INTRAMUSCULAR | Status: DC | PRN
  Administered 2021-10-24: 7 mL

## 2021-10-24 MED ORDER — ONDANSETRON HCL 4 MG/2ML IJ SOLN
INTRAMUSCULAR | Status: AC
  Filled 2021-10-24: qty 4

## 2021-10-24 SURGICAL SUPPLY — 54 items
BNDG ELASTIC 4X5.8 VLCR STR LF (GAUZE/BANDAGES/DRESSINGS) ×1 IMPLANT
BNDG GAUZE ELAST 4 BULKY (GAUZE/BANDAGES/DRESSINGS) ×1 IMPLANT
CANISTER SUCT 3000ML PPV (MISCELLANEOUS) ×3 IMPLANT
CANNULA VESSEL 3MM 2 BLNT TIP (CANNULA) ×1 IMPLANT
CLIP LIGATING EXTRA MED SLVR (CLIP) ×3 IMPLANT
CLIP LIGATING EXTRA SM BLUE (MISCELLANEOUS) ×3 IMPLANT
COVER PROBE W/GEL STERILE (IV SETS) ×1 IMPLANT
DERMABOND ADVANCED (GAUZE/BANDAGES/DRESSINGS) ×2
DERMABOND ADVANCED .7 DNX12 (GAUZE/BANDAGES/DRESSINGS) ×2 IMPLANT
DRAPE HALF SHEET 40X57 (DRAPES) ×1 IMPLANT
DRSG EMULSION OIL 3X3 NADH (GAUZE/BANDAGES/DRESSINGS) ×1 IMPLANT
DRSG XEROFORM 1X8 (GAUZE/BANDAGES/DRESSINGS) ×1 IMPLANT
ELECT REM PT RETURN 9FT ADLT (ELECTROSURGICAL) ×3
ELECTRODE REM PT RTRN 9FT ADLT (ELECTROSURGICAL) ×2 IMPLANT
GAUZE SPONGE 4X4 12PLY STRL (GAUZE/BANDAGES/DRESSINGS) ×1 IMPLANT
GLOVE BIO SURGEON STRL SZ 6.5 (GLOVE) ×1 IMPLANT
GLOVE BIO SURGEON STRL SZ7.5 (GLOVE) ×4 IMPLANT
GLOVE BIOGEL M 7.0 STRL (GLOVE) ×2 IMPLANT
GLOVE BIOGEL M STRL SZ7.5 (GLOVE) ×1 IMPLANT
GLOVE BIOGEL PI IND STRL 6.5 (GLOVE) IMPLANT
GLOVE BIOGEL PI INDICATOR 6.5 (GLOVE) ×1
GOWN STRL REUS W/ TWL LRG LVL3 (GOWN DISPOSABLE) ×4 IMPLANT
GOWN STRL REUS W/ TWL XL LVL3 (GOWN DISPOSABLE) ×2 IMPLANT
GOWN STRL REUS W/TWL LRG LVL3 (GOWN DISPOSABLE) ×4
GOWN STRL REUS W/TWL XL LVL3 (GOWN DISPOSABLE) ×1
HEMOSTAT SNOW SURGICEL 2X4 (HEMOSTASIS) IMPLANT
INSERT FOGARTY SM (MISCELLANEOUS) ×1 IMPLANT
KIT BASIN OR (CUSTOM PROCEDURE TRAY) ×3 IMPLANT
KIT TURNOVER KIT B (KITS) ×3 IMPLANT
NDL HYPO 25GX1X1/2 BEV (NEEDLE) IMPLANT
NEEDLE HYPO 25GX1X1/2 BEV (NEEDLE) ×3 IMPLANT
NS IRRIG 1000ML POUR BTL (IV SOLUTION) ×6 IMPLANT
PACK PERIPHERAL VASCULAR (CUSTOM PROCEDURE TRAY) ×3 IMPLANT
PAD ARMBOARD 7.5X6 YLW CONV (MISCELLANEOUS) ×6 IMPLANT
POWDER SURGICEL 3.0 GRAM (HEMOSTASIS) ×1 IMPLANT
SPONGE T-LAP 18X18 ~~LOC~~+RFID (SPONGE) ×1 IMPLANT
SUT ETHILON 2 0 PSLX (SUTURE) ×1 IMPLANT
SUT MNCRL AB 4-0 PS2 18 (SUTURE) ×6 IMPLANT
SUT PROLENE 5 0 C 1 24 (SUTURE) ×4 IMPLANT
SUT PROLENE 5 0 C 1 36 (SUTURE) ×1 IMPLANT
SUT PROLENE 5 0 CC 1 (SUTURE) ×2 IMPLANT
SUT PROLENE 6 0 BV (SUTURE) ×5 IMPLANT
SUT SILK 2 0 SH (SUTURE) ×3 IMPLANT
SUT SILK 3 0 (SUTURE)
SUT SILK 3-0 18XBRD TIE 12 (SUTURE) IMPLANT
SUT VIC AB 2-0 CT1 27 (SUTURE) ×2
SUT VIC AB 2-0 CT1 TAPERPNT 27 (SUTURE) ×4 IMPLANT
SUT VIC AB 3-0 SH 27 (SUTURE) ×2
SUT VIC AB 3-0 SH 27X BRD (SUTURE) ×4 IMPLANT
SYR CONTROL 10ML LL (SYRINGE) ×1 IMPLANT
TOWEL GREEN STERILE (TOWEL DISPOSABLE) ×3 IMPLANT
TRAY FOLEY MTR SLVR 16FR STAT (SET/KITS/TRAYS/PACK) ×3 IMPLANT
UNDERPAD 30X36 HEAVY ABSORB (UNDERPADS AND DIAPERS) ×3 IMPLANT
WATER STERILE IRR 1000ML POUR (IV SOLUTION) ×3 IMPLANT

## 2021-10-24 NOTE — Progress Notes (Signed)
  Progress Note    10/24/2021 9:03 AM Day of Surgery  Subjective:  no overnight issues  Vitals:   10/24/21 0745 10/24/21 0842  BP: 133/75 (!) 143/83  Pulse: (!) 57 63  Resp: 18 18  Temp: 98.1 F (36.7 C) 98.6 F (37 C)  SpO2: 94% 96%    Physical Exam: Aaox3 R CF pulse 1+ R great with stable uninfected necrosis  CBC    Component Value Date/Time   WBC 6.2 10/24/2021 0148   RBC 4.18 (L) 10/24/2021 0148   HGB 11.8 (L) 10/24/2021 0148   HGB 11.9 (L) 03/27/2021 1527   HCT 37.1 (L) 10/24/2021 0148   HCT 35.9 (L) 03/27/2021 1527   PLT 422 (H) 10/24/2021 0148   PLT 463 (H) 03/27/2021 1527   MCV 88.8 10/24/2021 0148   MCV 88 03/27/2021 1527   MCH 28.2 10/24/2021 0148   MCHC 31.8 10/24/2021 0148   RDW 14.2 10/24/2021 0148   RDW 13.2 03/27/2021 1527   LYMPHSABS 1.1 10/24/2021 0148   LYMPHSABS 1.2 03/27/2021 1527   MONOABS 0.4 10/24/2021 0148   EOSABS 0.1 10/24/2021 0148   EOSABS 0.1 03/27/2021 1527   BASOSABS 0.0 10/24/2021 0148   BASOSABS 0.0 03/27/2021 1527    BMET    Component Value Date/Time   NA 138 10/24/2021 0148   NA 144 03/27/2021 1527   K 4.7 10/24/2021 0148   CL 102 10/24/2021 0148   CO2 28 10/24/2021 0148   GLUCOSE 101 (H) 10/24/2021 0148   BUN 24 (H) 10/24/2021 0148   BUN 11 03/27/2021 1527   CREATININE 1.18 10/24/2021 0148   CALCIUM 9.0 10/24/2021 0148   GFRNONAA >60 10/24/2021 0148    INR    Component Value Date/Time   INR 1.1 10/23/2021 1629     Intake/Output Summary (Last 24 hours) at 10/24/2021 0903 Last data filed at 10/24/2021 0746 Gross per 24 hour  Intake 240 ml  Output --  Net 240 ml     Assessment/plan:  66 y.o. male is here with gangrene right great toe. Plan for R fem-pop with vein and GT amputation.    Codi Kertz C. Donzetta Matters, MD [facility] [place] Office: [phone] Pager: [phone]  10/24/2021 9:03 AM

## 2021-10-24 NOTE — Transfer of Care (Signed)
Immediate Anesthesia Transfer of Care Note  Patient: Paul Madden  Procedure(s) Performed: RIGHT BYPASS GRAFT COMMON FEMORAL-POPLITEAL ARTERY (Right: Groin) AMPUTATION OF THE RIGHT GREAT TOE (Right: Toe)  Patient Location: PACU  Anesthesia Type:General  Level of Consciousness: awake and alert   Airway & Oxygen Therapy: Patient Spontanous Breathing and Patient connected to nasal cannula oxygen  Post-op Assessment: Report given to RN and Post -op Vital signs reviewed and stable  Post vital signs: Reviewed and stable  Last Vitals:  Vitals Value Taken Time  BP 139/80 10/24/21 1355  Temp    Pulse 80 10/24/21 1401  Resp 33 10/24/21 1401  SpO2 100 % 10/24/21 1401  Vitals shown include unvalidated device data.  Last Pain:  Vitals:   10/24/21 1300  TempSrc:   PainSc: 0-No pain      Patients Stated Pain Goal: 2 (10/23/21 0400)  Complications: No notable events documented.

## 2021-10-24 NOTE — Progress Notes (Signed)
Pt arrived to unit from  PACU A/O x 4,  pt oriented to unit, Right foot wrapped and calf incision skin glue. Pt has old inguinal hernia at groin site. Will continue to monitor.   Karna Christmas Jaxson Keener, RN    10/24/21 1433  Vitals  Temp 97.6 F (36.4 C) (Simultaneous filing. User may not have seen previous data.)  Temp Source Oral  BP (!) 142/80  MAP (mmHg) 97  BP Location Right Arm  BP Method Automatic  Patient Position (if appropriate) Lying  Pulse Rate 73  Pulse Rate Source Monitor  ECG Heart Rate 76  Resp 12  Level of Consciousness  Level of Consciousness Alert (Simultaneous filing. User may not have seen previous data.)  Oxygen Therapy  SpO2  (98  Simultaneous filing. User may not have seen previous data.)  O2 Device Room Air (Simultaneous filing. User may not have seen previous data.)  O2 Flow Rate (L/min) 0 L/min  Pulse Oximetry Type Continuous  ECG Monitoring  Cardiac Rhythm NSR  Pain Assessment  Pain Scale 0-10  Pain Score 0  MEWS Score  MEWS Temp 0  MEWS Systolic 0  MEWS Pulse 0  MEWS RR 1  MEWS LOC 0  MEWS Score 1  MEWS Score Color [place] Si

## 2021-10-24 NOTE — Progress Notes (Signed)
PHARMACIST LIPID MONITORING   Paul Madden is a 66 y.o. male admitted on 10/19/2021 with severe bilateral PAD.  Pharmacy has been consulted to optimize lipid-lowering therapy with the indication of secondary prevention for clinical ASCVD.  Recent Labs:  Lipid Panel (last 6 months):   Lab Results  Component Value Date   CHOL 120 10/21/2021   TRIG 40 10/21/2021   HDL 49 10/21/2021   CHOLHDL 2.4 10/21/2021   VLDL 8 10/21/2021   LDLCALC 63 10/21/2021    Hepatic function panel (last 6 months):   Lab Results  Component Value Date   AST 28 10/23/2021   ALT 17 10/23/2021   ALKPHOS 74 10/23/2021   BILITOT 0.2 (L) 10/23/2021    SCr (since admission):   Serum creatinine: 1.18 mg/dL 34/74/25 9563 Estimated creatinine clearance: 50.1 mL/min  Current therapy and lipid therapy tolerance Current lipid-lowering therapy: Lipitor 80 mg daily Previous lipid-lowering therapies (if applicable): none Documented or reported allergies or intolerances to lipid-lowering therapies (if applicable): none  Assessment:   No changes recommended, patient already on high intensity statin  Plan:    1.Statin intensity (high intensity recommended for all patients regardless of the LDL):  No statin changes. The patient is already on a high intensity statin.  2.Add ezetimibe (if any one of the following):   Not indicated at this time.  3.Refer to lipid clinic:   No  4.Follow-up with:  Primary care provider - Anabel Halon, MD  5.Follow-up labs after discharge:  No changes in lipid therapy, repeat a lipid panel in one year.        Thank you for allowing [place] to participate in this patients care. Signe Colt, PharmD 10/24/2021 3:06 PM  **Pharmacist phone directory can be found on [email or web address] listed under [facility]**

## 2021-10-24 NOTE — Op Note (Signed)
Patient name: Paul Madden MRN: [id number] DOB: July 20, 1955 Sex: male  10/24/2021 Pre-operative Diagnosis: Chronic right lower extremity limb threatening ischemia with great toe ulceration Post-operative diagnosis:  Same Surgeon:  Luanna Salk. Randie Heinz, MD Assistants: Nathanial Rancher, PA; Loel Dubonnet, [place] Procedure Performed: 1.  Harvest right greater saphenous vein 2.  Right common femoral endarterectomy 3.  Right common femoral to below-knee popliteal artery bypass with nonreversed, ipsilateral, translocated greater saphenous vein 4.  Right great toe amputation  Indications: 66 year old male with great toe ulceration found to have severely depressed ABIs and by CT scan has severe common femoral disease with an occluded SFA reconstitutes below the knee popliteal artery which there is no calcium in the anterior tibial artery appears to be the dominant runoff although is calcified proximally.  He is indicated for the above procedure.  Findings: Common femoral artery was subtotally occluded with heavy calcification.  After endarterectomy there was strong inflow actually some backbleeding from the SFA and very strong backbleeding from the profunda femoris artery.  The greater saphenous vein was harvested and maximal diameter was approximately 4-1/2 mm but was suitable for bypass throughout.  The below-knee popliteal artery was soft and amenable for bypass.  At completion there was a very strong anterior tibial artery signal at the ankle that was graft dependent.  The right great toe was amputated back to healthy tissue.   Procedure:  The patient was identified in the holding area and taken to the operating room where is placed supine operative when general anesthesia was induced.  He was sterilely prepped and draped in the right lower extremity usual fashion, antibiotics were ministered a timeout was called.  Ultrasound was used to identify the vein marked throughout its course in the right lower extremity.   A vertical incision was then made overlying the palpable common femoral artery and this was dissected out including the profunda, SFA under the inguinal ligament we divided the crossing vein and placed Vesseloops around the external iliac artery.  Through the same incision we identified the greater saphenous vein with 2 incisions above the knee and one below we dissected out the entirety of this.  Distally we tied it off transected it.  Branches were divided between clips and ties.  Saphenofemoral junction was clamped with side-biting clamp and transected and passed to the back table for further preparation.  The saphenofemoral junction was oversewn with running 5-0 Prolene suture in a mattress fashion.  Attention was then turned below the knee where we deepened the incision from the vein harvest down to the fascia identified the popliteal veins that were paired retracted the medial 1 and identified the popliteal artery which was soft and amenable for bypass and a vessel loop was placed around this.  A tunneler was then placed between the 2 incisions in the groin and below the knee.  Patient was fully heparinized.  Common femoral outflow followed by inflow was clamped and the artery was opened longitudinally and extensive endarterectomy was performed and at completion there was very strong inflow and good backbleeding from the profunda and some backbleeding from the SFA.  The vein was then spatulated nonreversed and sewn in place as a patch of the common femoral artery leading into the bypass.  Upon completion we then flushed through the bypass graft.  The valves were lysed with valvulotome and there was strong pulsatile bleeding.  The vein was doubly clipped distally and marked for orientation and then tunneled anatomically.  Below the knee we  reexposed the bloody popliteal artery clamped distally and proximally opened longitudinally there was good backbleeding there.  The leg was straight and the vein was trimmed  to size spatulated and sewn into side with 6-0 Prolene suture.  Prior completion without flushing all directions.  Upon completion was a very strong signal in the wound bed as well as at the anterior tibial at the ankle that was graft dependent.  Satisfied with this we administered 50 mg of protamine.  We obtained meticulous hemostasis and thoroughly irrigated all the wounds and then were closed in layers with Vicryl and Monocryl.  Dermabond is placed at the skin level.  Attention was turned to the right foot.  A fishmouth type incision was made around the middle of the right great toe.  We dissected down to the level of bone.  The bone was taken back to the metatarsal phalangeal joint and removed en bloc.  We then removed the joint capsule of the first metatarsal bone.  This was smoothed with a rasp.  The wound was thoroughly irrigated with saline and then closed at the skin level with interrupted nylon sutures.  Patient was then awakened from anesthesia having tolerated procedure without any complication.  All counts were correct at completion.   Given the complexity of the case,  the assistant was necessary in order to expedient the procedure and safely perform the technical aspects of the operation.  The assistant provided traction and countertraction to assist with exposure of the artery proximally and distally.  They assisted with suture ligature of multiple branches.  Their assistance was critical in the performance of both the proximal and distal anastomosis.These skills, especially following the Prolene suture for the anastomosis, could not have been adequately performed by a scrub tech assistant.    EBL: 500cc   [name] C. Randie Heinz, MD [facility] [name] Office: [phone] Pager: [phone]

## 2021-10-24 NOTE — Anesthesia Preprocedure Evaluation (Signed)
Anesthesia Evaluation  Patient identified by MRN, date of birth, ID band Patient awake    Reviewed: Allergy & Precautions, NPO status , Patient's Chart, lab work & pertinent test results  Airway Mallampati: II  TM Distance: >3 FB Neck ROM: Full    Dental  (+) Poor Dentition, Missing   Pulmonary neg pulmonary ROS, Current Smoker and Patient abstained from smoking.,    Pulmonary exam normal        Cardiovascular hypertension, + Peripheral Vascular Disease   Rhythm:Regular Rate:Normal     Neuro/Psych negative neurological ROS  negative psych ROS   GI/Hepatic negative GI ROS, (+)     substance abuse  alcohol use,   Endo/Other    Renal/GU   negative genitourinary   Musculoskeletal  (+) Arthritis , Osteoarthritis,    Abdominal Normal abdominal exam  (+)   Peds  Hematology   Anesthesia Other Findings   Reproductive/Obstetrics                             Anesthesia Physical Anesthesia Plan  ASA: 3  Anesthesia Plan: General   Post-op Pain Management:    Induction: Intravenous  PONV Risk Score and Plan: 1 and Ondansetron, Dexamethasone, Midazolam and Treatment may vary due to age or medical condition  Airway Management Planned: Mask and Oral ETT  Additional Equipment: Arterial line  Intra-op Plan:   Post-operative Plan: Extubation in OR  Informed Consent: I have reviewed the patients History and Physical, chart, labs and discussed the procedure including the risks, benefits and alternatives for the proposed anesthesia with the patient or authorized representative who has indicated his/her understanding and acceptance.     Dental advisory given  Plan Discussed with: CRNA  Anesthesia Plan Comments: (Lab Results      Component                Value               Date                      WBC                      6.2                 10/24/2021                HGB                       11.8 (L)            10/24/2021                HCT                      37.1 (L)            10/24/2021                MCV                      88.8                10/24/2021                PLT  422 (H)             10/24/2021           Lab Results      Component                Value               Date                      NA                       138                 10/24/2021                K                        4.7                 10/24/2021                CO2                      28                  10/24/2021                GLUCOSE                  101 (H)             10/24/2021                BUN                      24 (H)              10/24/2021                CREATININE               1.18                10/24/2021                CALCIUM                  9.0                 10/24/2021                EGFR                     63                  03/27/2021                GFRNONAA                 >60                 10/24/2021          )        Anesthesia Quick Evaluation

## 2021-10-24 NOTE — Progress Notes (Signed)
Progress Note    Paul Madden   XIP:[id number]  DOB: 1955-08-24  DOA: 10/19/2021     5 PCP: Anabel Halon, MD  Initial CC: Right foot pain  Hospital Course: Paul Madden is a 66 y.o. male with PMH HTN, tobacco use, etoh use, substance abuse who presented with right foot pain.  He also endorsed developing right calf pain at times after walking and noted that resting did help his pain. He was admitted for further vascular work-up.  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  Vascular surgery was consulted on admission.  Interval History:  No events overnight. Seen in room after return from OR. Sleepy and comfortable. Tolerated surgery well.   Assessment and Plan: * Critical limb ischemia of both lower extremities (HCC) - Outpatient ABIs show critical limb ischemia in the bilateral lower extremities - CTA shows B/L PAD. See full report -Vascular surgery following, appreciate assistance - underwent right common femoral endarterectomy and R CFA to below-knee popliteal artery bypass with nonreversed, ipisilateral, translocated greater saphenous vein on 5/30 - continue HSQ, asa, and lipitor  Osteomyelitis of toe of right foot (HCC) - Dry gangrene with osteomyelitis noted on x-ray - nontoxic appearing; no s/s sepsis; hold off on abx -Appreciate vascular surgery involvement.  Underwent right great toe amputation on 10/24/2021  Alcohol use - Patient drinks 4-6 beers per day -Denies ever having withdrawals - no concern for withdrawal; d/c CIWA  Substance abuse (HCC) - Last cocaine use was the day prior to admission -Counseled on the importance of cessation -TOC consult  Tobacco abuse - Counseled on the importance of cessation -Counseled on the association between cigarettes and peripheral vascular disease -1 pack/day smoker, 21 mg  nicotine patch for cravings   Essential hypertension - Continue amlodipine -Use labetalol or hydralazine as needed as well    Old records reviewed in assessment of this patient  Antimicrobials:   DVT prophylaxis:  SCD's Start: 10/24/21 1441 heparin injection 5,000 Units Start: 10/19/21 2300 SCDs Start: 10/19/21 2210 SCDs Start: 10/19/21 2210   Code Status:   Code Status: Full Code  Disposition Plan: Home Status is: Inpatient  Objective: Blood pressure 139/77, pulse 69, temperature (!) 97.3 F (36.3 C), temperature source Oral, resp. rate 20, height 5\' 9"  (1.753 m), weight 56.7 kg, SpO2 95 %.  Examination:  Physical Exam Constitutional:      General: He is not in acute distress.    Appearance: Normal appearance.  HENT:     Head: Normocephalic and atraumatic.     Mouth/Throat:     Mouth: Mucous membranes are moist.  Eyes:     Extraocular Movements: Extraocular movements intact.  Cardiovascular:     Rate and Rhythm: Normal rate and regular rhythm.     Heart sounds: Normal heart sounds.  Pulmonary:     Effort: Pulmonary effort is normal. No respiratory distress.     Breath sounds: Normal breath sounds. No wheezing.  Abdominal:     General: Bowel sounds are normal. There is no distension.     Palpations: Abdomen is soft.     Tenderness: There is no abdominal tenderness.  Musculoskeletal:     Cervical back: Normal range of motion and neck supple.     Comments: Right foot now wrapped with surgical bandage.  Right lower extremity noted with surgical incisions  Skin:  General: Skin is warm and dry.  Neurological:     Mental Status: He is alert and oriented to person, place, and time.  Psychiatric:        Mood and Affect: Mood normal.        Behavior: Behavior normal.     Consultants:  Vascular surgery  Procedures:  Right great toe amputation, 10/24/2021 Right common femoral endarterectomy, right common femoral to below-knee popliteal artery bypass with  nonreversed, ipsilateral, translocated greater saphenous vein, 10/24/2021  Data Reviewed: Results for orders placed or performed during the hospital encounter of 10/19/21 (from the past 24 hour(s))  CBC     Status: Abnormal   Collection Time: 10/23/21  4:29 PM  Result Value Ref Range   WBC 6.2 4.0 - 10.5 K/uL   RBC 4.36 4.22 - 5.81 MIL/uL   Hemoglobin 12.3 (L) 13.0 - 17.0 g/dL   HCT 29.538.3 (L) 28.439.0 - 13.252.0 %   MCV 87.8 80.0 - 100.0 fL   MCH 28.2 26.0 - 34.0 pg   MCHC 32.1 30.0 - 36.0 g/dL   RDW 44.014.3 10.211.5 - 72.515.5 %   Platelets 421 (H) 150 - 400 K/uL   nRBC 0.0 0.0 - 0.2 %  Comprehensive metabolic panel     Status: Abnormal   Collection Time: 10/23/21  4:29 PM  Result Value Ref Range   Sodium 139 135 - 145 mmol/L   Potassium 4.7 3.5 - 5.1 mmol/L   Chloride 105 98 - 111 mmol/L   CO2 29 22 - 32 mmol/L   Glucose, Bld 118 (H) 70 - 99 mg/dL   BUN 26 (H) 8 - 23 mg/dL   Creatinine, Ser 3.661.24 0.61 - 1.24 mg/dL   Calcium 9.0 8.9 - 44.010.3 mg/dL   Total Protein 6.9 6.5 - 8.1 g/dL   Albumin 3.2 (L) 3.5 - 5.0 g/dL   AST 28 15 - 41 U/L   ALT 17 0 - 44 U/L   Alkaline Phosphatase 74 38 - 126 U/L   Total Bilirubin 0.2 (L) 0.3 - 1.2 mg/dL   GFR, Estimated >34>60 >74>60 mL/min   Anion gap 5 5 - 15  Protime-INR     Status: None   Collection Time: 10/23/21  4:29 PM  Result Value Ref Range   Prothrombin Time 14.5 11.4 - 15.2 seconds   INR 1.1 0.8 - 1.2  APTT     Status: None   Collection Time: 10/23/21  4:29 PM  Result Value Ref Range   aPTT 36 24 - 36 seconds  Type and screen     Status: None   Collection Time: 10/23/21  4:29 PM  Result Value Ref Range   ABO/RH(D) A POS    Antibody Screen NEG    Sample Expiration      10/26/2021,2359 Performed at [facility], [address] [place], [place] [address]   Surgical PCR screen     Status: None   Collection Time: 10/23/21 10:26 PM   Specimen: Nasal Mucosa; Nasal Swab  Result Value Ref Range   MRSA, PCR NEGATIVE NEGATIVE   Staphylococcus  aureus NEGATIVE NEGATIVE  Magnesium     Status: None   Collection Time: 10/24/21  1:48 AM  Result Value Ref Range   Magnesium 2.3 1.7 - 2.4 mg/dL  Basic metabolic panel     Status: Abnormal   Collection Time: 10/24/21  1:48 AM  Result Value Ref Range   Sodium 138 135 - 145 mmol/L   Potassium 4.7 3.5 -  5.1 mmol/L   Chloride 102 98 - 111 mmol/L   CO2 28 22 - 32 mmol/L   Glucose, Bld 101 (H) 70 - 99 mg/dL   BUN 24 (H) 8 - 23 mg/dL   Creatinine, Ser 1.61 0.61 - 1.24 mg/dL   Calcium 9.0 8.9 - 09.6 mg/dL   GFR, Estimated >04 >54 mL/min   Anion gap 8 5 - 15  CBC with Differential/Platelet     Status: Abnormal   Collection Time: 10/24/21  1:48 AM  Result Value Ref Range   WBC 6.2 4.0 - 10.5 K/uL   RBC 4.18 (L) 4.22 - 5.81 MIL/uL   Hemoglobin 11.8 (L) 13.0 - 17.0 g/dL   HCT 09.8 (L) 11.9 - 14.7 %   MCV 88.8 80.0 - 100.0 fL   MCH 28.2 26.0 - 34.0 pg   MCHC 31.8 30.0 - 36.0 g/dL   RDW 82.9 56.2 - 13.0 %   Platelets 422 (H) 150 - 400 K/uL   nRBC 0.0 0.0 - 0.2 %   Neutrophils Relative % 74 %   Neutro Abs 4.6 1.7 - 7.7 K/uL   Lymphocytes Relative 17 %   Lymphs Abs 1.1 0.7 - 4.0 K/uL   Monocytes Relative 6 %   Monocytes Absolute 0.4 0.1 - 1.0 K/uL   Eosinophils Relative 2 %   Eosinophils Absolute 0.1 0.0 - 0.5 K/uL   Basophils Relative 0 %   Basophils Absolute 0.0 0.0 - 0.1 K/uL   Immature Granulocytes 1 %   Abs Immature Granulocytes 0.04 0.00 - 0.07 K/uL     I have Reviewed nursing notes, Vitals, and Lab results since pt's last encounter. Pertinent lab results : see above I have reviewed the last note from staff over past 24 hours I have discussed pt's care plan and test results with nursing staff, case manager   LOS: 5 days   Lewie Chamber, MD [facility] 10/24/2021, 4:22 PM

## 2021-10-24 NOTE — Anesthesia Postprocedure Evaluation (Signed)
Anesthesia Post Note  Patient: Paul Madden  Procedure(s) Performed: RIGHT BYPASS GRAFT COMMON FEMORAL-POPLITEAL ARTERY (Right: Groin) AMPUTATION OF THE RIGHT GREAT TOE (Right: Toe)     Patient location during evaluation: PACU Anesthesia Type: General Level of consciousness: awake and alert Pain management: pain level controlled Vital Signs Assessment: post-procedure vital signs reviewed and stable Respiratory status: spontaneous breathing, nonlabored ventilation, respiratory function stable and patient connected to nasal cannula oxygen Cardiovascular status: blood pressure returned to baseline and stable Postop Assessment: no apparent nausea or vomiting Anesthetic complications: no   No notable events documented.  Last Vitals:  Vitals:   10/24/21 1410 10/24/21 1418  BP: 140/78 137/77  Pulse: 85 82  Resp: 20 17  Temp:    SpO2: 96% 98%    Last Pain:  Vitals:   10/24/21 1355  TempSrc:   PainSc: 0-No pain                 [name] P Jermon Chalfant

## 2021-10-24 NOTE — Evaluation (Signed)
Physical Therapy Evaluation Patient Details Name: Paul Madden MRN: [id number] DOB: 09-09-1955 Today's Date: 10/24/2021  History of Present Illness  Pt is 66 yo male who presents with R foot pain and developing right calf pain at times after walking and noted that resting did help his pain.  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 He underwent R great toe amp and R FPBG. PMH:HTN, tobacco abuse, substance abuse  Clinical Impression  Pt admitted with above diagnosis. Eval limited by pain today. Pt lives in boarding house. Hoping he can go home with daughter initially but unsure about this. Pt able to stand EOB with min A but could not tolerate further mobility due to pain. Reviewed there ex to perform in bed. Expect that he will progress well.  Pt currently with functional limitations due to the deficits listed below (see PT Problem List). Pt will benefit from skilled PT to increase their independence and safety with mobility to allow discharge to the venue listed below.          Recommendations for follow up therapy are one component of a multi-disciplinary discharge planning process, led by the attending physician.  Recommendations may be updated based on patient status, additional functional criteria and insurance authorization.  Follow Up Recommendations Home health PT    Assistance Recommended at Discharge Intermittent Supervision/Assistance  Patient can return home with the following  A little help with walking and/or transfers;A little help with bathing/dressing/bathroom;Assistance with cooking/housework;Direct supervision/assist for medications management;Direct supervision/assist for financial management;Assist for transportation;Help with stairs or ramp for entrance    Equipment Recommendations Rolling walker (2 wheels)   Recommendations for Other Services       Functional Status Assessment Patient has had a recent decline in their functional status and demonstrates the ability to make significant improvements in function in a reasonable and predictable amount of time.     Precautions / Restrictions Precautions Precautions: Fall Restrictions Weight Bearing Restrictions: No      Mobility  Bed Mobility Overal bed mobility: Modified Independent             General bed mobility comments: pt able to come to EOB with increased time    Transfers Overall transfer level: Needs assistance Equipment used: 1 person hand held assist Transfers: Sit to/from Stand Sit to Stand: Min assist           General transfer comment: cues for more wt on LLE and only heel on floor on R. Min A to stand, increased pain in standing    Ambulation/Gait               General Gait Details: still had A-line, could not use RW and very painful so did not progress ambulation  Stairs            Wheelchair Mobility    Modified Rankin (Stroke Patients Only)       Balance Overall balance assessment: Mild deficits observed, not formally tested                                           Pertinent Vitals/Pain Pain Assessment Pain Assessment: Faces Faces Pain Scale: Hurts worst Pain Location: R foot Pain Descriptors / Indicators: Moaning, Sore Pain Intervention(s): Limited activity  within patient's tolerance, Monitored during session    Home Living Family/patient expects to be discharged to:: Other (Comment)                   Additional Comments: pt lived in a boarding house, unsure what d/c plan is at this time    Prior Function Prior Level of Function : Independent/Modified Independent;Working/employed             Mobility Comments: 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       Higher education careers adviser        Extremity/Trunk Assessment   Upper  Extremity Assessment Upper Extremity Assessment: Overall WFL for tasks assessed    Lower Extremity Assessment Lower Extremity Assessment: RLE deficits/detail RLE Deficits / Details: pain with all mvmt RLE Sensation:  (hypersensation) RLE Coordination: decreased gross motor    Cervical / Trunk Assessment Cervical / Trunk Assessment: Normal  Communication   Communication: No difficulties  Cognition Arousal/Alertness: Awake/alert Behavior During Therapy: WFL for tasks assessed/performed Overall Cognitive Status: Within Functional Limits for tasks assessed                                          General Comments      Exercises General Exercises - Lower Extremity Ankle Circles/Pumps: AROM, Both, 10 reps, Supine Quad Sets: AROM, Both, 10 reps, Supine   Assessment/Plan    PT Assessment Patient needs continued PT services  PT Problem List Pain;Decreased mobility;Decreased knowledge of use of DME;Decreased knowledge of precautions;Impaired sensation;Decreased activity tolerance;Decreased strength       PT Treatment Interventions DME instruction;Gait training;Functional mobility training;Therapeutic activities;Therapeutic exercise;Stair training;Balance training;Neuromuscular re-education;Cognitive remediation;Patient/family education    PT Goals (Current goals can be found in the Care Plan section)  Acute Rehab PT Goals Patient Stated Goal: none stated PT Goal Formulation: With patient Time For Goal Achievement: 11/07/21 Potential to Achieve Goals: Good    Frequency Min 3X/week     Co-evaluation               AM-PAC PT "6 Clicks" Mobility  Outcome Measure Help needed turning from your back to your side while in a flat bed without using bedrails?: None Help needed moving from lying on your back to sitting on the side of a flat bed without using bedrails?: None Help needed moving to and from a bed to a chair (including a wheelchair)?: A Little Help  needed standing up from a chair using your arms (e.g., wheelchair or bedside chair)?: A Little Help needed to walk in hospital room?: Total Help needed climbing 3-5 steps with a railing? : Total 6 Click Score: 16    End of Session   Activity Tolerance: Patient limited by pain Patient left: in bed;with bed alarm set;with call bell/phone within reach Nurse Communication: Mobility status PT Visit Diagnosis: Pain;Difficulty in walking, not elsewhere classified (R26.2) Pain - Right/Left: Right Pain - part of body: Leg    Time: 2197-5883 PT Time Calculation (min) (ACUTE ONLY): 18 min   Charges:   PT Evaluation $PT Eval Moderate Complexity: 1 Mod          [place], PT  Acute Rehab Services  Pager [phone] Office [phone]   Lawana Chambers Ashey Tram[place]ana 10/24/2021, 6:16 PM

## 2021-10-24 NOTE — Anesthesia Procedure Notes (Signed)
Arterial Line Insertion Performed by: Drema Pry, CRNA, CRNA  Patient location: Pre-op. Preanesthetic checklist: patient identified, IV checked, site marked, risks and benefits discussed, surgical consent, monitors and equipment checked, pre-op evaluation, timeout performed and anesthesia consent Lidocaine 1% used for infiltration Right, radial was placed Catheter size: 20 G Hand hygiene performed , maximum sterile barriers used  and Seldinger technique used Allen's test indicative of satisfactory collateral circulation Attempts: 1 Procedure performed without using ultrasound guided technique. Following insertion, dressing applied and Biopatch. Post procedure assessment: normal  Patient tolerated the procedure well with no immediate complications.

## 2021-10-24 NOTE — Progress Notes (Signed)
Patient off floor for procedure 

## 2021-10-24 NOTE — Anesthesia Procedure Notes (Signed)
Procedure Name: Intubation Date/Time: 10/24/2021 10:03 AM Performed by: Valda Favia, CRNA Pre-anesthesia Checklist: Patient identified, Emergency Drugs available, Suction available and Patient being monitored Patient Re-evaluated:Patient Re-evaluated prior to induction Oxygen Delivery Method: Circle System Utilized Preoxygenation: Pre-oxygenation with 100% oxygen Induction Type: IV induction Ventilation: Mask ventilation without difficulty Laryngoscope Size: Mac and 4 Grade View: Grade I Tube type: Oral Tube size: 7.5 mm Number of attempts: 1 Airway Equipment and Method: Stylet and Oral airway Placement Confirmation: ETT inserted through vocal cords under direct vision, positive ETCO2 and breath sounds checked- equal and bilateral Secured at: 22 cm Tube secured with: Tape Dental Injury: Teeth and Oropharynx as per pre-operative assessment

## 2021-10-25 ENCOUNTER — Encounter (HOSPITAL_COMMUNITY): Payer: Self-pay | Admitting: Vascular Surgery

## 2021-10-25 DIAGNOSIS — I70223 Atherosclerosis of native arteries of extremities with rest pain, bilateral legs: Secondary | ICD-10-CM | POA: Diagnosis not present

## 2021-10-25 LAB — CBC WITH DIFFERENTIAL/PLATELET
Abs Immature Granulocytes: 0.06 10*3/uL (ref 0.00–0.07)
Basophils Absolute: 0 10*3/uL (ref 0.0–0.1)
Basophils Relative: 0 %
Eosinophils Absolute: 0.1 10*3/uL (ref 0.0–0.5)
Eosinophils Relative: 1 %
HCT: 29.1 % — ABNORMAL LOW (ref 39.0–52.0)
Hemoglobin: 9.5 g/dL — ABNORMAL LOW (ref 13.0–17.0)
Immature Granulocytes: 1 %
Lymphocytes Relative: 14 %
Lymphs Abs: 0.9 10*3/uL (ref 0.7–4.0)
MCH: 28.9 pg (ref 26.0–34.0)
MCHC: 32.6 g/dL (ref 30.0–36.0)
MCV: 88.4 fL (ref 80.0–100.0)
Monocytes Absolute: 0.4 10*3/uL (ref 0.1–1.0)
Monocytes Relative: 6 %
Neutro Abs: 5.1 10*3/uL (ref 1.7–7.7)
Neutrophils Relative %: 78 %
Platelets: 390 10*3/uL (ref 150–400)
RBC: 3.29 MIL/uL — ABNORMAL LOW (ref 4.22–5.81)
RDW: 14 % (ref 11.5–15.5)
WBC: 6.5 10*3/uL (ref 4.0–10.5)
nRBC: 0 % (ref 0.0–0.2)

## 2021-10-25 LAB — POCT I-STAT 7, (LYTES, BLD GAS, ICA,H+H)
Acid-Base Excess: 2 mmol/L (ref 0.0–2.0)
Bicarbonate: 27.4 mmol/L (ref 20.0–28.0)
Calcium, Ion: 1.15 mmol/L (ref 1.15–1.40)
HCT: 30 % — ABNORMAL LOW (ref 39.0–52.0)
Hemoglobin: 10.2 g/dL — ABNORMAL LOW (ref 13.0–17.0)
O2 Saturation: 100 %
Patient temperature: 36.3
Potassium: 4.4 mmol/L (ref 3.5–5.1)
Sodium: 137 mmol/L (ref 135–145)
TCO2: 29 mmol/L (ref 22–32)
pCO2 arterial: 43.2 mmHg (ref 32–48)
pH, Arterial: 7.407 (ref 7.35–7.45)
pO2, Arterial: 200 mmHg — ABNORMAL HIGH (ref 83–108)

## 2021-10-25 LAB — LIPID PANEL
Cholesterol: 81 mg/dL (ref 0–200)
HDL: 30 mg/dL — ABNORMAL LOW (ref >40)
LDL Cholesterol: 36 mg/dL (ref 0–99)
Total CHOL/HDL Ratio: 2.7 RATIO
Triglycerides: 74 mg/dL (ref <150)
VLDL: 15 mg/dL (ref 0–40)

## 2021-10-25 LAB — BASIC METABOLIC PANEL
Anion gap: 7 (ref 5–15)
BUN: 19 mg/dL (ref 8–23)
CO2: 27 mmol/L (ref 22–32)
Calcium: 8.6 mg/dL — ABNORMAL LOW (ref 8.9–10.3)
Chloride: 102 mmol/L (ref 98–111)
Creatinine, Ser: 0.97 mg/dL (ref 0.61–1.24)
GFR, Estimated: >60 mL/min (ref >60)
Glucose, Bld: 104 mg/dL — ABNORMAL HIGH (ref 70–99)
Potassium: 4.4 mmol/L (ref 3.5–5.1)
Sodium: 136 mmol/L (ref 135–145)

## 2021-10-25 LAB — MAGNESIUM: Magnesium: 2.1 mg/dL (ref 1.7–2.4)

## 2021-10-25 NOTE — Progress Notes (Signed)
  Progress Note Patient: Paul Madden DOB: 1955/07/02 DOA: 10/19/2021  DOS: the patient was seen and examined on 10/25/2021  Brief hospital course: Paul Madden is a 66 y.o. male with PMH HTN, tobacco use, etoh use, substance abuse who presented with right foot pain.  He also endorsed developing right calf pain at times after walking and noted that resting did help his pain. He was admitted for further vascular work-up.  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  Vascular surgery was consulted on admission. Assessment and Plan: * Critical limb ischemia of both lower extremities (HCC) Outpatient ABIs show critical limb ischemia in the bilateral lower extremities CTA shows B/L PAD. Vascular surgery was consulted, appreciate assistance. - underwent right common femoral endarterectomy and R CFA to below-knee popliteal artery bypass with nonreversed, ipisilateral, translocated greater saphenous vein on 5/30 Plan for dressing removal tomorrow. PT recommends home with home health.  Osteomyelitis of toe of right foot ([place]) - Dry gangrene with osteomyelitis noted on x-ray - nontoxic appearing; no s/s sepsis; hold off on abx -Appreciate vascular surgery involvement.  Underwent right great toe amputation on 10/24/2021  Alcohol use - Patient drinks 4-6 beers per day -Denies ever having withdrawals - no concern for withdrawal; d/c CIWA  Substance abuse ([place]) - Last cocaine use was the day prior to admission -Counseled on the importance of cessation -TOC consult  Tobacco abuse -Counseled on the importance of cessation -Counseled on the association between cigarettes and peripheral vascular disease -1 pack/day smoker, 21 mg nicotine patch for cravings  Essential hypertension - Continue amlodipine -Use labetalol or hydralazine as  needed as well  Subjective: No nausea or vomiting.  Continues to report pain in his leg.  No fever no chills.  Pain is controlled with his medication  Physical Exam: Vitals:   10/25/21 0322 10/25/21 0724 10/25/21 1237 10/25/21 1510  BP: 135/75 120/67 (!) 146/76 111/72  Pulse: 82 85 88 81  Resp: 16 20 20 20   Temp: 98.3 F (36.8 C) 98.3 F (36.8 C) 98.4 F (36.9 C) 98.2 F (36.8 C)  TempSrc: Oral Oral Oral Oral  SpO2: 96% 99% 96% 97%  Weight:      Height:       General: Appear in mild distress; no visible Abnormal Neck Mass Or lumps, Conjunctiva normal Cardiovascular: S1 and S2 Present, no Murmur, Respiratory: good respiratory effort, Bilateral Air entry present and CTA, no Crackles, no wheezes Abdomen: Bowel Sound present, Non tender  Extremities: no Pedal edema Neurology: alert and oriented to time, place, and person  Gait not checked due to patient safety concerns   Data Reviewed: I have Reviewed nursing notes, Vitals, and Lab results since pt's last encounter. Pertinent lab results CBC and BMP    Family Communication: None at bedside  Disposition: Status is: Inpatient Remains inpatient appropriate because: Monitoring for postop recovery.  Will follow vascular surgery recommendation. pain control.  Author: Berle Mull, MD 10/25/2021 6:41 PM  Please look on [email or web address] to find out who is on call.

## 2021-10-25 NOTE — Progress Notes (Addendum)
  Progress Note    10/25/2021 7:28 AM 1 Day Post-Op  Subjective:  right leg sore   Vitals:   10/25/21 0322 10/25/21 0724  BP: 135/75 120/67  Pulse: 82 85  Resp: 16 20  Temp: 98.3 F (36.8 C) 98.3 F (36.8 C)  SpO2: 96% 99%   Physical Exam: Cardiac:  regular Lungs:  non labored Incisions: groin, right leg incisions intact and well appearing. No swelling or hematoma Extremities:  palpable pulse in right lower extremity bypass, palpable DP pulse Neurologic: alert and oriented  CBC    Component Value Date/Time   WBC 6.2 10/24/2021 0148   RBC 4.18 (L) 10/24/2021 0148   HGB 11.8 (L) 10/24/2021 0148   HGB 11.9 (L) 03/27/2021 1527   HCT 37.1 (L) 10/24/2021 0148   HCT 35.9 (L) 03/27/2021 1527   PLT 422 (H) 10/24/2021 0148   PLT 463 (H) 03/27/2021 1527   MCV 88.8 10/24/2021 0148   MCV 88 03/27/2021 1527   MCH 28.2 10/24/2021 0148   MCHC 31.8 10/24/2021 0148   RDW 14.2 10/24/2021 0148   RDW 13.2 03/27/2021 1527   LYMPHSABS 1.1 10/24/2021 0148   LYMPHSABS 1.2 03/27/2021 1527   MONOABS 0.4 10/24/2021 0148   EOSABS 0.1 10/24/2021 0148   EOSABS 0.1 03/27/2021 1527   BASOSABS 0.0 10/24/2021 0148   BASOSABS 0.0 03/27/2021 1527    BMET    Component Value Date/Time   NA 138 10/24/2021 0148   NA 144 03/27/2021 1527   K 4.7 10/24/2021 0148   CL 102 10/24/2021 0148   CO2 28 10/24/2021 0148   GLUCOSE 101 (H) 10/24/2021 0148   BUN 24 (H) 10/24/2021 0148   BUN 11 03/27/2021 1527   CREATININE 1.18 10/24/2021 0148   CALCIUM 9.0 10/24/2021 0148   GFRNONAA >60 10/24/2021 0148    INR    Component Value Date/Time   INR 1.1 10/23/2021 1629     Intake/Output Summary (Last 24 hours) at 10/25/2021 0728 Last data filed at 10/25/2021 0546 Gross per 24 hour  Intake 2750 ml  Output 1860 ml  Net 890 ml     Assessment/Plan:  66 y.o. male is s/p  1.  [place]vest right greater saphenous vein 2.  Right common femoral endarterectomy 3.  Right common femoral to below-knee  popliteal artery bypass with nonreversed, ipsilateral, translocated greater saphenous vein 4.  Right great toe amputation 1 Day Post-Op   Right lower extremity is well perfused and warm with palpable DP pulse Pain control PRN H&H stable PT recommending HH PT Continue to mobilize as tolerated Will take dressings down on right foot tomorrow  Karoline Caldwell, PA-C [facility] [phone] 10/25/2021 7:28 AM  I have independently interviewed and examined patient and agree with PA assessment and plan above.  Plan for dressing down tomorrow.  Wynter Isaacs C. Donzetta Matters, MD [facility] [place] Office: [phone] Pager: [phone]

## 2021-10-25 NOTE — Evaluation (Signed)
Occupational Therapy Evaluation Patient Details Name: Paul Madden MRN: [id number] DOB: 1955/10/08 Today's Date: 10/25/2021   History of Present Illness Pt is 66 yo male who presents with R foot pain and developing right calf pain at times after walking and noted that resting did help his pain.  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 He underwent R great toe amp and R FPBG. PMH:HTN, tobacco abuse, substance abuse   Clinical Impression   Patient presents after procedures above.  Expresses less pain to R leg, but continued discomfort to R foot, especially when placing weight through it.  Patient really has not mobilized post procedure, and defers OOB to the BR, lunch tray arrived as well, and patient wanting to eat.  Patient is moving well at bedlevel, and has no difficulty bending R leg to reach feet for lower body ADL.  He should progress well, and really should not have any deficits once his pain subsides.  OT can continue in the acute setting, but no post acute OT is anticipated.        Recommendations for follow up therapy are one component of a multi-disciplinary discharge planning process, led by the attending physician.  Recommendations may be updated based on patient status, additional functional criteria and insurance authorization.   Follow Up Recommendations  No OT follow up    Assistance Recommended at Discharge PRN  Patient can return home with the following      Functional Status Assessment  Patient has had a recent decline in their functional status and demonstrates the ability to make significant improvements in function in a reasonable and predictable amount of time.  Equipment Recommendations  None recommended by OT    Recommendations for Other Services       Precautions / Restrictions Precautions Precautions:  Fall Restrictions Weight Bearing Restrictions: No Other Position/Activity Restrictions: painful R foot      Mobility Bed Mobility Overal bed mobility: Modified Independent                  Transfers     Transfers: Sit to/from Stand Sit to Stand: Min guard                  Balance Overall balance assessment: Mild deficits observed, not formally tested                                         ADL either performed or assessed with clinical judgement   ADL       Grooming: Set up;Sitting               Lower Body Dressing: Min guard;Sit to/from Market researcher Details (indicate cue type and reason): deferred mobility to BR - lunch tray arrived.                 Vision Patient Visual Report: No change from baseline       Perception Perception Perception: Not tested   Praxis Praxis Praxis: Not tested    Pertinent Vitals/Pain Pain Assessment Pain Assessment: Faces Faces Pain Scale: Hurts little more Pain Location: R foot Pain Descriptors / Indicators: Tender Pain Intervention(s): Monitored during session     Hand Dominance Right   Extremity/Trunk Assessment  Upper Extremity Assessment Upper Extremity Assessment: Overall WFL for tasks assessed   Lower Extremity Assessment Lower Extremity Assessment: Defer to PT evaluation   Cervical / Trunk Assessment Cervical / Trunk Assessment: Normal   Communication Communication Communication: No difficulties   Cognition Arousal/Alertness: Awake/alert Behavior During Therapy: WFL for tasks assessed/performed Overall Cognitive Status: Within Functional Limits for tasks assessed                                       General Comments  offloading weight from R leg    Exercises     Shoulder Instructions      Home Living Family/patient expects to be discharged to:: Other (Comment)                                 Additional  Comments: pt lives in a boarding house      Prior Functioning/Environment Prior Level of Function : Independent/Modified Independent;Working/employed             Mobility Comments: 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 ADLs Comments: No assist for ADL or IADL at [employer]        OT Problem List: Impaired balance (sitting and/or standing);Pain      OT Treatment/Interventions: Self-care/ADL training;Therapeutic activities;Balance training    OT Goals(Current goals can be found in the care plan section) Acute Rehab OT Goals Patient Stated Goal: Head home once I know I'm good OT Goal Formulation: With patient Time For Goal Achievement: 11/08/21 ADL Goals Pt Will Perform Grooming: with modified independence;standing Pt Will Perform Lower Body Dressing: with modified independence;sit to/from stand Pt Will Transfer to Toilet: with modified independence;regular height toilet;ambulating  OT Frequency: Min 2X/week    Co-evaluation              AM-PAC OT "6 Clicks" Daily Activity     Outcome Measure Help from another person eating meals?: None Help from another person taking care of personal grooming?: None Help from another person toileting, which includes using toliet, bedpan, or urinal?: A Little Help from another person bathing (including washing, rinsing, drying)?: A Little Help from another person to put on and taking off regular upper body clothing?: None Help from another person to put on and taking off regular lower body clothing?: A Little 6 Click Score: 21   End of Session Nurse Communication: Mobility status  Activity Tolerance: Patient tolerated treatment well Patient left: in bed;with call bell/phone within reach  OT Visit Diagnosis: Unsteadiness on feet (R26.81);Pain Pain - Right/Left: Right Pain - part of body: Ankle and joints of foot                Time: 1331-1344 OT Time Calculation (min): 13 min Charges:  OT General Charges $OT Visit: 1  Visit OT Evaluation $OT Eval Moderate Complexity: 1 Mod  10/25/2021  RP, OTR/L  Acute Rehabilitation Services  Office:  [phone]   Suzanna Obey 10/25/2021, 1:50 PM

## 2021-10-25 NOTE — Plan of Care (Signed)
  Problem: Education: Goal: Knowledge of General Education information will improve Description: Including pain rating scale, medication(s)/side effects and non-pharmacologic comfort measures Outcome: Progressing   Problem: Health Behavior/Discharge Planning: Goal: Ability to manage health-related needs will improve Outcome: Progressing   Problem: Clinical Measurements: Goal: Ability to maintain clinical measurements within normal limits will improve Outcome: Progressing Goal: Will remain free from infection Outcome: Progressing Goal: Diagnostic test results will improve Outcome: Progressing Goal: Respiratory complications will improve Outcome: Progressing Goal: Cardiovascular complication will be avoided Outcome: Progressing   Problem: Elimination: Goal: Will not experience complications related to bowel motility Outcome: Progressing Goal: Will not experience complications related to urinary retention Outcome: Progressing   Problem: Coping: Goal: Level of anxiety will decrease Outcome: Progressing   Problem: Pain Managment: Goal: General experience of comfort will improve Outcome: Progressing   

## 2021-10-25 NOTE — Progress Notes (Signed)
Physical Therapy Treatment Patient Details Name: Paul Madden MRN: [id number] DOB: 09/10/1955 Today's Date: 10/25/2021   History of Present Illness Pt is 66 yo male who presents with R foot pain and developing right calf pain at times after walking and noted that resting did help his pain. 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 He underwent R great toe amp and R FPBG. PMH: HTN, tobacco abuse, substance abuse    PT Comments    Pt received in supine, agreeable to limited therapy session, pt refusing gait progression or transfer to chair due to RLE pain. Pt able to perform sit<>stand from bed<>RW and pre-gait lateral hopping toward his L side with RLE elevated/NWB with up to minA. Reviewed supine/seated exercises for LE/core strengthening and pt able to demo back with min cues for proper technique, he would benefit from handout to reinforce seated exercises next session. Pt continues to benefit from PT services to progress toward functional mobility goals.   Recommendations for follow up therapy are one component of a multi-disciplinary discharge planning process, led by the attending physician.  Recommendations may be updated based on patient status, additional functional criteria and insurance authorization.  Follow Up Recommendations  Home health PT (pending progress with OOB mobility)     Assistance Recommended at Discharge Intermittent Supervision/Assistance  Patient can return home with the following A little help with walking and/or transfers;A little help with bathing/dressing/bathroom;Assistance with cooking/housework;Direct supervision/assist for medications management;Direct supervision/assist for financial management;Assist for transportation;Help with stairs or ramp for entrance   Equipment Recommendations  Rolling walker (2  wheels) (may need to consider 3in1 if unable to progress ambulation)    Recommendations for Other Services       Precautions / Restrictions Precautions Precautions: Fall Restrictions Weight Bearing Restrictions: No Other Position/Activity Restrictions: painful R foot     Mobility  Bed Mobility Overal bed mobility: Modified Independent             General bed mobility comments: pt able to come to long sit and EOB with increased time but insists on keeping RLE on chair with pillow, pain too great with RLE in dependent position    Transfers Overall transfer level: Needs assistance Equipment used: Rolling walker (2 wheels) Transfers: Sit to/from Stand Sit to Stand: Min guard           General transfer comment: cues for attempting with RLE NWB as pt reports RLE initially too painful to attempt. Pt keeping RLE propped up on chair with pillow, able to stand to RW at bedside and therapist scooting chair laterally for him as he hops along EOB ~2-32ft using RW support to simulate SPT. Pt refusing transfer OOB to chair    Ambulation/Gait Ambulation/Gait assistance: Min assist Gait Distance (Feet): 3 Feet Assistive device: Rolling walker (2 wheels)       Pre-gait activities: pt hopping toward his L side ~49ft with RLE propped up on chair/NWB and only weight bearing on LLE. minA for safety/stability and RW management. HR WFL throughout.        Balance Overall balance assessment: Mild deficits observed, not formally tested                                          Cognition  Arousal/Alertness: Awake/alert Behavior During Therapy: WFL for tasks assessed/performed Overall Cognitive Status: Within Functional Limits for tasks assessed                                 General Comments: pt self-limiting due to pain, discussed pain scale scoring with him as 10/10 pain usually involves elevated HR/sweating/lightheadedness/tearful affect but pt insists  his pain is 10/10. Pt agreeable to some mobility with max encouragement and discussion of risks of immobility.        Exercises General Exercises - Lower Extremity Ankle Circles/Pumps: AROM, Right, 10 reps, Seated Other Exercises Other Exercises: supine LLE AROM: single leg bridges x 5 reps (cues for core activation) Other Exercises: STS x 2 reps for LLE strengthening (pt maintains RLE NWB due to pain) using RW Other Exercises: supine RLE AROM: SLR, hip abduction, heel slides x5-15 reps ea    General Comments General comments (skin integrity, edema, etc.): HR to 78 bpm with exertion, no dizziness or lightheadedness reported with transfers.      Pertinent Vitals/Pain Pain Assessment Pain Assessment: 0-10 Pain Score: 10-Worst pain ever Faces Pain Scale: Hurts even more Pain Location: R foot> calf/groin incisions Pain Descriptors / Indicators: Tender, Grimacing, Discomfort Pain Intervention(s): Limited activity within patient's tolerance, Monitored during session, Repositioned, RN gave pain meds during session (RN gave PO meds)    Home Living Family/patient expects to be discharged to:: Other (Comment)                   Additional Comments: pt lives in a boarding house    Prior Function            PT Goals (current goals can now be found in the care plan section) Acute Rehab PT Goals Patient Stated Goal: to progress mobility "more gradually" and reduce pain PT Goal Formulation: With patient Time For Goal Achievement: 11/07/21 Progress towards PT goals: Progressing toward goals    Frequency    Min 3X/week      PT Plan Current plan remains appropriate    Co-evaluation              AM-PAC PT "6 Clicks" Mobility   Outcome Measure  Help needed turning from your back to your side while in a flat bed without using bedrails?: None Help needed moving from lying on your back to sitting on the side of a flat bed without using bedrails?: None Help needed  moving to and from a bed to a chair (including a wheelchair)?: A Little Help needed standing up from a chair using your arms (e.g., wheelchair or bedside chair)?: A Little Help needed to walk in hospital room?: Total Help needed climbing 3-5 steps with a railing? : Total 6 Click Score: 16    End of Session   Activity Tolerance: Patient limited by pain Patient left: in bed;with call bell/phone within reach;with bed alarm set;Other (comment) (RLE elevated on yellow bone foam with heel floated) Nurse Communication: Mobility status;Patient requests pain meds PT Visit Diagnosis: Pain;Difficulty in walking, not elsewhere classified (R26.2) Pain - Right/Left: Right Pain - part of body: Leg;Ankle and joints of foot     Time: [id number] PT Time Calculation (min) (ACUTE ONLY): 20 min  Charges:  $Therapeutic Exercise: 8-22 mins                     Jahlia Omura P., PTA Acute Rehabilitation Services Secure Chat Preferred  9a-5:30pm Office: [phone]    Angus Palms 10/25/2021, 5:33 PM

## 2021-10-26 ENCOUNTER — Telehealth: Payer: Self-pay | Admitting: Family Medicine

## 2021-10-26 ENCOUNTER — Other Ambulatory Visit (HOSPITAL_COMMUNITY): Payer: Self-pay

## 2021-10-26 DIAGNOSIS — I70223 Atherosclerosis of native arteries of extremities with rest pain, bilateral legs: Secondary | ICD-10-CM | POA: Diagnosis not present

## 2021-10-26 LAB — CBC WITH DIFFERENTIAL/PLATELET
Abs Immature Granulocytes: 0.03 10*3/uL (ref 0.00–0.07)
Basophils Absolute: 0 10*3/uL (ref 0.0–0.1)
Basophils Relative: 0 %
Eosinophils Absolute: 0.1 10*3/uL (ref 0.0–0.5)
Eosinophils Relative: 1 %
HCT: 27.5 % — ABNORMAL LOW (ref 39.0–52.0)
Hemoglobin: 8.7 g/dL — ABNORMAL LOW (ref 13.0–17.0)
Immature Granulocytes: 0 %
Lymphocytes Relative: 13 %
Lymphs Abs: 0.9 10*3/uL (ref 0.7–4.0)
MCH: 28.2 pg (ref 26.0–34.0)
MCHC: 31.6 g/dL (ref 30.0–36.0)
MCV: 89 fL (ref 80.0–100.0)
Monocytes Absolute: 0.5 10*3/uL (ref 0.1–1.0)
Monocytes Relative: 7 %
Neutro Abs: 5.4 10*3/uL (ref 1.7–7.7)
Neutrophils Relative %: 79 %
Platelets: 394 10*3/uL (ref 150–400)
RBC: 3.09 MIL/uL — ABNORMAL LOW (ref 4.22–5.81)
RDW: 14.1 % (ref 11.5–15.5)
WBC: 6.9 10*3/uL (ref 4.0–10.5)
nRBC: 0 % (ref 0.0–0.2)

## 2021-10-26 LAB — BASIC METABOLIC PANEL
Anion gap: 7 (ref 5–15)
BUN: 19 mg/dL (ref 8–23)
CO2: 28 mmol/L (ref 22–32)
Calcium: 8.7 mg/dL — ABNORMAL LOW (ref 8.9–10.3)
Chloride: 102 mmol/L (ref 98–111)
Creatinine, Ser: 1.03 mg/dL (ref 0.61–1.24)
GFR, Estimated: >60 mL/min (ref >60)
Glucose, Bld: 105 mg/dL — ABNORMAL HIGH (ref 70–99)
Potassium: 4.8 mmol/L (ref 3.5–5.1)
Sodium: 137 mmol/L (ref 135–145)

## 2021-10-26 LAB — MAGNESIUM: Magnesium: 2.1 mg/dL (ref 1.7–2.4)

## 2021-10-26 MED ORDER — ATORVASTATIN CALCIUM 80 MG PO TABS
80.0000 mg | ORAL_TABLET | Freq: Every day | ORAL | 11 refills | Status: DC
  Filled 2021-10-26: qty 90, 90d supply, fill #0

## 2021-10-26 MED ORDER — OXYCODONE-ACETAMINOPHEN 5-325 MG PO TABS
1.0000 | ORAL_TABLET | ORAL | 0 refills | Status: DC | PRN
  Filled 2021-10-26: qty 30, 3d supply, fill #0

## 2021-10-26 MED ORDER — HYDRALAZINE HCL 25 MG PO TABS
25.0000 mg | ORAL_TABLET | ORAL | 11 refills | Status: DC | PRN
  Filled 2021-10-26: qty 180, 30d supply, fill #0

## 2021-10-26 MED ORDER — ASPIRIN 81 MG PO TBEC
81.0000 mg | DELAYED_RELEASE_TABLET | Freq: Every day | ORAL | 12 refills | Status: DC
  Filled 2021-10-26: qty 90, 90d supply, fill #0

## 2021-10-26 MED ORDER — NICOTINE 21 MG/24HR TD PT24
21.0000 mg | MEDICATED_PATCH | Freq: Every day | TRANSDERMAL | 0 refills | Status: DC
  Filled 2021-10-26: qty 28, 28d supply, fill #0

## 2021-10-26 MED ORDER — AMLODIPINE BESYLATE 10 MG PO TABS
10.0000 mg | ORAL_TABLET | Freq: Every day | ORAL | 11 refills | Status: DC
Start: 2021-10-27 — End: 2023-06-17
  Filled 2021-10-26: qty 90, 90d supply, fill #0

## 2021-10-26 MED ORDER — POLYETHYLENE GLYCOL 3350 17 GM/SCOOP PO POWD
17.0000 g | Freq: Every day | ORAL | 0 refills | Status: DC
  Filled 2021-10-26: qty 238, 14d supply, fill #0

## 2021-10-26 NOTE — Progress Notes (Signed)
Physical Therapy Treatment Patient Details Name: Paul Madden MRN: [id number] DOB: [birth year] Today's Date: 10/26/2021   History of Present Illness Pt is 66 yo male who presents with R foot pain and developing right calf pain at times after walking and noted that resting did help his pain. 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 He underwent R great toe amp and R FPBG. PMH: HTN, tobacco abuse, substance abuse    PT Comments    Pt received in supine, agreeable to therapy session after significant encouragement, with goal of transfer OOB to chair and progressing ambulation. Pt with slow progress toward mobility goals due to self-limiting behaviors and pain, but able to progress to stand pivot transfer to bedside chair modI unassisted and able to stand with Supervision at most for safety cues. Pt safer standing with RW, reports RLE pain is too great when his R foot is on floor and unwilling to attempt ambulation with RLE or hopping with RW. Pt with good UE and LLE strength and fair standing balance with AD, able to perform chair push-ups x10 and STS x7 reps from chair<>RW and no AD x1 rep. Disposition discussed with care team including case manager and MD, pt reporting concerns with mold at his current living situation. Continue to recommend HHPT, pending progress and pt participation, DME recs updated below for clarity per discussion with pt and supervising PT Jaclyn R. Pt continues to benefit from PT services to progress toward functional mobility goals.   Recommendations for follow up therapy are one component of a multi-disciplinary discharge planning process, led by the attending physician.  Recommendations may be updated based on patient status, additional functional criteria and insurance authorization.  Follow Up Recommendations  Home health  PT     Assistance Recommended at Discharge Intermittent Supervision/Assistance  Patient can return home with the following A little help with walking and/or transfers;A little help with bathing/dressing/bathroom;Assistance with cooking/housework;Direct supervision/assist for medications management;Direct supervision/assist for financial management;Assist for transportation;Help with stairs or ramp for entrance   Equipment Recommendations  Rolling walker (2 wheels);BSC/3in1 (may need to consider WC if unable to progress ambulation but pt states "I will only accept an electric wheelchair" when PTA suggested a manual WC)    Recommendations for Other Services       Precautions / Restrictions Precautions Precautions: Fall Restrictions Weight Bearing Restrictions: No     Mobility  Bed Mobility Overal bed mobility: Modified Independent             General bed mobility comments: pt able to come to long sit and EOB with increased time but insists on keeping RLE on chair with pillow, pain too great with RLE in dependent position    Transfers Overall transfer level: Needs assistance Equipment used: Rolling walker (2 wheels) Transfers: Sit to/from Stand, Bed to chair/wheelchair/BSC Sit to Stand: Supervision     Squat pivot transfers: Modified independent (Device/Increase time)     General transfer comment: modI to pivot from bed>chair without assist; Pt keeping RLE propped up on chair while standing to RW from chair x7 total reps, once with R foot on floor and x6 other reps while R foot propped up on chair. Pt needs Supervision for safety cues for hand placement but no overt LOB.    Ambulation/Gait  General Gait Details: pt refusing, reports R foot still too painful when standing with R leg down and post-op shoe on.      Balance Overall balance assessment: Needs assistance Sitting-balance support: No upper extremity supported Sitting balance-Leahy Scale:  Normal     Standing balance support: Bilateral upper extremity supported, Single extremity supported Standing balance-Leahy Scale: Fair Standing balance comment: fair static standing with LLE and RW support, also able to stand with BLE on floor and no UE support but only <5 seconds due to RLE pain                            Cognition Arousal/Alertness: Awake/alert Behavior During Therapy: WFL for tasks assessed/performed Overall Cognitive Status: Within Functional Limits for tasks assessed                                 General Comments: pt self-limiting due to pain, discussed pain scale scoring with him, pt indicates pain is minimal after premedication but groaning/grimacing with movement and states "10/10" whenever prompted for numerical scale score. Pt        Exercises Other Exercises Other Exercises: chair push-ups x10 reps Other Exercises: STS x 5 reps for LLE strengthening from chair with RW support with RLE propped up on chair, then x1 rep from chair with RLE on floor Other Exercises: supine RLE AROM: heel slides x5 reps ea    General Comments General comments (skin integrity, edema, etc.): HR 80-94 bpm with sitting/standing activity, BP 121/78 supine prior to OOB and pt denies lightheadedness; SpO2 93% on RA with noisy signal      Pertinent Vitals/Pain Pain Assessment Pain Assessment: Faces Pain Score:  (pt reports "no pain" after medication but when asked about current pain level supine or sitting, always states "10/10") Faces Pain Scale: Hurts little more Pain Location: R foot> calf/groin incisions Pain Descriptors / Indicators: Tender, Grimacing, Discomfort Pain Intervention(s): Monitored during session, Premedicated before session, Repositioned (pt refuses ice)           PT Goals (current goals can now be found in the care plan section) Acute Rehab PT Goals Patient Stated Goal: to progress mobility "more gradually" and reduce pain PT  Goal Formulation: With patient Time For Goal Achievement: 11/07/21 Progress towards PT goals: Progressing toward goals (slow progress)    Frequency    Min 3X/week      PT Plan Current plan remains appropriate       AM-PAC PT "6 Clicks" Mobility   Outcome Measure  Help needed turning from your back to your side while in a flat bed without using bedrails?: None Help needed moving from lying on your back to sitting on the side of a flat bed without using bedrails?: None Help needed moving to and from a bed to a chair (including a wheelchair)?: None Help needed standing up from a chair using your arms (e.g., wheelchair or bedside chair)?: A Little Help needed to walk in hospital room?: Total (pt refusing) Help needed climbing 3-5 steps with a railing? : Total 6 Click Score: 17    End of Session Equipment Utilized During Treatment:  (pt refusing gait belt) Activity Tolerance: Patient limited by pain Patient left: with call bell/phone within reach;Other (comment);in chair;with chair alarm set (BLE reclined with yellow bone foam underneath) Nurse Communication: Mobility status;Patient requests pain meds PT Visit Diagnosis: Pain;Difficulty in walking,  not elsewhere classified (R26.2) Pain - Right/Left: Right Pain - part of body: Leg;Ankle and joints of foot     Time: [id number] PT Time Calculation (min) (ACUTE ONLY): 29 min  Charges:  $Therapeutic Exercise: 8-22 mins $Therapeutic Activity: 8-22 mins                     Jrue Yambao P., PTA Acute Rehabilitation Services Secure Chat Preferred 9a-5:30pm Office: [phone]    Dorathy KinsmanCarly M [facility] 10/26/2021, 11:52 AM

## 2021-10-26 NOTE — Discharge Instructions (Addendum)
Daily dressing changes gauze , may weight bear on heel, heel to be elevated when sitting, not pressing on anything.        Intensive Outpatient Programs   [facility]                                  [facility] [address]                                                       [place] [address] [place],  [place], [place]                                                              [place]         (Inpatient and outpatient)                                           [phone] (Suboxone and Methadone) [address]                                                                                                                 [phone]                                                                                                     [facility]: [facility]  [facility] - Intensive Outpatient [place] [id number] [place], [place] [address]                                                 [place], [place]                                                              [id number]   [facility] (Outpatient, Inpatient, Chemical       [facility] (Groups and [facility]) (insurance only) [phone]                                              [place], [place]                                                    [facility]                                       [facility] (for caregivers and family) [address]                                                    [address]  [address], [place], [place]  [phone]   Residential Treatment Programs   [place]          [facility](2 years) Residential: 90 days)                 [facility] ([place], [place], [place]                                                              [phone] or [phone]   [facility]                                              [facility] [address]                                                               [address] [place], [place], [place]                                                              [phone]   [place]                                [facility] ([facility]) [place]                                                           [address] [place], [place] [address]  [place], [place]                                                              [phone] Admissions: 8am-3pm M-F   [facility]: Residential Program [phone] Days)                     [facility]: [place], [place], [place] or [phone]                                             (Walk in Hours over the weekend or by referral)     [facility]: [facility]:[phone] (for crisis response 24 hours a day)

## 2021-10-26 NOTE — Discharge Summary (Signed)
Physician Discharge Summary  Paul Madden [id number] DOB: Oct 07, 1955 DOA: 10/19/2021  PCP: Lindell Spar, MD  Admit date: 10/19/2021 Discharge date: 10/26/2021  Time spent: 37  minutes  Recommendations for Outpatient Follow-up:  Patient to be followed in OP by Dr. Channing Mutters and statin on d/c Needs Chem-12 CBC in 1 week We will follow-up with Dr. Gwenlyn Saran in the outpatient setting  Discharge Diagnoses:  MAIN problem for hospitalization   Gangrene  Please see below for itemized issues addressed in [place]- refer to other progress notes for clarity if needed  Discharge Condition: Improved  Diet recommendation: Diabetic heart healthy  Filed Weights   10/19/21 1436 10/19/21 2234 10/24/21 0842  Weight: 65.8 kg 54.7 kg 56.7 kg    History of present illness:  66 year old black male Known history HTN tobacco abuse disorder EtOH with substance abuse --drinks 4-6 beers per day cocaine occasionally current pack-a-day smoker came to ED 10/19/2021 with right great toe pain after toenail was removed several months ago-reported serosanguineous fluid and change of color 2 weeks prior this got acutely worse Patient was started on doxycycline 5/17 Had outpatient ABIs showing critical limb ischemia  10/24/2021 underwent harvest R greater saphenous vein + R CF endarterectomy, RCF-->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 Lamont Dowdy Course:  Osteomyelitis right foot great toe Critical limb ischemia both lower extremities Underwent above-named procedure No antibiotics on discharge-wound reviewed by vascular who recommends home health with PT Patient vacillated some according to this but finally decided to go to daughter's house Will need aspirin and statin high-dose Spoke to Dr. Gwenlyn Saran who will arrange for follow-up outpatient We will in addition get a PCP home health RN PT OT Polysubstance abuse Patient drinks several beers a day uses cocaine and he was  counseled to stop as this could hurt him-I am not sure if he will comply HTN Given his cocaine abuse it is difficult to control him on a beta-blocker-would not use an ACE given his underlying noncompliance and lack of lab follow-ups-going home on meds as per MAR amlodipine and hydralazine Malnutrition with BMI of 18  Discharge Exam: Vitals:   10/26/21 0405 10/26/21 0730  BP: 123/75 126/69  Pulse: 82 81  Resp: 17 18  Temp: 98 F (36.7 C) 97.6 F (36.4 C)  SpO2: 94% 98%    Subj on day of d/c   Awake coherent no distress no pain sitting up in bed wounds examined groin and upper thigh wounds look good  General Exam on discharge  EOMI NCAT no focal deficit CTA B no added sound rales rhonchi No lower extremity edema ROM intact Wound not examined Neuro intact coherent  Discharge Instructions    Allergies as of 10/26/2021   No Known Allergies      Medication List     STOP taking these medications    doxycycline 100 MG tablet Commonly known as: VIBRA-TABS   ibuprofen 200 MG tablet Commonly known as: ADVIL       TAKE these medications    acetaminophen 650 MG CR tablet Commonly known as: Tylenol 8 Hour Take 1 tablet (650 mg total) by mouth every 8 (eight) hours as needed for pain.   amLODipine 10 MG tablet Commonly known as: NORVASC Take 1 tablet (10 mg total) by mouth daily. Start taking on: October 27, 2021 What changed:  medication strength how much to take   aspirin EC 81 MG tablet Take 1 tablet (81 mg total) by mouth daily.  Swallow whole. Start taking on: October 27, 2021   atorvastatin 80 MG tablet Commonly known as: LIPITOR Take 1 tablet (80 mg total) by mouth at bedtime.   hydrALAZINE 25 MG tablet Commonly known as: APRESOLINE Take 1 tablet (25 mg total) by mouth every 4 (four) hours as needed (use 2nd if unable to use labetalol. For SBP>160 OR DBP>100).   MULTIPLE VITAMINS PO Take by mouth.   nicotine 21 mg/24hr patch Commonly known as: NICODERM CQ  - dosed in mg/24 hours Place 1 patch (21 mg total) onto the skin daily. Start taking on: October 27, 2021   oxyCODONE-acetaminophen 5-325 MG tablet Commonly known as: PERCOCET/ROXICET Take 1-2 tablets by mouth every 4 (four) hours as needed for moderate pain.   polyethylene glycol 17 g packet Commonly known as: MIRALAX / GLYCOLAX Take 17 g by mouth daily. Start taking on: October 27, 2021               Durable Medical Equipment  (From admission, onward)           Start     Ordered   10/26/21 1147  For home use only DME Walker rolling  Once       Question Answer Comment  Walker: With 5 Inch Wheels   Patient needs a walker to treat with the following condition Weakness      10/26/21 1147   10/26/21 1147  For home use only DME 3 n 1  Once        10/26/21 1147              Discharge Care Instructions  (From admission, onward)           Start     Ordered   10/26/21 0000  Discharge wound care:       Comments: Per vvs   10/26/21 1450           No Known Allergies    The results of significant diagnostics from this hospitalization (including imaging, microbiology, ancillary and laboratory) are listed below for reference.    Significant Diagnostic Studies: CT ANGIO AO+BIFEM W & OR WO CONTRAST  Result Date: 10/20/2021 CLINICAL DATA:  Right great toe claudication/pain EXAM: CT ANGIOGRAPHY OF ABDOMINAL AORTA WITH ILIOFEMORAL RUNOFF TECHNIQUE: Multidetector CT imaging of the abdomen, pelvis and lower extremities was performed using the standard protocol during bolus administration of intravenous contrast. Multiplanar CT image reconstructions and MIPs were obtained to evaluate the vascular anatomy. RADIATION DOSE REDUCTION: This exam was performed according to the departmental dose-optimization program which includes automated exposure control, adjustment of the mA and/or kV according to patient size and/or use of iterative reconstruction technique. CONTRAST:  133mL  OMNIPAQUE IOHEXOL 350 MG/ML SOLN COMPARISON:  None Available. FINDINGS: VASCULAR Aorta: Scattered calcified atherosclerotic plaque. No aneurysm or dissection. Celiac: Mild stenosis secondary to fibrofatty atherosclerotic plaque at origin of the celiac artery. No evidence of dissection or aneurysm. SMA: Patent without evidence of aneurysm, dissection, vasculitis or significant stenosis. Calcified plaque at the origin without evidence of stenosis. Renals: 2 right-sided renal arteries. Significant stenosis at the origin of the more inferior artery. Left pelvic kidney. Two left-sided renal arteries 1 arising from the distal aorta and the second arising from the proximal right common iliac artery. No changes of fibromuscular dysplasia. IMA: Patent without evidence of aneurysm, dissection, vasculitis or significant stenosis. RIGHT Lower Extremity Inflow: Calcified atherosclerotic plaque throughout the common and external iliac arteries without significant stenosis, dissection, aneurysm or occlusion. 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 Outflow: Bulky calcified plaque results in at least 50% luminal narrowing of the common femoral artery. Focal plaque and stenosis at the origin of the profunda femoral arteries which remain patent. Chronic total occlusion of the superficial femoral artery beginning just beyond the origin. The artery remains occluded through the abductor canal and into the popliteal artery. The P1 segment of the popliteal artery is occluded. Bulky calcified atherosclerotic plaque present in the P2 segment of the popliteal artery. There appears to be reconstitution at the inferior aspect of the patella as the artery transitions into the P3 segment. Runoff: Calcified plaque likely resulting in some degree of stenosis at the origin of the anterior tibial artery and in the tibioperoneal trunk. However, there appears to be three-vessel runoff to the ankle. LEFT Lower Extremity Inflow: Calcified  atherosclerotic plaque throughout the iliac arteries. No significant stenosis, or dissection in the common or internal iliac arteries. The left internal iliac artery is relatively hypertrophic due to chronic total occlusion of the left external iliac artery. The occlusion extends below the inguinal ligament into the common femoral artery. Outflow: The common femoral artery is occluded in the proximal and mid segment but then reconstitutes via collateral flow from the profunda femoral artery. There does appear to be a high-grade stenosis at the origin of the profunda femoral artery. The superficial femoral artery is patent. Bulky calcified atherosclerotic plaque results in mild stenosis in the region of the abductor canal. Popliteal artery is widely patent. Runoff: Patent 3 vessel runoff to the foot. Veins: No focal venous abnormality. Review of the MIP images confirms the above findings. NON-VASCULAR Lower chest: No acute abnormality. Hepatobiliary: No focal liver abnormality is seen. No gallstones, gallbladder wall thickening, or biliary dilatation. Pancreas: Unremarkable. No pancreatic ductal dilatation or surrounding inflammatory changes. Spleen: No splenic injury or perisplenic hematoma. Adrenals/Urinary Tract: No adrenal mass. Normally positioned right kidney. No hydronephrosis, nephrolithiasis or enhancing renal mass. Tiny 7 mm low-attenuation lesion at the lower pole is too small to characterize but almost certainly a benign cyst. Mild renal cortical scarring in the periphery of the interpolar aspect of the kidney. Left pelvic kidney with mild malrotation. No hydronephrosis, nephrolithiasis or enhancing renal mass. Stomach/Bowel: Stomach is within normal limits. Appendix appears normal. No evidence of bowel wall thickening, distention, or inflammatory changes. Lymphatic: No suspicious lymphadenopathy. Reproductive: Prostate is unremarkable. Other: Moderately large right inguinal hernia containing multiple  loops of small bowel. No evidence of obstruction. Musculoskeletal: No acute or significant osseous findings. IMPRESSION: VASCULAR 1. Severe bilateral peripheral arterial disease as detailed above. 2. 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 3. On the left, there is chronic total occlusion of the external iliac artery and the proximal and mid aspects of the common femoral artery collateralized by a hypertrophic internal iliac artery and obturator artery to the profunda femoral artery. 4. Code dominant right-sided renal arteries. There is a high-grade stenosis at the origin the more inferior right renal artery. 5. 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 6. 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 7.  Aortic Atherosclerosis (ICD10-I70.0). NON-VASCULAR 1. No acute abnormality within the abdomen or pelvis. 2. Left pelvic kidney. 3. Moderately large right inguinal hernia containing multiple loops of small bowel without evidence  of obstruction or inflammation. 4. Additional ancillary findings as above. Signed, Criselda Peaches, MD, [facility] Electronically Signed   By: Jacqulynn Cadet M.D.   On: 10/20/2021 10:00   DG Foot Complete Right  Result Date: 10/19/2021 CLINICAL DATA:  Necrotic toe, possible infection in toe, pain RIGHT foot EXAM: RIGHT FOOT COMPLETE - 3+ VIEW COMPARISON:  None FINDINGS: Soft tissue loss tip of distal phalanx great toe. Osseous demineralization. Joint spaces preserved. Loss of cortical margination at the tuft of distal phalanx great toe suspicious for osteomyelitis. No acute fracture, dislocation, or additional bone destruction. IMPRESSION: Soft tissue loss at distal phalanx great toe with loss of  cortical margination at the tuft of the distal phalanx, suspicious for osteomyelitis. Electronically Signed   By: Lavonia Dana M.D.   On: 10/19/2021 15:05   VAS [place] ABI WITH/WO TBI  Result Date: 10/19/2021  LOWER EXTREMITY DOPPLER STUDY Patient Name:  WYNTON ELSTAD  Date of Exam:   10/19/2021 Medical Rec #: [id number]    Accession #:    [id number] Date of Birth: [birth year]    Patient Gender: M Patient Age:   34 years Exam Location:  [facility] Procedure:      VAS [place] ABI WITH/WO TBI Referring Phys: [employer] --------------------------------------------------------------------------------  Indications: Rest pain, gangrene, and peripheral artery disease. High Risk Factors: Current smoker. Other Factors: Non healing great toe wound status post ingrown nail removal                approx 8 months agol. Rest pain for 4 days.  Performing Technologist: Ralene Cork RVT  Examination Guidelines: A complete evaluation includes at minimum, Doppler waveform signals and systolic blood pressure reading at the level of bilateral brachial, anterior tibial, and posterior tibial arteries, when vessel segments are accessible. Bilateral testing is considered an integral part of a complete examination. Photoelectric Plethysmograph (PPG) waveforms and toe systolic pressure readings are included as required and additional duplex testing as needed. Limited examinations for reoccurring indications may be performed as noted.  ABI Findings: +---------+------------------+-----+--------+--------+ Right    Rt Pressure (mmHg)IndexWaveformComment  +---------+------------------+-----+--------+--------+ Brachial 171                                     +---------+------------------+-----+--------+--------+ PTA      0                 0.00 absent           +---------+------------------+-----+--------+--------+ DP       0                 0.00 absent            +---------+------------------+-----+--------+--------+ Great Toe                               necrotic +---------+------------------+-----+--------+--------+ +---------+------------------+-----+-------------------+-------+ Left     Lt Pressure (mmHg)IndexWaveform           Comment +---------+------------------+-----+-------------------+-------+ Brachial 162                                               +---------+------------------+-----+-------------------+-------+ PTA      112  0.65 dampened monophasic        +---------+------------------+-----+-------------------+-------+ DP       106               0.62 dampened monophasic        +---------+------------------+-----+-------------------+-------+ Great Toe9                 0.05                            +---------+------------------+-----+-------------------+-------+ +-------+-----------+-----------+------------+------------+ ABI/TBIToday's ABIToday's TBIPrevious ABIPrevious TBI +-------+-----------+-----------+------------+------------+ Right  0                                              +-------+-----------+-----------+------------+------------+ Left   0.65       0.05                                +-------+-----------+-----------+------------+------------+  No previous ABI.  Findings reported to Amy, triage RN at 11:20 am. Summary: Right: Resting right ankle-brachial index indicates critical limb ischemia. The right toe-brachial index is abnormal. Left: Resting left ankle-brachial index indicates moderate left lower extremity arterial disease. The left toe-brachial index is abnormal. *See table(s) above for measurements and observations.  Electronically signed by Orlie Pollen on 10/19/2021 at 5:07:25 PM.    Final    VAS [place] LOWER EXTREMITY SAPHENOUS VEIN MAPPING  Result Date: 10/20/2021 [place] Patient Name:  DARRNELL KIPPS  Date of Exam:   10/20/2021 Medical Rec #:  [id number]    Accession #:    [id number] Date of Birth: [birth year]    Patient Gender: M Patient Age:   65 years Exam Location:  [facility] Procedure:      VAS [place] LOWER EXTREMITY SAPHENOUS VEIN MAPPING Referring Phys: Jamelle Haring --------------------------------------------------------------------------------  Indications: Pre-op  Comparison Study: No prior study Performing Technologist: Maudry Mayhew MHA, RDMS, RVT, RDCS  Examination Guidelines: A complete evaluation includes B-mode imaging, spectral Doppler, color Doppler, and power Doppler as needed of all accessible portions of each vessel. Bilateral testing is considered an integral part of a complete examination. Limited examinations for reoccurring indications may be performed as noted. +--------------+-----------+----------------------+--------------+-------------+  RT Diameter  RT Findings         GSV           LT Diameter   LT Findings       (cm)                                           (cm)                   +--------------+-----------+----------------------+--------------+-------------+      0.50                    Saphenofemoral         0.49                                                   Junction                                   +--------------+-----------+----------------------+--------------+-------------+  0.34      branching     Proximal thigh         0.30                   +--------------+-----------+----------------------+--------------+-------------+      0.38                      Mid thigh            0.27                   +--------------+-----------+----------------------+--------------+-------------+      0.37      branching      Distal thigh          0.25                   +--------------+-----------+----------------------+--------------+-------------+      0.30                         Knee              0.17                    +--------------+-----------+----------------------+--------------+-------------+      0.31                      Prox calf            0.25                   +--------------+-----------+----------------------+--------------+-------------+      0.30      branching        Mid calf            0.11                   +--------------+-----------+----------------------+--------------+-------------+      0.40                     Distal calf           0.18                   +--------------+-----------+----------------------+--------------+-------------+      0.35                        Ankle                            not                                                                    visualized   +--------------+-----------+----------------------+--------------+-------------+ +----------------+--------------+---------------+----------------+-----------+ RT diameter (cm) RT Findings        SSV      LT Diameter (cm)LT Findings +----------------+--------------+---------------+----------------+-----------+                 not visualizedPopliteal fossa      0.30                  +----------------+--------------+---------------+----------------+-----------+                 not visualized Proximal calf  0.27                  +----------------+--------------+---------------+----------------+-----------+                 not visualized   Mid calf          0.22                  +----------------+--------------+---------------+----------------+-----------+                 not visualized  Distal calf        0.17                  +----------------+--------------+---------------+----------------+-----------+ Diagnosing physician: Servando Snare MD Electronically signed by Servando Snare MD on 10/20/2021 at 6:59:54 PM.    Final     Microbiology: Recent Results (from the past 240 hour(s))  Surgical PCR screen     Status: None   Collection Time: 10/23/21 10:26 PM   Specimen:  Nasal Mucosa; Nasal Swab  Result Value Ref Range Status   MRSA, PCR NEGATIVE NEGATIVE Final   Staphylococcus aureus NEGATIVE NEGATIVE Final    Comment: (NOTE) The Xpert SA Assay (FDA approved for NASAL specimens in patients 76 years of age and older), is one component of a comprehensive surveillance program. It is not intended to diagnose infection nor to guide or monitor treatment. Performed at [facility], [place] [address] [place], [place] [address]      Labs: Basic Metabolic Panel: Recent Labs  Lab 10/20/21 0132 10/21/21 0121 10/23/21 1629 10/24/21 0148 10/24/21 1303 10/25/21 0923 10/26/21 0313  NA 136 133* 139 138 137 136 137  K 4.8 4.5 4.7 4.7 4.4 4.4 4.8  CL 106 101 105 102  --  102 102  CO2 21* 26 29 28   --  27 28  GLUCOSE 124* 93 118* 101*  --  104* 105*  BUN 20 24* 26* 24*  --  19 19  CREATININE 1.09 1.07 1.24 1.18  --  0.97 1.03  CALCIUM 8.9 9.1 9.0 9.0  --  8.6* 8.7*  MG 1.9 2.0  --  2.3  --  2.1 2.1   Liver Function Tests: Recent Labs  Lab 10/19/21 1608 10/20/21 0132 10/23/21 1629  AST 26 26 28   ALT 18 14 17   ALKPHOS 91 81 74  BILITOT 0.3 0.4 0.2*  PROT 7.5 6.6 6.9  ALBUMIN 3.8 3.1* 3.2*   No results for input(s): LIPASE, AMYLASE in the last 168 hours. No results for input(s): AMMONIA in the last 168 hours. CBC: Recent Labs  Lab 10/20/21 0132 10/21/21 0121 10/23/21 1629 10/24/21 0148 10/24/21 1303 10/25/21 0923 10/26/21 0313  WBC 6.8 7.2 6.2 6.2  --  6.5 6.9  NEUTROABS 4.7 4.9  --  4.6  --  5.1 5.4  HGB 12.0* 12.3* 12.3* 11.8* 10.2* 9.5* 8.7*  HCT 37.1* 38.8* 38.3* 37.1* 30.0* 29.1* 27.5*  MCV 87.9 87.8 87.8 88.8  --  88.4 89.0  PLT 452* 457* 421* 422*  --  390 394   Cardiac Enzymes: No results for input(s): CKTOTAL, CKMB, CKMBINDEX, TROPONINI in the last 168 hours. BNP: BNP (last 3 results) No results for input(s): BNP in the last 8760 hours.  ProBNP (last 3 results) No results for input(s): PROBNP in the last 8760  hours.  CBG: No results for input(s): GLUCAP in the last 168 hours.     Signed:  Nita Sells MD   [facility] 10/26/2021, 2:43 PM

## 2021-10-26 NOTE — TOC Transition Note (Addendum)
Transition of Care [facility]) - CM/SW Discharge Note   Patient Details  Name: Marguis Mathieson MRN: [id number] Date of Birth: 16-Apr-1956  Transition of Care [facility]) CM/SW Contact:  Lockie Pares, RN Phone Number: 10/26/2021, 3:07 PM   Clinical Narrative:     Patient will go home with daughter, PT OT RN ordered, wound care orders in system for DC. Equipment delivered and in room.Appointment made for follow up with PCP.   Discussed with RN.  Patient ready for DC  1545 Back to room to speak to daughter, obtain her address for [facility]. Her address is [address].[place], [place]. Discussed DME. Her and father stated that walker is not going to be good, he will need crutches and a wheelchair. Discussed about making sure he was ambulating, not using the wheelchair to avoid getting up . Daughter assured me that they were interested in [facility] and that she would make sure he got up, however it would be difficult for him to walk longer halls for follow up appointments. She will take him to all appointments needed. Cruz Condon [phone] with Home Health Patient also doesn't have clothes want to know if he could go home with gowns. Ssoke to the nurse for paper scrubs.   Ortho tech called for crutches, [employer] called for wheelchair and to pick up walker.   Final next level of care: Home w Home Health Services Barriers to Discharge: No Barriers Identified   Patient Goals and CMS Choice Patient states their goals for this hospitalization and ongoing recovery are:: to daughters home or somewhere better than where he currently stays.      Discharge Placement             Home with Home Health          Discharge Plan and Services   Discharge Planning Services: CM Consult Post Acute Care Choice: Durable Medical Equipment          DME Arranged: Dan Humphreys, 3-N-1   Date DME Agency Contacted: 10/26/21 Time DME Agency Contacted: 1200 Representative spoke with at DME Agency: Mick Sell HH Arranged: PT,  OT, RN HH Agency: [facility] Date [facility] Agency Contacted: 10/26/21 Time HH Agency Contacted: 1507 Representative spoke with at [facility] Agency: Vangie Bicker  Social Determinants of Health (SDOH) Interventions     Readmission Risk Interventions     View : No data to display.

## 2021-10-26 NOTE — Care Management (Signed)
    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  A crutch will not resolve issue with performing activities of daily living. A wheelchair will allow patient to safely perform daily activities. Patient can safely propel the wheelchair in the home or has a caregiver who can provide assistance. Length of need 6 months . Accessories: elevating leg rests (ELRs), wheel locks, extensions and anti-tippers.   10/26/21 1600   10/26/21 1559  For home use only DME Crutches  Once        10/26/21 1600   10/26/21 1147  For home use only DME 3 n 1  Once        10/26/21 1147

## 2021-10-26 NOTE — Care Management (Signed)
SA resource on discharge instructions. CSW to speak to patient regarding resources.

## 2021-10-26 NOTE — Progress Notes (Signed)
Orthopedic Tech Progress Note Patient Details:  Paul Madden [birth year] [id number]  Ortho Devices Type of Ortho Device: Crutches Ortho Device/Splint Interventions: Ordered, Adjustment, Application   Post Interventions Patient Tolerated: Well Instructions Provided: Adjustment of device  [name] 10/26/2021, 4:43 PM

## 2021-10-26 NOTE — TOC Progression Note (Addendum)
Transition of Care [facility]) - Progression Note    Patient Details  Name: Paul Madden MRN: [id number] Date of Birth: 07-Jan-1956  [facility]) CM/SW Contact  Lockie Pares, RN Phone Number: 10/26/2021, 12:01 PM  Clinical Narrative:     PT evaluation suggested home health and DME Patient wishes for a Electric wheelchair and walker . Discussed with PT OT. Patient has expressed his desire to discharge to a hotel, as he feels that his current residence is not conducive to recovery, it has mold.  If he DC to hotel, home health would not be an option, as they will not go to [employer] , however sometimes they make exceptions,  will need to find out where he is planning to go.  AME walker with 5 inch wheels and 3:1 ordered for patient.  Spoke with patient, he appreciates the walker and 3:1 he was speaking to his daughter and thinks he will stay with her. , he will speak to her shortly about the details.    I will get daughters address for home health.     Expected Discharge Plan:  (Hotel/Motel) Barriers to Discharge: Transportation  Expected Discharge Plan and Services Expected Discharge Plan:  (Hotel/Motel)   Discharge Planning Services: CM Consult Post Acute Care Choice: Durable Medical Equipment                   DME Arranged: Dan Humphreys, 3-N-1   Date DME Agency Contacted: 10/26/21 Time DME Agency Contacted: 1200 Representative spoke with at DME Agency: Mick Sell HH Arranged:  (Patient wants to go to a motel, he feels there is mold at home. HH will not normally go to [employer].)           Social Determinants of Health (SDOH) Interventions    Readmission Risk Interventions     View : No data to display.

## 2021-10-26 NOTE — Plan of Care (Signed)
?  Problem: Education: ?Goal: Knowledge of General Education information will improve ?Description: Including pain rating scale, medication(s)/side effects and non-pharmacologic comfort measures ?Outcome: Adequate for Discharge ?  ?Problem: Health Behavior/Discharge Planning: ?Goal: Ability to manage health-related needs will improve ?Outcome: Adequate for Discharge ?  ?Problem: Clinical Measurements: ?Goal: Ability to maintain clinical measurements within normal limits will improve ?Outcome: Adequate for Discharge ?Goal: Will remain free from infection ?Outcome: Adequate for Discharge ?Goal: Diagnostic test results will improve ?Outcome: Adequate for Discharge ?Goal: Respiratory complications will improve ?Outcome: Adequate for Discharge ?Goal: Cardiovascular complication will be avoided ?Outcome: Adequate for Discharge ?  ?Problem: Coping: ?Goal: Level of anxiety will decrease ?Outcome: Adequate for Discharge ?  ?Problem: Elimination: ?Goal: Will not experience complications related to bowel motility ?Outcome: Adequate for Discharge ?Goal: Will not experience complications related to urinary retention ?Outcome: Adequate for Discharge ?  ?Problem: Skin Integrity: ?Goal: Risk for impaired skin integrity will decrease ?Outcome: Adequate for Discharge ?  ?Problem: Safety: ?Goal: Ability to remain free from injury will improve ?Outcome: Adequate for Discharge ?  ?Problem: Pain Managment: ?Goal: General experience of comfort will improve ?Outcome: Adequate for Discharge ?  ?

## 2021-10-26 NOTE — Progress Notes (Signed)
Mobility Specialist Progress Note    10/26/21 1606  Mobility  Activity Refused mobility   Pt initially agreeable for session after pain meds but now refusing.   [place] Mobility Specialist  Primary: 5N M.S. Phone: [phone] Secondary: 6N M.S. Phone: [phone]

## 2021-10-26 NOTE — Progress Notes (Signed)
Patient was found with a empty mini bottle of liquor in his hand this morning. He states his daughter gave it to him yesterday during her visit. Patient was asked not acceptable to do while in hospital.

## 2021-10-26 NOTE — Progress Notes (Addendum)
Mobility Specialist Progress Note    10/26/21 1517  Mobility  Activity Refused mobility   Pt states he cannot walk because the blood flowing down is too painful and he is not a bionic man that can just get up and go. States he wants something that will keep his leg fully elevated to avoid his leg pointing down at all.   [place] Mobility Specialist  Primary: 5N M.S. Phone: [phone] Secondary: 6N M.S. Phone: [phone]

## 2021-10-26 NOTE — Progress Notes (Addendum)
  Progress Note    10/26/2021 7:47 AM 2 Days Post-Op  Subjective:  no major complaints, right toe sore   Vitals:   10/26/21 0405 10/26/21 0730  BP: 123/75 126/69  Pulse: 82 81  Resp: 17 18  Temp: 98 F (36.7 C) 97.6 F (36.4 C)  SpO2: 94% 98%   Physical Exam: Cardiac:  regular Lungs:  non labored Incisions:  Right groin incision, right leg incisions clean, dry and intact. No swelling or hematoma  Right great toe amputation site well appearing, cleaned with betadine and dry dressings reapplied Extremities:  well perfused and warm with palpable DP pulse on the right Abdomen:  soft, flat Neurologic: alert and oriented  CBC    Component Value Date/Time   WBC 6.9 10/26/2021 0313   RBC 3.09 (L) 10/26/2021 0313   HGB 8.7 (L) 10/26/2021 0313   HGB 11.9 (L) 03/27/2021 1527   HCT 27.5 (L) 10/26/2021 0313   HCT 35.9 (L) 03/27/2021 1527   PLT 394 10/26/2021 0313   PLT 463 (H) 03/27/2021 1527   MCV 89.0 10/26/2021 0313   MCV 88 03/27/2021 1527   MCH 28.2 10/26/2021 0313   MCHC 31.6 10/26/2021 0313   RDW 14.1 10/26/2021 0313   RDW 13.2 03/27/2021 1527   LYMPHSABS 0.9 10/26/2021 0313   LYMPHSABS 1.2 03/27/2021 1527   MONOABS 0.5 10/26/2021 0313   EOSABS 0.1 10/26/2021 0313   EOSABS 0.1 03/27/2021 1527   BASOSABS 0.0 10/26/2021 0313   BASOSABS 0.0 03/27/2021 1527    BMET    Component Value Date/Time   NA 137 10/26/2021 0313   NA 144 03/27/2021 1527   K 4.8 10/26/2021 0313   CL 102 10/26/2021 0313   CO2 28 10/26/2021 0313   GLUCOSE 105 (H) 10/26/2021 0313   BUN 19 10/26/2021 0313   BUN 11 03/27/2021 1527   CREATININE 1.03 10/26/2021 0313   CALCIUM 8.7 (L) 10/26/2021 0313   GFRNONAA >60 10/26/2021 0313    INR    Component Value Date/Time   INR 1.1 10/23/2021 1629     Intake/Output Summary (Last 24 hours) at 10/26/2021 0747 Last data filed at 10/26/2021 0731 Gross per 24 hour  Intake 780 ml  Output 1000 ml  Net -220 ml     Assessment/Plan:  66 y.o. male  is s/p RLE CFE and Fem BK pop vein bypass, right great toe amp 2 Days Post-Op   RLE well perfused and warm with palpable DP pulse Right leg incisions are intact and well appearing. Right GT incision looks good with viable flaps Hgb trending down 8.7 but stable. No evidence of bleeding Pain control prn Continue therapies. PT recommending HH PT Continue Asa and statin  DVT prophylaxis:  sq heparin    Graceann Congress, PA-C [facility] [phone] 10/26/2021 7:47 AM  I have independently interviewed and examined patient and agree with PA assessment and plan above.  Continue mobilization.  Right toe amputation site healing well palpable pulse.  Left side he is asymptomatic for now and will continue to monitor.  Gaelen Brager C. Randie Heinz, MD [facility] [place] Office: [phone] Pager: [phone]

## 2021-10-26 NOTE — Telephone Encounter (Signed)
Hosp called to scheduled a TOC. He is being discharged today. He is scheduled to see Malachi Bonds on Monday. Can you please do TOC call tomorrow?

## 2021-10-26 NOTE — Progress Notes (Signed)
Patient D/C home with daughter, home DME equipment and medication from [facility].

## 2021-10-26 NOTE — Care Management Important Message (Signed)
Important Message  Patient Details  Name: Paul Madden MRN: [id number] Date of Birth: 07-04-1955   Medicare Important Message Given:  Yes     [name] 10/26/2021, 9:03 AM

## 2021-10-27 ENCOUNTER — Telehealth: Payer: Self-pay

## 2021-10-27 NOTE — Telephone Encounter (Signed)
1st attempt made for [facility] call

## 2021-10-27 NOTE — Telephone Encounter (Signed)
Transition Care Management Unsuccessful Follow-up Telephone Call  Date of discharge and from where:  10/26/21  Attempts:  1st Attempt  Reason for unsuccessful TCM follow-up call:  Unable to leave message

## 2021-10-30 ENCOUNTER — Ambulatory Visit: Payer: Medicare Other | Admitting: Family Medicine

## 2021-11-01 ENCOUNTER — Ambulatory Visit (INDEPENDENT_AMBULATORY_CARE_PROVIDER_SITE_OTHER): Payer: Medicare Other | Admitting: Internal Medicine

## 2021-11-01 ENCOUNTER — Encounter: Payer: Self-pay | Admitting: Internal Medicine

## 2021-11-01 VITALS — BP 118/82 | HR 104 | Resp 18 | Ht 69.0 in | Wt 131.6 lb

## 2021-11-01 DIAGNOSIS — Z09 Encounter for follow-up examination after completed treatment for conditions other than malignant neoplasm: Secondary | ICD-10-CM

## 2021-11-01 DIAGNOSIS — Z720 Tobacco use: Secondary | ICD-10-CM

## 2021-11-01 DIAGNOSIS — F1910 Other psychoactive substance abuse, uncomplicated: Secondary | ICD-10-CM

## 2021-11-01 DIAGNOSIS — I10 Essential (primary) hypertension: Secondary | ICD-10-CM | POA: Diagnosis not present

## 2021-11-01 DIAGNOSIS — I70223 Atherosclerosis of native arteries of extremities with rest pain, bilateral legs: Secondary | ICD-10-CM | POA: Diagnosis not present

## 2021-11-01 MED ORDER — OXYCODONE-ACETAMINOPHEN 5-325 MG PO TABS: 1.0000 | ORAL_TABLET | Freq: Three times a day (TID) | ORAL | 0 refills | Status: DC | PRN

## 2021-11-01 NOTE — Patient Instructions (Signed)
Please continue taking medications as prescribed.  Please take ferrous sulphate 325 mg once daily.

## 2021-11-01 NOTE — Assessment & Plan Note (Signed)
BP: 118/82   Well-controlled with Amlodipine and Hydralazine Counseled for compliance with the medications Advised DASH diet and moderate exercise/walking, at least 150 mins/week

## 2021-11-02 ENCOUNTER — Other Ambulatory Visit: Payer: Self-pay | Admitting: Internal Medicine

## 2021-11-02 DIAGNOSIS — Z7982 Long term (current) use of aspirin: Secondary | ICD-10-CM | POA: Diagnosis not present

## 2021-11-02 DIAGNOSIS — I10 Essential (primary) hypertension: Secondary | ICD-10-CM | POA: Diagnosis not present

## 2021-11-02 DIAGNOSIS — M545 Low back pain, unspecified: Secondary | ICD-10-CM | POA: Diagnosis not present

## 2021-11-02 DIAGNOSIS — I700 Atherosclerosis of aorta: Secondary | ICD-10-CM | POA: Diagnosis not present

## 2021-11-02 DIAGNOSIS — Z09 Encounter for follow-up examination after completed treatment for conditions other than malignant neoplasm: Secondary | ICD-10-CM | POA: Insufficient documentation

## 2021-11-02 DIAGNOSIS — E46 Unspecified protein-calorie malnutrition: Secondary | ICD-10-CM | POA: Diagnosis not present

## 2021-11-02 DIAGNOSIS — G8929 Other chronic pain: Secondary | ICD-10-CM | POA: Diagnosis not present

## 2021-11-02 DIAGNOSIS — F17210 Nicotine dependence, cigarettes, uncomplicated: Secondary | ICD-10-CM | POA: Diagnosis not present

## 2021-11-02 DIAGNOSIS — Z9181 History of falling: Secondary | ICD-10-CM | POA: Diagnosis not present

## 2021-11-02 DIAGNOSIS — Z4781 Encounter for orthopedic aftercare following surgical amputation: Secondary | ICD-10-CM | POA: Diagnosis not present

## 2021-11-02 DIAGNOSIS — Z89411 Acquired absence of right great toe: Secondary | ICD-10-CM | POA: Diagnosis not present

## 2021-11-02 DIAGNOSIS — D649 Anemia, unspecified: Secondary | ICD-10-CM

## 2021-11-02 LAB — CBC
Hematocrit: 28.4 % — ABNORMAL LOW (ref 37.5–51.0)
Hemoglobin: 9.1 g/dL — ABNORMAL LOW (ref 13.0–17.7)
MCH: 28.3 pg (ref 26.6–33.0)
MCHC: 32 g/dL (ref 31.5–35.7)
MCV: 89 fL (ref 79–97)
Platelets: 790 10*3/uL — ABNORMAL HIGH (ref 150–450)
RBC: 3.21 x10E6/uL — ABNORMAL LOW (ref 4.14–5.80)
RDW: 13.8 % (ref 11.6–15.4)
WBC: 9.5 10*3/uL (ref 3.4–10.8)

## 2021-11-02 LAB — BASIC METABOLIC PANEL
BUN/Creatinine Ratio: 20 (ref 10–24)
BUN: 19 mg/dL (ref 8–27)
CO2: 19 mmol/L — ABNORMAL LOW (ref 20–29)
Calcium: 9.5 mg/dL (ref 8.6–10.2)
Chloride: 106 mmol/L (ref 96–106)
Creatinine, Ser: 0.97 mg/dL (ref 0.76–1.27)
Glucose: 93 mg/dL (ref 70–99)
Potassium: 4.8 mmol/L (ref 3.5–5.2)
Sodium: 145 mmol/L — ABNORMAL HIGH (ref 134–144)
eGFR: 87 mL/min/{1.73_m2} (ref >59)

## 2021-11-02 MED ORDER — IRON (FERROUS SULFATE) 325 (65 FE) MG PO TABS: 325.0000 mg | ORAL_TABLET | Freq: Every day | ORAL | 5 refills | Status: DC

## 2021-11-02 NOTE — Progress Notes (Signed)
Established Patient Office Visit  Subjective:  Patient ID: Paul Madden, male    DOB: 1955/06/08  Age: 66 y.o. MRN: [id number]  CC:  Chief Complaint  Patient presents with   Follow-up    Follow up pt had right big toe taken off 2 weeks ago he is in a lot of pain and running out of pain medication    HPI Paul Madden is a 66 y.o. male with past medical history of HTN, chronic low back pain, tobacco abuse and alcohol use who presents for f/u after recent hospitalization for critical limb ischemia of right LE.  He went to ED 10/19/2021 with right great toe pain after toenail was removed several months ago-reported serosanguineous fluid and change of color 2 weeks prior, this got acutely worse. Patient was started on doxycycline 5/17 Had outpatient ABIs showing critical limb ischemia   10/24/2021 underwent harvest R greater saphenous vein + R CF endarterectomy, RCF-->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 Donzetta Matters.  He has been taking aspirin and they are doing well.  His BP is WNL.  He has been taking Norco as needed for severe pain and requests a refill of it.  He has been changing the dressing every day, and denies any excessive bleeding or puslike discharge.     Past Medical History:  Diagnosis Date   Chronic low back pain    Hypertension     Past Surgical History:  Procedure Laterality Date   AMPUTATION TOE Right 10/24/2021   Procedure: AMPUTATION OF THE RIGHT GREAT TOE;  Surgeon: Waynetta Sandy, MD;  Location: [place];  Service: Vascular;  Laterality: Right;   FEMORAL-POPLITEAL BYPASS GRAFT Right 10/24/2021   Procedure: RIGHT BYPASS GRAFT COMMON FEMORAL-POPLITEAL ARTERY;  Surgeon: Waynetta Sandy, MD;  Location: [place];  Service: Vascular;  Laterality: Right;    History reviewed. No pertinent family history.  Social History   Socioeconomic History   Marital status: Single    Spouse name: Not on file   Number of children: Not on file    Years of education: Not on file   Highest education level: Not on file  Occupational History   Not on file  Tobacco Use   Smoking status: Every Day   Smokeless tobacco: Never  Substance and Sexual Activity   Alcohol use: Yes    Comment: occ   Drug use: No   Sexual activity: Not on file  Other Topics Concern   Not on file  Social History Narrative   Not on file   Social Determinants of Health   Financial Resource Strain: Not on file  Food Insecurity: Not on file  Transportation Needs: Not on file  Physical Activity: Not on file  Stress: Not on file  Social Connections: Not on file  Intimate Partner Violence: Not on file    Outpatient Medications Prior to Visit  Medication Sig Dispense Refill   acetaminophen (TYLENOL 8 HOUR) 650 MG CR tablet Take 1 tablet (650 mg total) by mouth every 8 (eight) hours as needed for pain. 30 tablet 0   amLODipine (NORVASC) 10 MG tablet Take 1 tablet (10 mg total) by mouth daily. 30 tablet 11   aspirin EC 81 MG tablet Take 1 tablet (81 mg total) by mouth daily. Swallow whole. 30 tablet 12   atorvastatin (LIPITOR) 80 MG tablet Take 1 tablet (80 mg total) by mouth at bedtime. 30 tablet 11   MULTIPLE VITAMINS PO Take by mouth.  nicotine (NICODERM CQ - DOSED IN MG/24 HOURS) 21 mg/24hr patch Place 1 patch (21 mg total) onto the skin daily. 28 patch 0   polyethylene glycol powder (GLYCOLAX/MIRALAX) 17 GM/SCOOP powder Take 17 g by mouth daily. 238 g 0   hydrALAZINE (APRESOLINE) 25 MG tablet Take 1 tablet (25 mg total) by mouth every 4 (four) hours as needed (use 2nd if unable to use labetalol. For SBP>160 OR DBP>100). 90 tablet 11   oxyCODONE-acetaminophen (PERCOCET/ROXICET) 5-325 MG tablet Take 1-2 tablets by mouth every 4 (four) hours as needed for moderate pain. 30 tablet 0   No facility-administered medications prior to visit.    No Known Allergies  ROS Review of Systems  Constitutional:  Negative for chills and fever.  HENT:  Negative for  congestion and sore throat.   Eyes:  Negative for pain and discharge.  Respiratory:  Negative for cough and shortness of breath.   Cardiovascular:  Negative for chest pain and palpitations.  Gastrointestinal:  Negative for constipation, diarrhea, nausea and vomiting.  Endocrine: Negative for polydipsia and polyuria.  Genitourinary:  Negative for dysuria and hematuria.  Musculoskeletal:  Negative for neck pain and neck stiffness.       Right foot pain  Skin:  Positive for wound.  Neurological:  Negative for dizziness, weakness, numbness and headaches.  Psychiatric/Behavioral:  Negative for agitation and behavioral problems.       Objective:    Physical Exam Vitals reviewed.  Constitutional:      General: He is not in acute distress.    Appearance: He is not diaphoretic.  HENT:     Head: Normocephalic and atraumatic.     Nose: Nose normal.     Mouth/Throat:     Mouth: Mucous membranes are moist.  Eyes:     General: No scleral icterus.    Extraocular Movements: Extraocular movements intact.  Cardiovascular:     Rate and Rhythm: Normal rate and regular rhythm.     Heart sounds: Normal heart sounds. No murmur heard. Pulmonary:     Breath sounds: Normal breath sounds. No wheezing or rales.  Abdominal:     Palpations: Abdomen is soft.     Tenderness: There is no abdominal tenderness.  Musculoskeletal:     Cervical back: Neck supple. No tenderness.     Right lower leg: No edema.     Left lower leg: No edema.     Comments:    Skin:    General: Skin is warm.     Findings: No rash.     Comments: Dressing over right foot  Neurological:     General: No focal deficit present.     Mental Status: He is alert and oriented to person, place, and time.     Sensory: Sensory deficit (B/l hands) present.     Motor: Weakness (B/l LE - 4/5) present.  Psychiatric:        Mood and Affect: Mood normal.        Behavior: Behavior normal.   S/  BP 118/82 (BP Location: Right Arm, Patient  Position: Sitting, Cuff Size: Normal)   Pulse (!) 104   Resp 18   Ht _0  (1.753 m)   Wt 131 lb 9.6 oz (59.7 kg)   SpO2 100%   BMI 19.43 kg/m  Wt Readings from Last 3 Encounters:  11/01/21 131 lb 9.6 oz (59.7 kg)  10/24/21 125 lb (56.7 kg)  05/25/21 140 lb (63.5 kg)    No results found for: "  TSH" Lab Results  Component Value Date   WBC 9.5 11/01/2021   HGB 9.1 (L) 11/01/2021   HCT 28.4 (L) 11/01/2021   MCV 89 11/01/2021   PLT 790 (H) 11/01/2021   Lab Results  Component Value Date   NA 145 (H) 11/01/2021   K 4.8 11/01/2021   CO2 19 (L) 11/01/2021   GLUCOSE 93 11/01/2021   BUN 19 11/01/2021   CREATININE 0.97 11/01/2021   BILITOT 0.2 (L) 10/23/2021   ALKPHOS 74 10/23/2021   AST 28 10/23/2021   ALT 17 10/23/2021   PROT 6.9 10/23/2021   ALBUMIN 3.2 (L) 10/23/2021   CALCIUM 9.5 11/01/2021   ANIONGAP 7 10/26/2021   EGFR 87 11/01/2021   Lab Results  Component Value Date   CHOL 81 10/25/2021   Lab Results  Component Value Date   HDL 30 (L) 10/25/2021   Lab Results  Component Value Date   LDLCALC 36 10/25/2021   Lab Results  Component Value Date   TRIG 74 10/25/2021   Lab Results  Component Value Date   CHOLHDL 2.7 10/25/2021   Lab Results  Component Value Date   HGBA1C 5.2 10/21/2021      Assessment & Plan:   Problem List Items Addressed This Visit       Cardiovascular and Mediastinum   Essential hypertension    BP: 118/82   Well-controlled with Amlodipine and Hydralazine Counseled for compliance with the medications Advised DASH diet and moderate exercise/walking, at least 150 mins/week      Critical limb ischemia of both lower extremities ([place])    S/p below below-knee pop bypass with right great toe amputation On aspirin and statin now followed by vascular surgery Has severe foot pain, refilled Percocet for now      Relevant Medications   oxyCODONE-acetaminophen (PERCOCET/ROXICET) 5-325 MG tablet     Other   Tobacco abuse    Asked  about quitting: confirms that he/she currently smokes cigarettes Advise to quit smoking: Educated about QUITTING to reduce the risk of cancer, cardio and cerebrovascular disease. Assess willingness: Unwilling to quit at this time, but is working on cutting back. Assist with counseling and pharmacotherapy: Counseled for 5 minutes and literature provided. Arrange for follow up: follow up in 3 months and continue to offer help.      Substance abuse ([place])    Has used cocaine a day before admission Advised to avoid using any illicit drug      Hospital discharge follow-up [facility] chart reviewed, including discharge summary Medications reconciled and reviewed with the patient Check CBC as he had drop in Hb after procedure Advised to start iron supplement      Relevant Orders   CBC (Completed)   Basic Metabolic Panel (BMET) (Completed)    Meds ordered this encounter  Medications   oxyCODONE-acetaminophen (PERCOCET/ROXICET) 5-325 MG tablet    Sig: Take 1 tablet by mouth every 8 (eight) hours as needed for moderate pain.    Dispense:  30 tablet    Refill:  0    Follow-up: Return in about 3 months (around 02/01/2022) for Annual physical.    Lindell Spar, MD

## 2021-11-02 NOTE — Assessment & Plan Note (Signed)
Asked about quitting: confirms that he/she currently smokes cigarettes Advise to quit smoking: Educated about QUITTING to reduce the risk of cancer, cardio and cerebrovascular disease. Assess willingness: Unwilling to quit at this time, but is working on cutting back. Assist with counseling and pharmacotherapy: Counseled for 5 minutes and literature provided. Arrange for follow up: follow up in 3 months and continue to offer help.  

## 2021-11-02 NOTE — Assessment & Plan Note (Addendum)
S/p below below-knee pop bypass with right great toe amputation On aspirin and statin now followed by vascular surgery Has severe foot pain, refilled Percocet for now

## 2021-11-02 NOTE — Assessment & Plan Note (Addendum)
Hospital chart reviewed, including discharge summary Medications reconciled and reviewed with the patient Check CBC as he had drop in Hb after procedure Advised to start iron supplement

## 2021-11-02 NOTE — Addendum Note (Signed)
Addended byTrena Platt on: 11/02/2021 05:38 PM   Modules accepted: Orders

## 2021-11-02 NOTE — Assessment & Plan Note (Signed)
Has used cocaine a day before admission Advised to avoid using any illicit drug

## 2021-11-06 ENCOUNTER — Telehealth: Payer: Self-pay

## 2021-11-06 NOTE — Telephone Encounter (Signed)
Paul Madden with [facility] called stating that there was an order for an OT eval and treat, but the pt stated that he was independent with his ADL's and did not need any assistance. She wanted to make sure his refusal was notated and he was doing well.

## 2021-11-08 ENCOUNTER — Encounter: Payer: Self-pay | Admitting: Podiatry

## 2021-11-08 ENCOUNTER — Ambulatory Visit (INDEPENDENT_AMBULATORY_CARE_PROVIDER_SITE_OTHER): Payer: Medicare Other | Admitting: Podiatry

## 2021-11-08 DIAGNOSIS — I739 Peripheral vascular disease, unspecified: Secondary | ICD-10-CM | POA: Diagnosis not present

## 2021-11-08 DIAGNOSIS — S98111A Complete traumatic amputation of right great toe, initial encounter: Secondary | ICD-10-CM

## 2021-11-08 DIAGNOSIS — G609 Hereditary and idiopathic neuropathy, unspecified: Secondary | ICD-10-CM

## 2021-11-08 DIAGNOSIS — T8130XA Disruption of wound, unspecified, initial encounter: Secondary | ICD-10-CM | POA: Diagnosis not present

## 2021-11-08 NOTE — Progress Notes (Signed)
  Subjective:  Patient ID: Paul Madden, male    DOB: Oct 19, 1955,   MRN: [id number]  Chief Complaint  Patient presents with   Wound Check    wound check    66 y.o. male presents for follow up of right hallux wound. Since last visit was admitted to hospital after ABIs and no blood flow. Had emergent vascular intervention and hallux on right was amputated by vascular. Here today for follow-up. Has been dressing wound every other day with betadine and keeping area clean.  Denies any other pedal complaints. Denies n/v/f/c.   Past Medical History:  Diagnosis Date   Chronic low back pain    Hypertension     Objective:  Physical Exam: Vascular: DP/PT pulses 2/4 bilateral. CFT <3 seconds. Normal hair growth on digits. No edema.  Skin. No lacerations or abrasions bilateral feet. Ulcer right hallux measuring 3 cm x 4 cm x 0.5 cm to distal lateral hallux nail bed with necrotic base and draniage. Malodor present. Distal hallux bone exposed. . Mild maceration surrounding. Very tender to touch.  Musculoskeletal: MMT 5/5 bilateral lower extremities in DF, PF, Inversion and Eversion. Deceased ROM in DF of ankle joint.  Neurological: Sensation intact to light touch.   Assessment:   1. Idiopathic peripheral neuropathy   2. PAD (peripheral artery disease) ([place])   3. Amputation of right great toe ([place])   4. Wound dehiscence          Plan:  Patient was evaluated and treated and all questions answered. Ulcer right hallux partial amputation with wound dehiscence.  -Unable to debride today due to pain. Washed wound thoroughly and redressed with betadine.  -Dressed with betadine, DSD. -Off-loading with surgical shoe. -Discussed glucose control and proper protein-rich diet.  -Discussed if any worsening redness, pain, fever or chills to call or may need to report to the emergency room. Patient expressed understanding.    No follow-ups on file.      No follow-ups on file.     No  follow-ups on file.   Lorenda Peck, DPM

## 2021-11-21 ENCOUNTER — Ambulatory Visit (INDEPENDENT_AMBULATORY_CARE_PROVIDER_SITE_OTHER): Payer: Medicare Other | Admitting: Podiatry

## 2021-11-21 ENCOUNTER — Encounter: Payer: Self-pay | Admitting: Podiatry

## 2021-11-21 DIAGNOSIS — G609 Hereditary and idiopathic neuropathy, unspecified: Secondary | ICD-10-CM

## 2021-11-21 DIAGNOSIS — T8130XA Disruption of wound, unspecified, initial encounter: Secondary | ICD-10-CM | POA: Diagnosis not present

## 2021-11-21 DIAGNOSIS — S98111A Complete traumatic amputation of right great toe, initial encounter: Secondary | ICD-10-CM | POA: Diagnosis not present

## 2021-11-21 DIAGNOSIS — I739 Peripheral vascular disease, unspecified: Secondary | ICD-10-CM | POA: Diagnosis not present

## 2021-11-25 DIAGNOSIS — M869 Osteomyelitis, unspecified: Secondary | ICD-10-CM | POA: Diagnosis not present

## 2021-11-25 DIAGNOSIS — M5136 Other intervertebral disc degeneration, lumbar region: Secondary | ICD-10-CM | POA: Diagnosis not present

## 2021-11-25 DIAGNOSIS — F1910 Other psychoactive substance abuse, uncomplicated: Secondary | ICD-10-CM | POA: Diagnosis not present

## 2021-11-25 DIAGNOSIS — I70223 Atherosclerosis of native arteries of extremities with rest pain, bilateral legs: Secondary | ICD-10-CM | POA: Diagnosis not present

## 2021-12-20 ENCOUNTER — Ambulatory Visit (INDEPENDENT_AMBULATORY_CARE_PROVIDER_SITE_OTHER): Payer: Medicare Other | Admitting: Podiatry

## 2021-12-20 DIAGNOSIS — Z91199 Patient's noncompliance with other medical treatment and regimen due to unspecified reason: Secondary | ICD-10-CM

## 2021-12-20 NOTE — Progress Notes (Signed)
No show

## 2021-12-26 DIAGNOSIS — M5136 Other intervertebral disc degeneration, lumbar region: Secondary | ICD-10-CM | POA: Diagnosis not present

## 2021-12-26 DIAGNOSIS — F1910 Other psychoactive substance abuse, uncomplicated: Secondary | ICD-10-CM | POA: Diagnosis not present

## 2021-12-26 DIAGNOSIS — M869 Osteomyelitis, unspecified: Secondary | ICD-10-CM | POA: Diagnosis not present

## 2021-12-26 DIAGNOSIS — I70223 Atherosclerosis of native arteries of extremities with rest pain, bilateral legs: Secondary | ICD-10-CM | POA: Diagnosis not present

## 2022-01-26 DIAGNOSIS — I70223 Atherosclerosis of native arteries of extremities with rest pain, bilateral legs: Secondary | ICD-10-CM | POA: Diagnosis not present

## 2022-01-26 DIAGNOSIS — F1910 Other psychoactive substance abuse, uncomplicated: Secondary | ICD-10-CM | POA: Diagnosis not present

## 2022-01-26 DIAGNOSIS — M869 Osteomyelitis, unspecified: Secondary | ICD-10-CM | POA: Diagnosis not present

## 2022-01-26 DIAGNOSIS — M5136 Other intervertebral disc degeneration, lumbar region: Secondary | ICD-10-CM | POA: Diagnosis not present

## 2022-02-25 DIAGNOSIS — I70223 Atherosclerosis of native arteries of extremities with rest pain, bilateral legs: Secondary | ICD-10-CM | POA: Diagnosis not present

## 2022-02-25 DIAGNOSIS — M5136 Other intervertebral disc degeneration, lumbar region: Secondary | ICD-10-CM | POA: Diagnosis not present

## 2022-02-25 DIAGNOSIS — M869 Osteomyelitis, unspecified: Secondary | ICD-10-CM | POA: Diagnosis not present

## 2022-02-25 DIAGNOSIS — F1910 Other psychoactive substance abuse, uncomplicated: Secondary | ICD-10-CM | POA: Diagnosis not present

## 2022-03-28 DIAGNOSIS — M869 Osteomyelitis, unspecified: Secondary | ICD-10-CM | POA: Diagnosis not present

## 2022-03-28 DIAGNOSIS — M5136 Other intervertebral disc degeneration, lumbar region: Secondary | ICD-10-CM | POA: Diagnosis not present

## 2022-03-28 DIAGNOSIS — I70223 Atherosclerosis of native arteries of extremities with rest pain, bilateral legs: Secondary | ICD-10-CM | POA: Diagnosis not present

## 2022-03-28 DIAGNOSIS — F1910 Other psychoactive substance abuse, uncomplicated: Secondary | ICD-10-CM | POA: Diagnosis not present

## 2022-05-28 DIAGNOSIS — I70223 Atherosclerosis of native arteries of extremities with rest pain, bilateral legs: Secondary | ICD-10-CM | POA: Diagnosis not present

## 2022-05-28 DIAGNOSIS — M869 Osteomyelitis, unspecified: Secondary | ICD-10-CM | POA: Diagnosis not present

## 2022-05-28 DIAGNOSIS — F1910 Other psychoactive substance abuse, uncomplicated: Secondary | ICD-10-CM | POA: Diagnosis not present

## 2022-05-28 DIAGNOSIS — M5136 Other intervertebral disc degeneration, lumbar region: Secondary | ICD-10-CM | POA: Diagnosis not present

## 2022-06-28 DIAGNOSIS — F1910 Other psychoactive substance abuse, uncomplicated: Secondary | ICD-10-CM | POA: Diagnosis not present

## 2022-06-28 DIAGNOSIS — M5136 Other intervertebral disc degeneration, lumbar region: Secondary | ICD-10-CM | POA: Diagnosis not present

## 2022-06-28 DIAGNOSIS — I70223 Atherosclerosis of native arteries of extremities with rest pain, bilateral legs: Secondary | ICD-10-CM | POA: Diagnosis not present

## 2022-06-28 DIAGNOSIS — M869 Osteomyelitis, unspecified: Secondary | ICD-10-CM | POA: Diagnosis not present

## 2022-07-27 DIAGNOSIS — I70223 Atherosclerosis of native arteries of extremities with rest pain, bilateral legs: Secondary | ICD-10-CM | POA: Diagnosis not present

## 2022-07-27 DIAGNOSIS — F1910 Other psychoactive substance abuse, uncomplicated: Secondary | ICD-10-CM | POA: Diagnosis not present

## 2022-07-27 DIAGNOSIS — M5136 Other intervertebral disc degeneration, lumbar region: Secondary | ICD-10-CM | POA: Diagnosis not present

## 2022-07-27 DIAGNOSIS — M869 Osteomyelitis, unspecified: Secondary | ICD-10-CM | POA: Diagnosis not present

## 2022-08-27 DIAGNOSIS — M869 Osteomyelitis, unspecified: Secondary | ICD-10-CM | POA: Diagnosis not present

## 2022-08-27 DIAGNOSIS — M5136 Other intervertebral disc degeneration, lumbar region: Secondary | ICD-10-CM | POA: Diagnosis not present

## 2022-08-27 DIAGNOSIS — F1910 Other psychoactive substance abuse, uncomplicated: Secondary | ICD-10-CM | POA: Diagnosis not present

## 2022-08-27 DIAGNOSIS — I70223 Atherosclerosis of native arteries of extremities with rest pain, bilateral legs: Secondary | ICD-10-CM | POA: Diagnosis not present

## 2022-09-26 DIAGNOSIS — M5136 Other intervertebral disc degeneration, lumbar region: Secondary | ICD-10-CM | POA: Diagnosis not present

## 2022-09-26 DIAGNOSIS — F1910 Other psychoactive substance abuse, uncomplicated: Secondary | ICD-10-CM | POA: Diagnosis not present

## 2022-09-26 DIAGNOSIS — M869 Osteomyelitis, unspecified: Secondary | ICD-10-CM | POA: Diagnosis not present

## 2022-09-26 DIAGNOSIS — I70223 Atherosclerosis of native arteries of extremities with rest pain, bilateral legs: Secondary | ICD-10-CM | POA: Diagnosis not present

## 2022-10-27 DIAGNOSIS — M869 Osteomyelitis, unspecified: Secondary | ICD-10-CM | POA: Diagnosis not present

## 2022-10-27 DIAGNOSIS — M5136 Other intervertebral disc degeneration, lumbar region: Secondary | ICD-10-CM | POA: Diagnosis not present

## 2022-10-27 DIAGNOSIS — I70223 Atherosclerosis of native arteries of extremities with rest pain, bilateral legs: Secondary | ICD-10-CM | POA: Diagnosis not present

## 2022-10-27 DIAGNOSIS — F1910 Other psychoactive substance abuse, uncomplicated: Secondary | ICD-10-CM | POA: Diagnosis not present

## 2022-11-26 DIAGNOSIS — M5136 Other intervertebral disc degeneration, lumbar region: Secondary | ICD-10-CM | POA: Diagnosis not present

## 2022-11-26 DIAGNOSIS — F1910 Other psychoactive substance abuse, uncomplicated: Secondary | ICD-10-CM | POA: Diagnosis not present

## 2022-11-26 DIAGNOSIS — M869 Osteomyelitis, unspecified: Secondary | ICD-10-CM | POA: Diagnosis not present

## 2022-11-26 DIAGNOSIS — I70223 Atherosclerosis of native arteries of extremities with rest pain, bilateral legs: Secondary | ICD-10-CM | POA: Diagnosis not present

## 2022-12-27 DIAGNOSIS — M5136 Other intervertebral disc degeneration, lumbar region: Secondary | ICD-10-CM | POA: Diagnosis not present

## 2022-12-27 DIAGNOSIS — F1910 Other psychoactive substance abuse, uncomplicated: Secondary | ICD-10-CM | POA: Diagnosis not present

## 2022-12-27 DIAGNOSIS — I70223 Atherosclerosis of native arteries of extremities with rest pain, bilateral legs: Secondary | ICD-10-CM | POA: Diagnosis not present

## 2022-12-27 DIAGNOSIS — M869 Osteomyelitis, unspecified: Secondary | ICD-10-CM | POA: Diagnosis not present

## 2023-01-27 DIAGNOSIS — M5136 Other intervertebral disc degeneration, lumbar region: Secondary | ICD-10-CM | POA: Diagnosis not present

## 2023-01-27 DIAGNOSIS — M869 Osteomyelitis, unspecified: Secondary | ICD-10-CM | POA: Diagnosis not present

## 2023-01-27 DIAGNOSIS — I70223 Atherosclerosis of native arteries of extremities with rest pain, bilateral legs: Secondary | ICD-10-CM | POA: Diagnosis not present

## 2023-01-27 DIAGNOSIS — F1910 Other psychoactive substance abuse, uncomplicated: Secondary | ICD-10-CM | POA: Diagnosis not present

## 2023-02-26 DIAGNOSIS — M869 Osteomyelitis, unspecified: Secondary | ICD-10-CM | POA: Diagnosis not present

## 2023-02-26 DIAGNOSIS — F1910 Other psychoactive substance abuse, uncomplicated: Secondary | ICD-10-CM | POA: Diagnosis not present

## 2023-02-26 DIAGNOSIS — I70223 Atherosclerosis of native arteries of extremities with rest pain, bilateral legs: Secondary | ICD-10-CM | POA: Diagnosis not present

## 2023-03-29 DIAGNOSIS — F1910 Other psychoactive substance abuse, uncomplicated: Secondary | ICD-10-CM | POA: Diagnosis not present

## 2023-03-29 DIAGNOSIS — M869 Osteomyelitis, unspecified: Secondary | ICD-10-CM | POA: Diagnosis not present

## 2023-03-29 DIAGNOSIS — I70223 Atherosclerosis of native arteries of extremities with rest pain, bilateral legs: Secondary | ICD-10-CM | POA: Diagnosis not present

## 2023-04-28 DIAGNOSIS — M869 Osteomyelitis, unspecified: Secondary | ICD-10-CM | POA: Diagnosis not present

## 2023-04-28 DIAGNOSIS — I70223 Atherosclerosis of native arteries of extremities with rest pain, bilateral legs: Secondary | ICD-10-CM | POA: Diagnosis not present

## 2023-04-28 DIAGNOSIS — F1910 Other psychoactive substance abuse, uncomplicated: Secondary | ICD-10-CM | POA: Diagnosis not present

## 2023-05-29 DIAGNOSIS — F1910 Other psychoactive substance abuse, uncomplicated: Secondary | ICD-10-CM | POA: Diagnosis not present

## 2023-05-29 DIAGNOSIS — M869 Osteomyelitis, unspecified: Secondary | ICD-10-CM | POA: Diagnosis not present

## 2023-05-29 DIAGNOSIS — I70223 Atherosclerosis of native arteries of extremities with rest pain, bilateral legs: Secondary | ICD-10-CM | POA: Diagnosis not present

## 2023-06-17 ENCOUNTER — Other Ambulatory Visit: Payer: Self-pay | Admitting: Internal Medicine

## 2023-06-17 ENCOUNTER — Encounter: Payer: Self-pay | Admitting: Internal Medicine

## 2023-06-17 ENCOUNTER — Emergency Department (HOSPITAL_COMMUNITY)
Admission: EM | Admit: 2023-06-17 | Discharge: 2023-06-17 | Discharge status: Against Medical Advice | Payer: Medicare HMO | Source: Home / Self Care | Attending: Emergency Medicine | Admitting: Emergency Medicine

## 2023-06-17 ENCOUNTER — Ambulatory Visit: Payer: Medicare HMO | Admitting: Internal Medicine

## 2023-06-17 ENCOUNTER — Other Ambulatory Visit: Payer: Self-pay

## 2023-06-17 VITALS — BP 142/84 | HR 107 | Ht 69.0 in | Wt 120.0 lb

## 2023-06-17 DIAGNOSIS — Z7982 Long term (current) use of aspirin: Secondary | ICD-10-CM | POA: Insufficient documentation

## 2023-06-17 DIAGNOSIS — Z5329 Procedure and treatment not carried out because of patient's decision for other reasons: Secondary | ICD-10-CM | POA: Insufficient documentation

## 2023-06-17 DIAGNOSIS — Z1211 Encounter for screening for malignant neoplasm of colon: Secondary | ICD-10-CM

## 2023-06-17 DIAGNOSIS — I70223 Atherosclerosis of native arteries of extremities with rest pain, bilateral legs: Secondary | ICD-10-CM | POA: Diagnosis not present

## 2023-06-17 DIAGNOSIS — I10 Essential (primary) hypertension: Secondary | ICD-10-CM | POA: Diagnosis not present

## 2023-06-17 DIAGNOSIS — I998 Other disorder of circulatory system: Secondary | ICD-10-CM | POA: Diagnosis not present

## 2023-06-17 DIAGNOSIS — D649 Anemia, unspecified: Secondary | ICD-10-CM

## 2023-06-17 DIAGNOSIS — I70262 Atherosclerosis of native arteries of extremities with gangrene, left leg: Secondary | ICD-10-CM | POA: Diagnosis not present

## 2023-06-17 DIAGNOSIS — Z720 Tobacco use: Secondary | ICD-10-CM | POA: Diagnosis not present

## 2023-06-17 DIAGNOSIS — F17200 Nicotine dependence, unspecified, uncomplicated: Secondary | ICD-10-CM | POA: Insufficient documentation

## 2023-06-17 DIAGNOSIS — Z5321 Procedure and treatment not carried out due to patient leaving prior to being seen by health care provider: Secondary | ICD-10-CM

## 2023-06-17 DIAGNOSIS — M79675 Pain in left toe(s): Secondary | ICD-10-CM | POA: Insufficient documentation

## 2023-06-17 DIAGNOSIS — I739 Peripheral vascular disease, unspecified: Secondary | ICD-10-CM | POA: Insufficient documentation

## 2023-06-17 MED ORDER — ASPIRIN 81 MG PO TBEC: 81.0000 mg | DELAYED_RELEASE_TABLET | Freq: Every day | ORAL | 12 refills | Status: DC

## 2023-06-17 MED ORDER — AMLODIPINE BESYLATE 10 MG PO TABS: 10.0000 mg | ORAL_TABLET | Freq: Every day | ORAL | 5 refills | Status: DC

## 2023-06-17 MED ORDER — HYDROCODONE-ACETAMINOPHEN 5-325 MG PO TABS
1.0000 | ORAL_TABLET | Freq: Once | ORAL | Status: AC
  Administered 2023-06-17: 1 via ORAL
  Filled 2023-06-17: qty 1

## 2023-06-17 MED ORDER — ATORVASTATIN CALCIUM 80 MG PO TABS: 80.0000 mg | ORAL_TABLET | Freq: Every day | ORAL | 5 refills | Status: DC

## 2023-06-17 NOTE — Assessment & Plan Note (Signed)
Asked about quitting: confirms that he/she currently smokes cigarettes Advise to quit smoking: Educated about QUITTING to reduce the risk of cancer, cardio and cerebrovascular disease. Assess willingness: Unwilling to quit at this time, but is working on cutting back. Assist with counseling and pharmacotherapy: Counseled for 5 minutes. Arrange for follow up: follow up in 3 months and continue to offer help.

## 2023-06-17 NOTE — Progress Notes (Signed)
Established Patient Office Visit  Subjective:  Patient ID: Paul Madden, male    DOB: 17-Dec-1955  Age: 68 y.o. MRN: [id number]  CC:  Chief Complaint  Patient presents with   Follow-up    Pt reports a f/u from left foot small toe pain, was seen by urgent care.     HPI Paul Madden is a 68 y.o. male with past medical history of HTN, chronic low back pain, tobacco abuse and alcohol use who presents for f/u of his chronic medical conditions.  He was last seen in 06/23.  He complains of left small toe pain for the last 2 weeks.  He was seen by urgent care, had local wound cleaning and was given clindamycin.  He still reports severe left small toe pain, with blackish discoloration.  He reports that his nail has fallen off, without trauma.  His DPA pulse is absent on LLE.  He has history of right great toe amputation due to critical limb ischemia/PAD.  He has stopped taking aspirin and statin for the last 1 year when he had moved to [place].  He had lost follow-up with podiatry and vascular surgery as well.  His BP was elevated today, has run out of amlodipine.  Denies any headache, dizziness, chest pain, dyspnea or palpitations.   Past Medical History:  Diagnosis Date   Chronic low back pain    Hypertension     Past Surgical History:  Procedure Laterality Date   AMPUTATION TOE Right 10/24/2021   Procedure: AMPUTATION OF THE RIGHT GREAT TOE;  Surgeon: Maeola Harman, MD;  Location: [facility] OR;  Service: Vascular;  Laterality: Right;   FEMORAL-POPLITEAL BYPASS GRAFT Right 10/24/2021   Procedure: RIGHT BYPASS GRAFT COMMON FEMORAL-POPLITEAL ARTERY;  Surgeon: Maeola Harman, MD;  Location: [facility] OR;  Service: Vascular;  Laterality: Right;    History reviewed. No pertinent family history.  Social History   Socioeconomic History   Marital status: Single    Spouse name: Not on file   Number of children: Not on file   Years of education: Not on file   Highest education  level: Not on file  Occupational History   Not on file  Tobacco Use   Smoking status: Every Day   Smokeless tobacco: Never  Substance and Sexual Activity   Alcohol use: Yes    Comment: occ   Drug use: No   Sexual activity: Not on file  Other Topics Concern   Not on file  Social History Narrative   Not on file   Social Drivers of Health   Financial Resource Strain: Not on file  Food Insecurity: Not on file  Transportation Needs: Not on file  Physical Activity: Not on file  Stress: Not on file  Social Connections: Not on file  Intimate Partner Violence: Not on file    Outpatient Medications Prior to Visit  Medication Sig Dispense Refill   acetaminophen (TYLENOL 8 HOUR) 650 MG CR tablet Take 1 tablet (650 mg total) by mouth every 8 (eight) hours as needed for pain. 30 tablet 0   clindamycin (CLEOCIN) 150 MG capsule Take 150 mg by mouth 4 (four) times daily.     Iron, Ferrous Sulfate, 325 (65 Fe) MG TABS Take 325 mg by mouth daily. 30 tablet 5   MULTIPLE VITAMINS PO Take by mouth.     nicotine (NICODERM CQ - DOSED IN MG/24 HOURS) 21 mg/24hr patch Place 1 patch (21 mg total) onto the skin daily. 28 patch 0  oxyCODONE-acetaminophen (PERCOCET/ROXICET) 5-325 MG tablet Take 1 tablet by mouth every 8 (eight) hours as needed for moderate pain. 30 tablet 0   polyethylene glycol powder (GLYCOLAX/MIRALAX) 17 GM/SCOOP powder Take 17 g by mouth daily. 238 g 0   amLODipine (NORVASC) 10 MG tablet Take 1 tablet (10 mg total) by mouth daily. 30 tablet 11   aspirin EC 81 MG tablet Take 1 tablet (81 mg total) by mouth daily. Swallow whole. 30 tablet 12   atorvastatin (LIPITOR) 80 MG tablet Take 1 tablet (80 mg total) by mouth at bedtime. 30 tablet 11   No facility-administered medications prior to visit.    No Known Allergies  ROS Review of Systems  Constitutional:  Negative for chills and fever.  HENT:  Negative for congestion and sore throat.   Eyes:  Negative for pain and discharge.   Respiratory:  Negative for cough and shortness of breath.   Cardiovascular:  Negative for chest pain and palpitations.  Gastrointestinal:  Negative for constipation, diarrhea, nausea and vomiting.  Endocrine: Negative for polydipsia and polyuria.  Genitourinary:  Negative for dysuria and hematuria.  Musculoskeletal:  Negative for neck pain and neck stiffness.       Lett foot pain  Skin:  Positive for wound.  Neurological:  Negative for dizziness, weakness, numbness and headaches.  Psychiatric/Behavioral:  Negative for agitation and behavioral problems.       Objective:    Physical Exam Vitals reviewed.  Constitutional:      General: He is not in acute distress.    Appearance: He is not diaphoretic.  HENT:     Head: Normocephalic and atraumatic.     Nose: Nose normal.     Mouth/Throat:     Mouth: Mucous membranes are moist.  Eyes:     General: No scleral icterus.    Extraocular Movements: Extraocular movements intact.  Cardiovascular:     Rate and Rhythm: Normal rate and regular rhythm.     Heart sounds: Normal heart sounds. No murmur heard.    Comments: DPA pulse diminished on b/l LE Pulmonary:     Breath sounds: Normal breath sounds. No wheezing or rales.  Abdominal:     Palpations: Abdomen is soft.     Tenderness: There is no abdominal tenderness.  Musculoskeletal:     Cervical back: Neck supple. No tenderness.     Right lower leg: No edema.     Left lower leg: No edema.     Comments:    Skin:    General: Skin is warm.     Findings: No rash.     Comments: Left 5th toe blackish discoloration - ?gangrene  Neurological:     General: No focal deficit present.     Mental Status: He is alert and oriented to person, place, and time.     Sensory: Sensory deficit (B/l hands) present.     Motor: Weakness (B/l LE - 4/5) present.  Psychiatric:        Mood and Affect: Mood normal.        Behavior: Behavior normal.      BP (!) 142/84 (BP Location: Left Arm)   Pulse  (!) 107   Ht 5\' 9"  (1.753 m)   Wt 120 lb 0.6 oz (54.4 kg)   SpO2 94%   BMI 17.73 kg/m  Wt Readings from Last 3 Encounters:  06/17/23 120 lb 0.6 oz (54.4 kg)  11/01/21 131 lb 9.6 oz (59.7 kg)  10/24/21 125 lb (56.7 kg)  No results found for: "TSH" Lab Results  Component Value Date   WBC 9.5 11/01/2021   HGB 9.1 (L) 11/01/2021   HCT 28.4 (L) 11/01/2021   MCV 89 11/01/2021   PLT 790 (H) 11/01/2021   Lab Results  Component Value Date   NA 145 (H) 11/01/2021   K 4.8 11/01/2021   CO2 19 (L) 11/01/2021   GLUCOSE 93 11/01/2021   BUN 19 11/01/2021   CREATININE 0.97 11/01/2021   BILITOT 0.2 (L) 10/23/2021   ALKPHOS 74 10/23/2021   AST 28 10/23/2021   ALT 17 10/23/2021   PROT 6.9 10/23/2021   ALBUMIN 3.2 (L) 10/23/2021   CALCIUM 9.5 11/01/2021   ANIONGAP 7 10/26/2021   EGFR 87 11/01/2021   Lab Results  Component Value Date   CHOL 81 10/25/2021   Lab Results  Component Value Date   HDL 30 (L) 10/25/2021   Lab Results  Component Value Date   LDLCALC 36 10/25/2021   Lab Results  Component Value Date   TRIG 74 10/25/2021   Lab Results  Component Value Date   CHOLHDL 2.7 10/25/2021   Lab Results  Component Value Date   HGBA1C 5.2 10/21/2021      Assessment & Plan:   Problem List Items Addressed This Visit       Cardiovascular and Mediastinum   Essential hypertension   BP: (!) 142/84   Uncontrolled due to noncompliance  Restart Amlodipine 10 mg QD Counseled for compliance with the medications Advised DASH diet and moderate exercise/walking as tolerated      Relevant Medications   amLODipine (NORVASC) 10 MG tablet   atorvastatin (LIPITOR) 80 MG tablet   aspirin EC 81 MG tablet   Critical limb ischemia of both lower extremities (HCC) - Primary   S/p below below-knee pop bypass with right great toe amputation Was on aspirin and statin, needs to restart immediately Urgent referral sent to vascular surgery due to concern for possible  gangrene Referred to ER for evaluation for possible gangrene      Relevant Medications   amLODipine (NORVASC) 10 MG tablet   atorvastatin (LIPITOR) 80 MG tablet   aspirin EC 81 MG tablet   Other Relevant Orders   Ambulatory referral to Vascular Surgery   PAD (peripheral artery disease) (HCC)   Needs to continue aspirin and statin, sent refills Needs to have urgent evaluation by vascular surgery for possible gangrene of left foot - referred to ER today      Relevant Medications   amLODipine (NORVASC) 10 MG tablet   atorvastatin (LIPITOR) 80 MG tablet   aspirin EC 81 MG tablet     Other   Tobacco abuse   Asked about quitting: confirms that he/she currently smokes cigarettes Advise to quit smoking: Educated about QUITTING to reduce the risk of cancer, cardio and cerebrovascular disease. Assess willingness: Unwilling to quit at this time, but is working on cutting back. Assist with counseling and pharmacotherapy: Counseled for 5 minutes. Arrange for follow up: follow up in 3 months and continue to offer help.      Other Visit Diagnoses       Colon cancer screening       Relevant Orders   Cologuard       Meds ordered this encounter  Medications   amLODipine (NORVASC) 10 MG tablet    Sig: Take 1 tablet (10 mg total) by mouth daily.    Dispense:  30 tablet    Refill:  5  atorvastatin (LIPITOR) 80 MG tablet    Sig: Take 1 tablet (80 mg total) by mouth at bedtime.    Dispense:  30 tablet    Refill:  5   aspirin EC 81 MG tablet    Sig: Take 1 tablet (81 mg total) by mouth daily. Swallow whole.    Dispense:  30 tablet    Refill:  12    Follow-up: Return in about 2 weeks (around 07/01/2023).    Anabel Halon, MD

## 2023-06-17 NOTE — Patient Instructions (Addendum)
Please start taking Amlodipine, Atorvastatin and Aspirin.  Please go to ER for evaluation of possible gangrene of toe.

## 2023-06-17 NOTE — Assessment & Plan Note (Signed)
BP: (!) 142/84   Uncontrolled due to noncompliance  Restart Amlodipine 10 mg QD Counseled for compliance with the medications Advised DASH diet and moderate exercise/walking as tolerated

## 2023-06-17 NOTE — ED Triage Notes (Signed)
Pt sent from PCP for evaluation of left pinky toe for possible gangrene. Pt states went to PCP -2-3 weeks ago and was placed on antibiotics and just finished them this week. Pt states pain is minimal and rates is 10/10.

## 2023-06-17 NOTE — Assessment & Plan Note (Signed)
S/p below below-knee pop bypass with right great toe amputation Was on aspirin and statin, needs to restart immediately Urgent referral sent to vascular surgery due to concern for possible gangrene Referred to ER for evaluation for possible gangrene

## 2023-06-17 NOTE — ED Provider Notes (Signed)
[place] EMERGENCY DEPARTMENT AT [facility] Provider Note   CSN: [id number] Arrival date & time: 06/17/23  1635     History  Chief Complaint  Patient presents with   Toe Pain    Paul Madden is a 68 y.o. male.   Toe Pain  Patient presents to the ED today complaining of a 2-week history of left-sided fifth toe pain.  Previous history of critical limb ischemia of right lower extremity with amputation of right great toe, PAD.  Was seen by PCP today and told to come to the ER for evaluation of ischemic limb.  Currently on clindamycin.  Reports having stopped taking aspirin 1 year ago.  Has not visited with podiatry or vascular surgery.  Has been referred to vascular surgery by PCP.  Denies weakness, numbness, tingling, foot pain.     Home Medications Prior to Admission medications   Medication Sig Start Date End Date Taking? Authorizing Provider  acetaminophen (TYLENOL 8 HOUR) 650 MG CR tablet Take 1 tablet (650 mg total) by mouth every 8 (eight) hours as needed for pain. 03/05/17   Derwood Kaplan, MD  amLODipine (NORVASC) 10 MG tablet Take 1 tablet (10 mg total) by mouth daily. 06/17/23   Anabel Halon, MD  aspirin EC 81 MG tablet Take 1 tablet (81 mg total) by mouth daily. Swallow whole. 06/17/23   Anabel Halon, MD  atorvastatin (LIPITOR) 80 MG tablet Take 1 tablet (80 mg total) by mouth at bedtime. 06/17/23   Anabel Halon, MD  clindamycin (CLEOCIN) 150 MG capsule Take 150 mg by mouth 4 (four) times daily. 06/09/23   [provider]  Iron, Ferrous Sulfate, 325 (65 Fe) MG TABS Take 325 mg by mouth daily. 11/02/21   Anabel Halon, MD  MULTIPLE VITAMINS PO Take by mouth.    [provider]  nicotine (NICODERM CQ - DOSED IN MG/24 HOURS) 21 mg/24hr patch Place 1 patch (21 mg total) onto the skin daily. 10/27/21   Rhetta Mura, MD  oxyCODONE-acetaminophen (PERCOCET/ROXICET) 5-325 MG tablet Take 1 tablet by mouth every 8 (eight) hours as needed  for moderate pain. 11/01/21   Anabel Halon, MD  polyethylene glycol powder (GLYCOLAX/MIRALAX) 17 GM/SCOOP powder Take 17 g by mouth daily. 10/27/21   Rhetta Mura, MD      Allergies    Patient has no known allergies.    Review of Systems   Review of Systems  Physical Exam Updated Vital Signs BP (!) 149/83 (BP Location: Left Arm)   Pulse 82   Temp 98.5 F (36.9 C) (Oral)   Resp 18   Ht 5\' 9"  (1.753 m)   Wt 54 kg   SpO2 96%   BMI 17.58 kg/m  Physical Exam  ED Results / Procedures / Treatments   Labs (all labs ordered are listed, but only abnormal results are displayed) Labs Reviewed - No data to display  EKG None  Radiology No results found.  Procedures Procedures   Medications Ordered in ED Medications  HYDROcodone-acetaminophen (NORCO/VICODIN) 5-325 MG per tablet 1 tablet (1 tablet Oral Given 06/17/23 1812)    ED Course/ Medical Decision Making/ A&P                                Medical Decision Making  This patient is a 68 year old male who presents to the ED for concern of left gangrenous fifth toe with concerns of acute  limb ischemia.   Differential diagnoses prior to evaluation: The emergent differential diagnosis includes, but is not limited to, limb ischemia, PAD, versus dry gangrene, necrotizing fasciitis. This is not an exhaustive differential.   Past Medical History / Co-morbidities / Social History: Current smoker. Previous medical history of chronic low back pain, critical limb ischemia both lower extremities, PAD.  Additional history: Chart reviewed. Pertinent results include: Was seen by PCP today and told to come to the ER for evaluation of ischemic limb.  Currently on clindamycin.  Reports having stopped taking aspirin 1 year ago.  Has not visited with podiatry or vascular surgery.  Has been referred to vascular surgery by PCP.  Lab Tests/Imaging studies: I personally interpreted labs/imaging and the pertinent results include:   Marland Kitchen X-rays pending.  ED Course:   Patient is a 68 year old male presents the ED today complaining of left gangrenous fifth toe with possible limb ischemia.  Previous medical history of PAD, status post right great toe amputation due to acute ischemia.  Doppler pulses were able to be found on the DP and posterior tibial pulses.  Unable to feel them by palpation.  Femoral pulse was palpable.  Patient was previously seen by PCP today and told to come to the ER.  However upon talking with patient and providing 1 dose of Norco for pain, patient then eloped.  Unable to evaluate further due to patient not being present at this time.  Was going to consult with vascular surgery due to previous vascular disorders.  He also needed a follow-up with podiatry immediately and antibiotics.  However was able to perform any of these or facilitate any of these due to patient eloping from the ER.    Disposition: Patient eloped without being evaluated further.   Final Clinical Impression(s) / ED Diagnoses Final diagnoses:  Eloped from emergency department    Rx / DC Orders ED Discharge Orders     None     Portions of this report may have been transcribed using voice recognition software. Every effort was made to ensure accuracy; however, inadvertent computerized transcription errors may be present.     Lunette Stands, [place] 06/17/23 1846    Terald Sleeper, MD 06/17/23 [phone]

## 2023-06-17 NOTE — Assessment & Plan Note (Signed)
Needs to continue aspirin and statin, sent refills Needs to have urgent evaluation by vascular surgery for possible gangrene of left foot - referred to ER today

## 2023-06-18 ENCOUNTER — Other Ambulatory Visit: Payer: Self-pay | Admitting: Internal Medicine

## 2023-06-18 ENCOUNTER — Telehealth: Payer: Self-pay

## 2023-06-18 ENCOUNTER — Ambulatory Visit: Payer: Self-pay | Admitting: Internal Medicine

## 2023-06-18 ENCOUNTER — Emergency Department (HOSPITAL_COMMUNITY): Payer: Medicare HMO

## 2023-06-18 ENCOUNTER — Encounter (HOSPITAL_COMMUNITY): Payer: Self-pay

## 2023-06-18 ENCOUNTER — Inpatient Hospital Stay (HOSPITAL_COMMUNITY)
Admission: EM | Admit: 2023-06-18 | Discharge: 2023-06-21 | DRG: 300 | Disposition: A | Discharge status: Home / Self Care | Payer: Medicare HMO | Attending: Internal Medicine | Admitting: Internal Medicine

## 2023-06-18 DIAGNOSIS — D649 Anemia, unspecified: Secondary | ICD-10-CM | POA: Diagnosis present

## 2023-06-18 DIAGNOSIS — Z89421 Acquired absence of other right toe(s): Secondary | ICD-10-CM | POA: Diagnosis not present

## 2023-06-18 DIAGNOSIS — I70221 Atherosclerosis of native arteries of extremities with rest pain, right leg: Secondary | ICD-10-CM | POA: Diagnosis present

## 2023-06-18 DIAGNOSIS — Z8249 Family history of ischemic heart disease and other diseases of the circulatory system: Secondary | ICD-10-CM

## 2023-06-18 DIAGNOSIS — D72819 Decreased white blood cell count, unspecified: Secondary | ICD-10-CM | POA: Diagnosis present

## 2023-06-18 DIAGNOSIS — Z79899 Other long term (current) drug therapy: Secondary | ICD-10-CM

## 2023-06-18 DIAGNOSIS — Z789 Other specified health status: Secondary | ICD-10-CM | POA: Diagnosis not present

## 2023-06-18 DIAGNOSIS — F1010 Alcohol abuse, uncomplicated: Secondary | ICD-10-CM | POA: Diagnosis not present

## 2023-06-18 DIAGNOSIS — I7090 Unspecified atherosclerosis: Secondary | ICD-10-CM | POA: Diagnosis not present

## 2023-06-18 DIAGNOSIS — I739 Peripheral vascular disease, unspecified: Secondary | ICD-10-CM | POA: Diagnosis present

## 2023-06-18 DIAGNOSIS — I701 Atherosclerosis of renal artery: Secondary | ICD-10-CM | POA: Diagnosis not present

## 2023-06-18 DIAGNOSIS — F17210 Nicotine dependence, cigarettes, uncomplicated: Secondary | ICD-10-CM | POA: Diagnosis present

## 2023-06-18 DIAGNOSIS — I10 Essential (primary) hypertension: Secondary | ICD-10-CM | POA: Diagnosis present

## 2023-06-18 DIAGNOSIS — Z95820 Peripheral vascular angioplasty status with implants and grafts: Secondary | ICD-10-CM

## 2023-06-18 DIAGNOSIS — Z89411 Acquired absence of right great toe: Secondary | ICD-10-CM

## 2023-06-18 DIAGNOSIS — F1410 Cocaine abuse, uncomplicated: Secondary | ICD-10-CM | POA: Diagnosis present

## 2023-06-18 DIAGNOSIS — R509 Fever, unspecified: Secondary | ICD-10-CM | POA: Diagnosis present

## 2023-06-18 DIAGNOSIS — I96 Gangrene, not elsewhere classified: Principal | ICD-10-CM

## 2023-06-18 DIAGNOSIS — K4090 Unilateral inguinal hernia, without obstruction or gangrene, not specified as recurrent: Secondary | ICD-10-CM | POA: Diagnosis not present

## 2023-06-18 DIAGNOSIS — I70262 Atherosclerosis of native arteries of extremities with gangrene, left leg: Principal | ICD-10-CM | POA: Diagnosis present

## 2023-06-18 DIAGNOSIS — M545 Low back pain, unspecified: Secondary | ICD-10-CM | POA: Diagnosis not present

## 2023-06-18 DIAGNOSIS — G609 Hereditary and idiopathic neuropathy, unspecified: Secondary | ICD-10-CM | POA: Diagnosis not present

## 2023-06-18 DIAGNOSIS — I998 Other disorder of circulatory system: Secondary | ICD-10-CM | POA: Diagnosis present

## 2023-06-18 DIAGNOSIS — I724 Aneurysm of artery of lower extremity: Secondary | ICD-10-CM | POA: Diagnosis not present

## 2023-06-18 DIAGNOSIS — G8929 Other chronic pain: Secondary | ICD-10-CM | POA: Diagnosis present

## 2023-06-18 DIAGNOSIS — Z7982 Long term (current) use of aspirin: Secondary | ICD-10-CM

## 2023-06-18 DIAGNOSIS — R000 Tachycardia, unspecified: Secondary | ICD-10-CM | POA: Diagnosis not present

## 2023-06-18 DIAGNOSIS — I70222 Atherosclerosis of native arteries of extremities with rest pain, left leg: Secondary | ICD-10-CM | POA: Diagnosis not present

## 2023-06-18 DIAGNOSIS — Z743 Need for continuous supervision: Secondary | ICD-10-CM | POA: Diagnosis not present

## 2023-06-18 DIAGNOSIS — M79606 Pain in leg, unspecified: Secondary | ICD-10-CM | POA: Diagnosis not present

## 2023-06-18 DIAGNOSIS — I70223 Atherosclerosis of native arteries of extremities with rest pain, bilateral legs: Secondary | ICD-10-CM

## 2023-06-18 DIAGNOSIS — I70269 Atherosclerosis of native arteries of extremities with gangrene, unspecified extremity: Secondary | ICD-10-CM | POA: Diagnosis not present

## 2023-06-18 DIAGNOSIS — I745 Embolism and thrombosis of iliac artery: Secondary | ICD-10-CM | POA: Diagnosis present

## 2023-06-18 DIAGNOSIS — I70245 Atherosclerosis of native arteries of left leg with ulceration of other part of foot: Secondary | ICD-10-CM | POA: Diagnosis not present

## 2023-06-18 DIAGNOSIS — M79675 Pain in left toe(s): Secondary | ICD-10-CM | POA: Diagnosis not present

## 2023-06-18 LAB — LACTIC ACID, PLASMA
Lactic Acid, Venous: 1.4 mmol/L (ref 0.5–1.9)
Lactic Acid, Venous: 1.9 mmol/L (ref 0.5–1.9)

## 2023-06-18 LAB — BASIC METABOLIC PANEL
Anion gap: 9 (ref 5–15)
BUN: 16 mg/dL (ref 8–23)
CO2: 25 mmol/L (ref 22–32)
Calcium: 8.3 mg/dL — ABNORMAL LOW (ref 8.9–10.3)
Chloride: 103 mmol/L (ref 98–111)
Creatinine, Ser: 1.12 mg/dL (ref 0.61–1.24)
GFR, Estimated: >60 mL/min (ref >60)
Glucose, Bld: 95 mg/dL (ref 70–99)
Potassium: 4.2 mmol/L (ref 3.5–5.1)
Sodium: 137 mmol/L (ref 135–145)

## 2023-06-18 LAB — CBC WITH DIFFERENTIAL/PLATELET
Abs Immature Granulocytes: 0.05 10*3/uL (ref 0.00–0.07)
Basophils Absolute: 0 10*3/uL (ref 0.0–0.1)
Basophils Relative: 1 %
Eosinophils Absolute: 0 10*3/uL (ref 0.0–0.5)
Eosinophils Relative: 1 %
HCT: 32.6 % — ABNORMAL LOW (ref 39.0–52.0)
Hemoglobin: 10.3 g/dL — ABNORMAL LOW (ref 13.0–17.0)
Immature Granulocytes: 4 %
Lymphocytes Relative: 48 %
Lymphs Abs: 0.7 10*3/uL (ref 0.7–4.0)
MCH: 27.6 pg (ref 26.0–34.0)
MCHC: 31.6 g/dL (ref 30.0–36.0)
MCV: 87.4 fL (ref 80.0–100.0)
Monocytes Absolute: 0.1 10*3/uL (ref 0.1–1.0)
Monocytes Relative: 7 %
Neutro Abs: 0.5 10*3/uL — ABNORMAL LOW (ref 1.7–7.7)
Neutrophils Relative %: 39 %
Platelets: 392 10*3/uL (ref 150–400)
RBC: 3.73 MIL/uL — ABNORMAL LOW (ref 4.22–5.81)
RDW: 14 % (ref 11.5–15.5)
WBC: 1.4 10*3/uL — CL (ref 4.0–10.5)
nRBC: 0 % (ref 0.0–0.2)

## 2023-06-18 MED ORDER — HEPARIN SODIUM (PORCINE) 5000 UNIT/ML IJ SOLN: 5000.0000 [IU] | Freq: Once | INTRAMUSCULAR | Status: DC

## 2023-06-18 MED ORDER — ACETAMINOPHEN 650 MG RE SUPP: 650.0000 mg | Freq: Four times a day (QID) | RECTAL | Status: DC | PRN

## 2023-06-18 MED ORDER — LORAZEPAM 2 MG/ML IJ SOLN: 1.0000 mg | INTRAMUSCULAR | Status: DC | PRN

## 2023-06-18 MED ORDER — MORPHINE SULFATE (PF) 2 MG/ML IV SOLN
2.0000 mg | INTRAVENOUS | Status: DC | PRN
  Administered 2023-06-19 – 2023-06-21 (×5): 2 mg via INTRAVENOUS
  Filled 2023-06-18 (×6): qty 1

## 2023-06-18 MED ORDER — ONDANSETRON HCL 4 MG PO TABS: 4.0000 mg | ORAL_TABLET | Freq: Four times a day (QID) | ORAL | Status: DC | PRN

## 2023-06-18 MED ORDER — IOHEXOL 350 MG/ML SOLN
100.0000 mL | Freq: Once | INTRAVENOUS | Status: AC | PRN
  Administered 2023-06-18: 100 mL via INTRAVENOUS

## 2023-06-18 MED ORDER — HYDROCODONE-ACETAMINOPHEN 5-325 MG PO TABS
1.0000 | ORAL_TABLET | Freq: Once | ORAL | Status: AC
  Administered 2023-06-18: 1 via ORAL
  Filled 2023-06-18: qty 1

## 2023-06-18 MED ORDER — FOLIC ACID 1 MG PO TABS
1.0000 mg | ORAL_TABLET | Freq: Every day | ORAL | Status: DC
  Administered 2023-06-19 – 2023-06-21 (×3): 1 mg via ORAL
  Filled 2023-06-18 (×3): qty 1

## 2023-06-18 MED ORDER — ONDANSETRON HCL 4 MG/2ML IJ SOLN: 4.0000 mg | Freq: Four times a day (QID) | INTRAMUSCULAR | Status: DC | PRN

## 2023-06-18 MED ORDER — LORAZEPAM 1 MG PO TABS: 1.0000 mg | ORAL_TABLET | ORAL | Status: DC | PRN

## 2023-06-18 MED ORDER — THIAMINE HCL 100 MG/ML IJ SOLN
100.0000 mg | Freq: Every day | INTRAMUSCULAR | Status: DC
  Filled 2023-06-18: qty 2

## 2023-06-18 MED ORDER — ACETAMINOPHEN 325 MG PO TABS
650.0000 mg | ORAL_TABLET | Freq: Four times a day (QID) | ORAL | Status: DC | PRN
  Administered 2023-06-18 – 2023-06-19 (×2): 650 mg via ORAL
  Filled 2023-06-18 (×3): qty 2

## 2023-06-18 MED ORDER — ADULT MULTIVITAMIN W/MINERALS CH
1.0000 | ORAL_TABLET | Freq: Every day | ORAL | Status: DC
  Administered 2023-06-19 – 2023-06-21 (×3): 1 via ORAL
  Filled 2023-06-18 (×3): qty 1

## 2023-06-18 MED ORDER — THIAMINE MONONITRATE 100 MG PO TABS
100.0000 mg | ORAL_TABLET | Freq: Every day | ORAL | Status: DC
  Administered 2023-06-19 – 2023-06-21 (×3): 100 mg via ORAL
  Filled 2023-06-18 (×3): qty 1

## 2023-06-18 MED ORDER — POLYETHYLENE GLYCOL 3350 17 G PO PACK: 17.0000 g | PACK | Freq: Every day | ORAL | Status: DC | PRN

## 2023-06-18 NOTE — Telephone Encounter (Signed)
Copied from CRM [id number]. Topic: Clinical - Medication Refill >> Jun 18, 2023  9:07 AM Geroge Baseman wrote: Most Recent Primary Care Visit:  Provider: Anabel Halon  Department: [facility]  Visit Type: ACUTE  Date: 06/17/2023  Medication: Iron, Ferrous Sulfate, 325 (65 Fe) MG TABS, acetaminophen (TYLENOL 8 HOUR) 650 MG CR tablet, MULTIPLE VITAMINS PO,   Has the patient contacted their pharmacy? Yes (Agent: If no, request that the patient contact the pharmacy for the refill. If patient does not wish to contact the pharmacy document the reason why and proceed with request.) (Agent: If yes, when and what did the pharmacy advise?)  Is this the correct pharmacy for this prescription?  If no, delete pharmacy and type the correct one.  This is the patient's preferred pharmacy:  [facility] [address], [place] - [address] [place] [address] [place] [address] Phone: [phone] Fax: [phone]  [facility] [address] [place] [address] Phone: [phone] Fax: [phone]  [facility] [address] [place]), [place] - [address] [place] [address] Phone: [phone] Fax: [phone]   Has the prescription been filled recently?   Is the patient out of the medication?   Has the patient been seen for an appointment in the last year OR does the patient have an upcoming appointment?   Can we respond through MyChart?   Agent: Please be advised that Rx refills may take up to 3 business days. We ask that you follow-up with your pharmacy.

## 2023-06-18 NOTE — ED Triage Notes (Addendum)
Pt reports his left fifth toe is painful and the nail has come off.  Pt denies any injury and states he is not diabetic. Pt was sent to [facility] ER yesterday by PCP for critical ischemia but pt left because he had waited for hours and they did not do anything.

## 2023-06-18 NOTE — Assessment & Plan Note (Signed)
Stable. -Resume Norvasc.

## 2023-06-18 NOTE — Assessment & Plan Note (Signed)
History of RLE CFE and Fem BK pop vein bypass, right great toe amp

## 2023-06-18 NOTE — Telephone Encounter (Signed)
Copied from CRM [id number]. Topic: Clinical - Pink Word Triage >> Jun 18, 2023  9:13 AM Geroge Baseman wrote: Reason for Triage: Still feeling sick after finishing antibiotics   Chief Complaint: antibiotic request Symptoms: toe pain Frequency: ongoing  Pertinent Negatives: Patient denies need for further assessment  Disposition: [x] ED /[] Urgent Care (no appt availability in office) / [] Appointment(In office/virtual)/ []  [place] [facility]/ [] Home Care/ [] Refused Recommended Disposition /[] [place] [facility]/ []  Follow-up with PCP Additional Notes: Returned call to patient regarding antibiotic refill.  It was the patient's emergency contact who called this morning on his behalf.  She stated that the patient is still in pain and finished his antibiotic but may need a refill. Briefly spoke to the patient who said "I'm fine, I just need my medicine."  Informed him that his amlodipine and Lipitor were sent to the pharmacy however, an antibiotic was not sent as his pcp recommended that the ED to further assess him.  Noted  that he left the ED sooner than expected and asked if he planned to go back to the ED and he said no.  The patient's emergency contact took the phone back stating that he just needs the antibiotic and pain medicine. Advised that the patient return to the ED for a full assessment.  She stated that she is not going back out in the cold.  Routed to pcp. Reason for Disposition  [1] Caller has URGENT medicine question about med that PCP or specialist prescribed AND [2] triager unable to answer question  Protocols used: Medication Question Call-A-AH

## 2023-06-18 NOTE — Telephone Encounter (Signed)
Copied from CRM [id number]. Topic: Clinical - Medical Advice >> Jun 18, 2023 12:45 PM Carloyn Manner C wrote: Reason for CRM: Jonathon Resides called to advise that the Patient has just been picked up by the ambulance and is being rushed to the [facility]. She can be reached at [phone]. Thank you.

## 2023-06-18 NOTE — Assessment & Plan Note (Addendum)
History of peripheral artery disease.  Left fifth toe discoloration and intermittent pain.  He has peripheral neuropathy.  No history of diabetes.  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 There is distal reconstitution of the left SFA and popliteal artery via collaterals from the profundus femoral system ( pls see detailed report). - EDP talked to vasc surgeon - Dr. Randie Heinz, recommend: Admission to [facility] where surgery can consult on patient but given chronic nature of the issue and reconstitution of flow on CT no emergent intervention is needed.  -N.p.o. midnight -IV morphine 2 mg as needed - HgbA1c - UDS -WBC 1.4, no signs of infection at this time, hold off on antibiotics

## 2023-06-18 NOTE — H&P (Signed)
History and Physical    Paul Madden GNF:[id number] DOB: [birth year] DOA: 06/18/2023  PCP: Anabel Halon, MD   Patient coming from: Home  I have personally briefly reviewed patient's old medical records in [facility]  Chief Complaint: Left fifth toe pain and discoloration  HPI: Paul Madden is a 68 y.o. male with medical history significant for hypertension, peripheral artery disease, alcohol abuse, substance abuse. Patient presented to the ED with complaints of intermittent pain, dark discoloration of his left fifth toe.  No fevers no chills. History of amputation of right big toe secondary to peripheral artery disease.  He saw his PCP yesterday, and was referred to the ED for critical limb ischemia, presented to the ED, but left before workup was completed.  Today he called his outpatient provider requesting antibiotics, he was again referred to the ED.  ED Course: Tmax 99.  Stable vitals.  WBC down to 1.4. CT angio AO + BIFEM- 5 cm segmental occlusion of the distal left SFA, new since prior study. There is distal reconstitution of the left SFA and popliteal artery via collaterals from the profundus femoral system. (Pls see detailed report) -EDP talked to vascular surgery, recommended admission to [facility]- Dr. Randie Heinz, : Admission to [facility] where surgery can consult on patient but given chronic nature of the issue and reconstitution of flow on CT no emergent intervention is needed.   Review of Systems: As per HPI all other systems reviewed and negative.  Past Medical History:  Diagnosis Date   Chronic low back pain    Hypertension     Past Surgical History:  Procedure Laterality Date   AMPUTATION TOE Right 10/24/2021   Procedure: AMPUTATION OF THE RIGHT GREAT TOE;  Surgeon: Maeola Harman, MD;  Location: [facility] OR;  Service: Vascular;  Laterality: Right;   FEMORAL-POPLITEAL BYPASS GRAFT Right 10/24/2021   Procedure: RIGHT BYPASS GRAFT COMMON FEMORAL-POPLITEAL ARTERY;   Surgeon: Maeola Harman, MD;  Location: [facility] OR;  Service: Vascular;  Laterality: Right;     reports that he has been smoking. He has never used smokeless tobacco. He reports current alcohol use. He reports that he does not use drugs.  No Known Allergies  Family history of hypertension.  Prior to Admission medications   Medication Sig Start Date End Date Taking? Authorizing Provider  acetaminophen (TYLENOL 8 HOUR) 650 MG CR tablet Take 1 tablet (650 mg total) by mouth every 8 (eight) hours as needed for pain. 03/05/17   Derwood Kaplan, MD  amLODipine (NORVASC) 10 MG tablet Take 1 tablet (10 mg total) by mouth daily. 06/17/23   Anabel Halon, MD  aspirin EC 81 MG tablet Take 1 tablet (81 mg total) by mouth daily. Swallow whole. 06/17/23   Anabel Halon, MD  atorvastatin (LIPITOR) 80 MG tablet Take 1 tablet (80 mg total) by mouth at bedtime. 06/17/23   Anabel Halon, MD  clindamycin (CLEOCIN) 150 MG capsule Take 150 mg by mouth 4 (four) times daily. 06/09/23   [provider]  Ferrous Sulfate (IRON) 325 (65 Fe) MG TABS Take 1 tablet by mouth once daily 06/18/23   Anabel Halon, MD  MULTIPLE VITAMINS PO Take by mouth.    [provider]  nicotine (NICODERM CQ - DOSED IN MG/24 HOURS) 21 mg/24hr patch Place 1 patch (21 mg total) onto the skin daily. 10/27/21   Rhetta Mura, MD  oxyCODONE-acetaminophen (PERCOCET/ROXICET) 5-325 MG tablet Take 1 tablet by mouth every 8 (eight) hours  as needed for moderate pain. Patient not taking: Reported on 06/18/2023 11/01/21   Anabel Halon, MD  polyethylene glycol powder (GLYCOLAX/MIRALAX) 17 GM/SCOOP powder Take 17 g by mouth daily. 10/27/21   Rhetta Mura, MD    Physical Exam: Vitals:   06/18/23 1551 06/18/23 1825 06/18/23 2000 06/18/23 2213  BP: 118/81 117/64 139/78 136/79  Pulse: 87 63 68 69  Resp: 17 18 16 18   Temp: 99.7 F (37.6 C)   98.1 F (36.7 C)  TempSrc: Oral   Oral  SpO2: 97% 99%  99%  Weight:       Height:        Constitutional: NAD, calm, comfortable Vitals:   06/18/23 1551 06/18/23 1825 06/18/23 2000 06/18/23 2213  BP: 118/81 117/64 139/78 136/79  Pulse: 87 63 68 69  Resp: 17 18 16 18   Temp: 99.7 F (37.6 C)   98.1 F (36.7 C)  TempSrc: Oral   Oral  SpO2: 97% 99%  99%  Weight:      Height:       Eyes: PERRL, lids and conjunctivae normal ENMT: Mucous membranes are moist.  Neck: normal, supple, no masses, no thyromegaly Respiratory: clear to auscultation bilaterally, no wheezing, no crackles. Normal respiratory effort. No accessory muscle use.  Cardiovascular: Regular rate and rhythm, no murmurs / rubs / gallops. No extremity edema. Abdomen: no tenderness, no masses palpated. No hepatosplenomegaly. Bowel sounds positive.  Musculoskeletal: no clubbing / cyanosis. No joint deformity upper and lower extremities. Skin: no rashes, lesions, ulcers. No induration Neurologic: No facial asymmetry moving extremities spontaneously, speech fluent.  Psychiatric: Normal judgment and insight. Alert and oriented x 3. Normal mood.     Labs on Admission: I have personally reviewed following labs and imaging studies  CBC: Recent Labs  Lab 06/18/23 1555  WBC 1.4*  NEUTROABS 0.5*  HGB 10.3*  HCT 32.6*  MCV 87.4  PLT 392   Basic Metabolic Panel: Recent Labs  Lab 06/18/23 1555  NA 137  K 4.2  CL 103  CO2 25  GLUCOSE 95  BUN 16  CREATININE 1.12  CALCIUM 8.3*    Radiological Exams on Admission: CT Angio Aortobifemoral W and/or Wo Contrast Result Date: 06/18/2023 CLINICAL DATA:  Left fifth toe pain, loss of nail EXAM: CT ANGIOGRAPHY OF ABDOMINAL AORTA WITH ILIOFEMORAL RUNOFF TECHNIQUE: Multidetector CT imaging of the abdomen, pelvis and lower extremities was performed using the standard protocol during bolus administration of intravenous contrast. Multiplanar CT image reconstructions and MIPs were obtained to evaluate the vascular anatomy. RADIATION DOSE REDUCTION: This exam  was performed according to the departmental dose-optimization program which includes automated exposure control, adjustment of the mA and/or kV according to patient size and/or use of iterative reconstruction technique. CONTRAST:  OMNIPAQUE IOHEXOL 350 MG/ML SOLN COMPARISON:  10/20/2021 FINDINGS: VASCULAR Aorta: Normal caliber aorta without aneurysm, dissection, vasculitis or significant stenosis. Moderate atherosclerosis. Celiac: Stable mild stenosis at the origin of the celiac artery. No aneurysm, dissection, or vasculitis. SMA: Patent without evidence of aneurysm, dissection, vasculitis or significant stenosis. Mild atheromatous plaque at the origin of the SMA unchanged. Renals: There are 2 right-sided renal arteries, again with high-grade stenosis of the more inferior right renal artery. There is a left pelvic kidney, with 2 left renal arteries identified. The more superior artery arises from the ventral margin of the distal aorta and the more inferior artery arises from the right common iliac artery. Stable atherosclerosis without high-grade stenosis. No evidence of aneurysm, dissection, vasculitis, or  fibromuscular dysplasia. IMA: Patent without evidence of aneurysm, dissection, vasculitis or significant stenosis. RIGHT Lower Extremity Inflow: Chronic atherosclerosis of the right common iliac and external iliac arteries without evidence of high-grade stenosis. 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 No evidence of aneurysm or dissection. Outflow: Interval right femoral-popliteal bypass graft. 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 The bypass graft appears patent, though the distal aspect is not well evaluated due to imaging out pacing the contrast bolus. There is complete occlusion of the native right superficial femoral artery. The native right profundus femoral artery is patent. Runoff: Trifurcation  vessels cannot be evaluated due to timing of the contrast bolus, with the scanner out pacing the bolus beyond the level of the distal thigh. LEFT Lower Extremity Inflow: Chronic atherosclerosis of the left common iliac and internal iliac arteries without high-grade stenosis. 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 No evidence of aneurysm or dissection. Outflow: There is reconstitution of the left common femoral artery at the level of the bifurcation via collaterals from the left internal iliac. The left superficial femoral and profundus femoral arteries are mildly decreasing caliber, bladder widely patent within their proximal and mid extends. There is segmental occlusion of the left SFA as it exits Hunter's canal, new since prior study, extending approximately 5 cm in length reference image 89/8. There is reconstitution of the distal left SFA and popliteal arteries via profundus femoral collaterals. No evidence of aneurysm or dissection. Runoff: The left trifurcation vessels are diminutive but patent through the level of the ankle. Veins: No obvious venous abnormality within the limitations of this arterial phase study. Review of the MIP images confirms the above findings. NON-VASCULAR Lower chest: Areas of subpleural linear consolidation within the anterior left lower lobe and right middle lobe likely reflect subsegmental atelectasis. There is nodular subpleural consolidation within the medial left lower lobe measuring 8 mm reference image 16/4, indeterminate. Hepatobiliary: No focal liver abnormality is seen. No gallstones, gallbladder wall thickening, or biliary dilatation. Pancreas: Unremarkable. No pancreatic ductal dilatation or surrounding inflammatory changes. Spleen: Normal in size without focal abnormality. Adrenals/Urinary Tract: Left pelvic kidney. Mild bilateral renal cortical thinning. No urinary tract calculi or obstructive uropathy within either kidney. Stable appearance of the  adrenal glands. Mild bladder wall thickening is likely due to under distension. Stomach/Bowel: No bowel obstruction no bowel obstruction or ileus. Right inguinal hernia contains multiple loops of bowel without evidence of incarceration or obstruction. No bowel wall thickening or inflammatory change. Lymphatic: No pathologic adenopathy within the abdomen or pelvis. Reproductive: Stable appearance of the prostate. Other: No free fluid or free intraperitoneal gas. Right inguinal hernia containing multiple loops of small bowel, stable. No incarceration or obstruction. Musculoskeletal: There are no acute displaced fractures. Evidence of prior amputation of the first digit at the level of the metatarsophalangeal joint. No CT evidence of osteomyelitis within the area of concern at the left fifth digit. Reconstructed images demonstrate no additional findings. IMPRESSION: VASCULAR 1. 5 cm segmental occlusion of the distal left SFA, new since prior study. There is distal reconstitution of the left SFA and popliteal artery via collaterals from the profundus femoral system. The left popliteal artery and trifurcation vessels are diminutive, but patent through the level of the ankle. 2. 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 Imaging out paces the contrast bolus beyond the  mid right thigh, limiting evaluation for patency of the distal aspect of the bypass graft and right lower extremity runoff vessels. 3. 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 4. 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 5.  Aortic Atherosclerosis (ICD10-I70.0). NON-VASCULAR 1. Areas of subpleural consolidation within the lung bases, with slightly nodular appearance in the medial left lower lobe,  favor subsegmental atelectasis. Follow-up may be warranted to exclude pulmonary nodule. 2. Stable right inguinal hernia containing multiple loops of distal small bowel. No incarceration or obstruction. 3. Left pelvic kidney. Electronically Signed   By: Sharlet Salina M.D.   On: 06/18/2023 17:03    EKG: Independently reviewed. Pending  Assessment/Plan Principal Problem:   Ischemia of toe Active Problems:   Alcohol use   Essential hypertension   Idiopathic peripheral neuropathy   PAD (peripheral artery disease) (HCC)  Assessment and Plan: * Ischemia of toe History of peripheral artery disease.  Left fifth toe discoloration and intermittent pain.  He has peripheral neuropathy.  No history of diabetes.  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 There is distal reconstitution of the left SFA and popliteal artery via collaterals from the profundus femoral system ( pls see detailed report). - EDP talked to vasc surgeon - Dr. Randie Heinz, recommend: Admission to [facility] where surgery can consult on patient but given chronic nature of the issue and reconstitution of flow on CT no emergent intervention is needed.  -N.p.o. midnight -IV morphine 2 mg as needed - HgbA1c - UDS -WBC 1.4, no signs of infection at this time, hold off on antibiotics  Alcohol use Tells me he drinks at least 1-2 beers daily, not specify how much exactly.  Last drink was yesterday.  At this time is calm without signs of withdrawal. -CIWA as needed  - Thiamine, folate, multivitamins -Check magnesium and Phos  PAD (peripheral artery disease) (HCC) History of RLE CFE and Fem BK pop vein bypass, right great toe amp   Essential hypertension Stable. -Resume Norvasc  Leukopenia-WBC 1.4, baseline about 6.  In the setting of ischemic toe.   DVT prophylaxis: Heparin x 1, SCDs pending vascular surgery eval in a.m. Code Status: Full code Family Communication: None at  bedside Disposition Plan: ~/> 2 days Consults called: Vasc Surgery Admission status: Inpt Med surg I certify that at the point of admission it is my clinical judgment that the patient will require inpatient hospital care spanning beyond 2 midnights from the point of admission due to high intensity of service, high risk for further deterioration and high frequency of surveillance required.    Author: Onnie Boer, MD 06/18/2023 10:52 PM  For on call review [email or web address].

## 2023-06-18 NOTE — ED Notes (Signed)
Pt will not leave cardiac monitor on him.

## 2023-06-18 NOTE — ED Notes (Signed)
Pt given sandwich bag per EDP approval.

## 2023-06-18 NOTE — ED Provider Notes (Signed)
Paul Madden EMERGENCY DEPARTMENT AT [facility] Provider Note   CSN: [id number] Arrival date & time: 06/18/23  1247     History  Chief Complaint  Patient presents with   Toe Pain    Paul Madden is a 68 y.o. male.  He has PMH of PAD, hypertension, substance abuse.  Presents ER for left little toe discoloration and pain.  He was seen by his PCP for this yesterday and referred to ED for critical limb ischemia due to not feeling pulses.  He had dopplerable pulses in the ER, level was pending workup with plan to schedule surgery but patient unfortunately had eloped.  Comes back to the ER today after being advised by his PCP that he needed to be evaluated.  Reports this is been going on for couple of weeks.  Had right below knee popliteal bypass with right great toe amputation in May 2023 due to critical limb ischemia and osteomyelitis of the right great toe.  He states he was told at that time he had peripheral vascular disease in the left leg as well and would probably eventually need procedure.  Reports the past 30 days he has had discoloration pain to the little toe.     Toe Pain       Home Medications Prior to Admission medications   Medication Sig Start Date End Date Taking? Authorizing Provider  acetaminophen (TYLENOL 8 HOUR) 650 MG CR tablet Take 1 tablet (650 mg total) by mouth every 8 (eight) hours as needed for pain. 03/05/17   Derwood Kaplan, MD  amLODipine (NORVASC) 10 MG tablet Take 1 tablet (10 mg total) by mouth daily. 06/17/23   Anabel Halon, MD  aspirin EC 81 MG tablet Take 1 tablet (81 mg total) by mouth daily. Swallow whole. 06/17/23   Anabel Halon, MD  atorvastatin (LIPITOR) 80 MG tablet Take 1 tablet (80 mg total) by mouth at bedtime. 06/17/23   Anabel Halon, MD  clindamycin (CLEOCIN) 150 MG capsule Take 150 mg by mouth 4 (four) times daily. 06/09/23   [provider]  Ferrous Sulfate (IRON) 325 (65 Fe) MG TABS Take 1 tablet by mouth once  daily 06/18/23   Anabel Halon, MD  MULTIPLE VITAMINS PO Take by mouth.    [provider]  nicotine (NICODERM CQ - DOSED IN MG/24 HOURS) 21 mg/24hr patch Place 1 patch (21 mg total) onto the skin daily. 10/27/21   Rhetta Mura, MD  oxyCODONE-acetaminophen (PERCOCET/ROXICET) 5-325 MG tablet Take 1 tablet by mouth every 8 (eight) hours as needed for moderate pain. Patient not taking: Reported on 06/18/2023 11/01/21   Anabel Halon, MD  polyethylene glycol powder (GLYCOLAX/MIRALAX) 17 GM/SCOOP powder Take 17 g by mouth daily. 10/27/21   Rhetta Mura, MD      Allergies    Patient has no known allergies.    Review of Systems   Review of Systems  Physical Exam Updated Vital Signs BP 117/64   Pulse 63   Temp 99.7 F (37.6 C) (Oral)   Resp 18   Ht 5\' 9"  (1.753 m)   Wt 54 kg   SpO2 99%   BMI 17.58 kg/m  Physical Exam Vitals and nursing note reviewed.  Constitutional:      General: He is not in acute distress.    Appearance: He is well-developed.  HENT:     Head: Normocephalic and atraumatic.     Mouth/Throat:     Mouth: Mucous membranes are moist.  Eyes:     Extraocular Movements: Extraocular movements intact.     Conjunctiva/sclera: Conjunctivae normal.     Pupils: Pupils are equal, round, and reactive to light.  Cardiovascular:     Rate and Rhythm: Normal rate and regular rhythm.     Heart sounds: No murmur heard. Pulmonary:     Effort: Pulmonary effort is normal. No respiratory distress.     Breath sounds: Normal breath sounds.  Abdominal:     Palpations: Abdomen is soft.     Tenderness: There is no abdominal tenderness.  Musculoskeletal:        General: No swelling.     Cervical back: Neck supple.     Comments: DP pulse is not palpable in left foot but is able to be detected by Doppler  Skin:    General: Skin is warm and dry.     Capillary Refill: Capillary refill takes less than 2 seconds.  Neurological:     General: No focal deficit present.      Mental Status: He is alert and oriented to person, place, and time.  Psychiatric:        Mood and Affect: Mood normal.     ED Results / Procedures / Treatments   Labs (all labs ordered are listed, but only abnormal results are displayed) Labs Reviewed  BASIC METABOLIC PANEL - Abnormal; Notable for the following components:      Result Value   Calcium 8.3 (*)    All other components within normal limits  CBC WITH DIFFERENTIAL/PLATELET - Abnormal; Notable for the following components:   WBC 1.4 (*)    RBC 3.73 (*)    Hemoglobin 10.3 (*)    HCT 32.6 (*)    Neutro Abs 0.5 (*)    All other components within normal limits  CULTURE, BLOOD (ROUTINE X 2)  CULTURE, BLOOD (ROUTINE X 2)  LACTIC ACID, PLASMA  LACTIC ACID, PLASMA    EKG None  Radiology CT Angio Aortobifemoral W and/or Wo Contrast Result Date: 06/18/2023 CLINICAL DATA:  Left fifth toe pain, loss of nail EXAM: CT ANGIOGRAPHY OF ABDOMINAL AORTA WITH ILIOFEMORAL RUNOFF TECHNIQUE: Multidetector CT imaging of the abdomen, pelvis and lower extremities was performed using the standard protocol during bolus administration of intravenous contrast. Multiplanar CT image reconstructions and MIPs were obtained to evaluate the vascular anatomy. RADIATION DOSE REDUCTION: This exam was performed according to the departmental dose-optimization program which includes automated exposure control, adjustment of the mA and/or kV according to patient size and/or use of iterative reconstruction technique. CONTRAST:  OMNIPAQUE IOHEXOL 350 MG/ML SOLN COMPARISON:  10/20/2021 FINDINGS: VASCULAR Aorta: Normal caliber aorta without aneurysm, dissection, vasculitis or significant stenosis. Moderate atherosclerosis. Celiac: Stable mild stenosis at the origin of the celiac artery. No aneurysm, dissection, or vasculitis. SMA: Patent without evidence of aneurysm, dissection, vasculitis or significant stenosis. Mild atheromatous plaque at the origin of the  SMA unchanged. Renals: There are 2 right-sided renal arteries, again with high-grade stenosis of the more inferior right renal artery. There is a left pelvic kidney, with 2 left renal arteries identified. The more superior artery arises from the ventral margin of the distal aorta and the more inferior artery arises from the right common iliac artery. Stable atherosclerosis without high-grade stenosis. No evidence of aneurysm, dissection, vasculitis, or fibromuscular dysplasia. IMA: Patent without evidence of aneurysm, dissection, vasculitis or significant stenosis. RIGHT Lower Extremity Inflow: Chronic atherosclerosis of the right common iliac and external iliac arteries without evidence of high-grade stenosis.  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 No evidence of aneurysm or dissection. Outflow: Interval right femoral-popliteal bypass graft. 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 The bypass graft appears patent, though the distal aspect is not well evaluated due to imaging out pacing the contrast bolus. There is complete occlusion of the native right superficial femoral artery. The native right profundus femoral artery is patent. Runoff: Trifurcation vessels cannot be evaluated due to timing of the contrast bolus, with the scanner out pacing the bolus beyond the level of the distal thigh. LEFT Lower Extremity Inflow: Chronic atherosclerosis of the left common iliac and internal iliac arteries without high-grade stenosis. 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 No evidence of aneurysm or dissection. Outflow: There is reconstitution of the left common femoral artery at the level of the bifurcation via collaterals from the left internal iliac. The left superficial femoral and profundus femoral arteries are mildly decreasing caliber, bladder widely patent within their proximal and mid extends. There  is segmental occlusion of the left SFA as it exits Hunter's canal, new since prior study, extending approximately 5 cm in length reference image 89/8. There is reconstitution of the distal left SFA and popliteal arteries via profundus femoral collaterals. No evidence of aneurysm or dissection. Runoff: The left trifurcation vessels are diminutive but patent through the level of the ankle. Veins: No obvious venous abnormality within the limitations of this arterial phase study. Review of the MIP images confirms the above findings. NON-VASCULAR Lower chest: Areas of subpleural linear consolidation within the anterior left lower lobe and right middle lobe likely reflect subsegmental atelectasis. There is nodular subpleural consolidation within the medial left lower lobe measuring 8 mm reference image 16/4, indeterminate. Hepatobiliary: No focal liver abnormality is seen. No gallstones, gallbladder wall thickening, or biliary dilatation. Pancreas: Unremarkable. No pancreatic ductal dilatation or surrounding inflammatory changes. Spleen: Normal in size without focal abnormality. Adrenals/Urinary Tract: Left pelvic kidney. Mild bilateral renal cortical thinning. No urinary tract calculi or obstructive uropathy within either kidney. Stable appearance of the adrenal glands. Mild bladder wall thickening is likely due to under distension. Stomach/Bowel: No bowel obstruction no bowel obstruction or ileus. Right inguinal hernia contains multiple loops of bowel without evidence of incarceration or obstruction. No bowel wall thickening or inflammatory change. Lymphatic: No pathologic adenopathy within the abdomen or pelvis. Reproductive: Stable appearance of the prostate. Other: No free fluid or free intraperitoneal gas. Right inguinal hernia containing multiple loops of small bowel, stable. No incarceration or obstruction. Musculoskeletal: There are no acute displaced fractures. Evidence of prior amputation of the first digit at  the level of the metatarsophalangeal joint. No CT evidence of osteomyelitis within the area of concern at the left fifth digit. Reconstructed images demonstrate no additional findings. IMPRESSION: VASCULAR 1. 5 cm segmental occlusion of the distal left SFA, new since prior study. There is distal reconstitution of the left SFA and popliteal artery via collaterals from the profundus femoral system. The left popliteal artery and trifurcation vessels are diminutive, but patent through the level of the ankle. 2. 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 Imaging out paces the contrast bolus beyond the mid right thigh, limiting evaluation for patency of the distal aspect of the bypass graft and right lower extremity runoff vessels. 3. 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 4. 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 5.  Aortic Atherosclerosis (ICD10-I70.0). NON-VASCULAR 1. Areas of subpleural consolidation within the lung bases, with slightly nodular appearance in the medial left lower lobe, favor subsegmental atelectasis. Follow-up may be warranted to exclude pulmonary nodule. 2. Stable right inguinal hernia containing multiple loops of distal small bowel. No incarceration or obstruction. 3. Left pelvic kidney. Electronically Signed   By: Sharlet Salina M.D.   On: 06/18/2023 17:03    Procedures Procedures    Medications Ordered in ED Medications  HYDROcodone-acetaminophen (NORCO/VICODIN) 5-325 MG per tablet 1 tablet (1 tablet Oral Given 06/18/23 1608)  iohexol (OMNIPAQUE) 350 MG/ML injection 100 mL (100 mLs Intravenous Contrast Given 06/18/23 1623)    ED Course/ Medical Decision Making/ A&P                                 Medical Decision  Making This patient presents to the ED for concern of left little toe pain and discoloration x 1 month, this involves an extensive number of treatment options, and is a complaint that carries with it a high risk of complications and morbidity.  The differential diagnosis includes chronic ischemia, acute ischemia, wound infection, sepsis, other   Co morbidities that complicate the patient evaluation :   Peripheral arterial disease, substance abuse   Additional history obtained:  Additional history obtained from EMR External records from outside source obtained and reviewed including prior labs, notes, procedure notes from right leg bypass   Lab Tests:  I Ordered, and personally interpreted labs.  The pertinent results include: Patient's CBC is abnormal with white blood cell count 1.4, giant platelets seen and 4% immature granulocytes, BMP is normal, lactic acid 1.4   Imaging Studies ordered:  I ordered imaging studies including CT angio aortobifemoral with runoff which shows 5 cm segmental occlusion of distal left SFA new since prior study with distal reconstitution of left SFA and popliteal artery via collaterals, interval right femoral-popliteal artery bypass with 1.7 cm aneurysm at anastomosis with right common femoral artery, chronic occlusion of left external iliac artery with reconstitution at level of common femoral artery via collaterals from internal iliac system, chronic occlusion of right internal iliac artery and native right superficial femoral artery     Consultations Obtained:  I requested consultation with the on-call vascular surgeon, Dr. Randie Heinz,  and discussed lab and imaging findings as well as pertinent plan - they recommend: Admission to [facility] where surgery can consult on patient but given chronic nature of the issue and reconstitution of flow on CT no emergent intervention is needed.  Is a consult with Dr. Idelle Leech, regarding patient's low white blood cell count,  will need to consult oncology at [facility] with hospitalist Dr. 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   I ordered medication including norco  for pain  Reevaluation of the patient after  these medicines showed that the patient improved I have reviewed the patients home medicines and have made adjustments as needed   Social Determinants of Health:  Toabacco use      Amount and/or Complexity of Data Reviewed Labs: ordered. Radiology: ordered.  Risk Prescription drug management. Decision regarding hospitalization.           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

## 2023-06-18 NOTE — Telephone Encounter (Signed)
Duplicate request. Medication request already routed to office earlier this morning.

## 2023-06-18 NOTE — ED Notes (Signed)
Pt demanding juice. Pt given juice at this time.

## 2023-06-18 NOTE — Assessment & Plan Note (Signed)
Tells me he drinks at least 1-2 beers daily, not specify how much exactly.  Last drink was yesterday.  At this time is calm without signs of withdrawal. -CIWA as needed  - Thiamine, folate, multivitamins -Check magnesium and Phos

## 2023-06-18 NOTE — Telephone Encounter (Signed)
Copied from CRM [id number]. Topic: Clinical - Medication Refill >> Jun 18, 2023  9:07 AM Geroge Baseman wrote: Most Recent Primary Care Visit:  Provider: Anabel Halon  Department: [facility]  Visit Type: ACUTE  Date: 06/17/2023  Medication: Iron, Ferrous Sulfate, 325 (65 Fe) MG TABS, acetaminophen (TYLENOL 8 HOUR) 650 MG CR tablet, MULTIPLE VITAMINS PO, oxyCODONE-acetaminophen (PERCOCET/ROXICET) 5-325 MG tablet, polyethylene glycol powder (GLYCOLAX/MIRALAX) 17 GM/SCOOP powder  Has the patient contacted their pharmacy? Yes Pharmacy states the patient needs RX sent over to them  Is this the correct pharmacy for this prescription? Yes If no, delete pharmacy and type the correct one.  This is the patient's preferred pharmacy:   [facility] [address] [place] - [address] [place] [address] [place] [address] Phone: [phone] Fax: [phone]   Has the prescription been filled recently? No  Is the patient out of the medication? Yes, completely  Has the patient been seen for an appointment in the last year OR does the patient have an upcoming appointment? Yes  Can we respond through MyChart? Please call on the phone.  Agent: Please be advised that Rx refills may take up to 3 business days. We ask that you follow-up with your pharmacy.

## 2023-06-19 ENCOUNTER — Inpatient Hospital Stay (HOSPITAL_COMMUNITY): Payer: Medicare HMO

## 2023-06-19 DIAGNOSIS — I70245 Atherosclerosis of native arteries of left leg with ulceration of other part of foot: Secondary | ICD-10-CM | POA: Diagnosis not present

## 2023-06-19 DIAGNOSIS — I7090 Unspecified atherosclerosis: Secondary | ICD-10-CM

## 2023-06-19 DIAGNOSIS — I998 Other disorder of circulatory system: Secondary | ICD-10-CM | POA: Diagnosis not present

## 2023-06-19 LAB — HEMOGLOBIN A1C
Hgb A1c MFr Bld: 5.2 % (ref 4.8–5.6)
Mean Plasma Glucose: 102.54 mg/dL

## 2023-06-19 LAB — RAPID URINE DRUG SCREEN, HOSP PERFORMED
Amphetamines: NOT DETECTED
Barbiturates: NOT DETECTED
Benzodiazepines: NOT DETECTED
Cocaine: POSITIVE — AB
Opiates: POSITIVE — AB
Tetrahydrocannabinol: NOT DETECTED

## 2023-06-19 LAB — BASIC METABOLIC PANEL
Anion gap: 3 — ABNORMAL LOW (ref 5–15)
BUN: 15 mg/dL (ref 8–23)
CO2: 29 mmol/L (ref 22–32)
Calcium: 8.5 mg/dL — ABNORMAL LOW (ref 8.9–10.3)
Chloride: 106 mmol/L (ref 98–111)
Creatinine, Ser: 1.11 mg/dL (ref 0.61–1.24)
GFR, Estimated: >60 mL/min (ref >60)
Glucose, Bld: 97 mg/dL (ref 70–99)
Potassium: 4.6 mmol/L (ref 3.5–5.1)
Sodium: 138 mmol/L (ref 135–145)

## 2023-06-19 LAB — HIV ANTIBODY (ROUTINE TESTING W REFLEX): HIV Screen 4th Generation wRfx: NONREACTIVE

## 2023-06-19 LAB — PHOSPHORUS: Phosphorus: 4.1 mg/dL (ref 2.5–4.6)

## 2023-06-19 LAB — CBC
HCT: 30.8 % — ABNORMAL LOW (ref 39.0–52.0)
Hemoglobin: 9.7 g/dL — ABNORMAL LOW (ref 13.0–17.0)
MCH: 27.1 pg (ref 26.0–34.0)
MCHC: 31.5 g/dL (ref 30.0–36.0)
MCV: 86 fL (ref 80.0–100.0)
Platelets: 374 10*3/uL (ref 150–400)
RBC: 3.58 MIL/uL — ABNORMAL LOW (ref 4.22–5.81)
RDW: 13.8 % (ref 11.5–15.5)
WBC: 1.6 10*3/uL — ABNORMAL LOW (ref 4.0–10.5)
nRBC: 0 % (ref 0.0–0.2)

## 2023-06-19 LAB — VAS US ABI WITH/WO TBI
Left ABI: 0.51
Right ABI: 1.02

## 2023-06-19 LAB — MAGNESIUM: Magnesium: 2.1 mg/dL (ref 1.7–2.4)

## 2023-06-19 MED ORDER — AMLODIPINE BESYLATE 10 MG PO TABS
10.0000 mg | ORAL_TABLET | Freq: Every day | ORAL | Status: DC
  Administered 2023-06-19 – 2023-06-21 (×3): 10 mg via ORAL
  Filled 2023-06-19 (×3): qty 1

## 2023-06-19 NOTE — TOC Initial Note (Signed)
Transition of Care [facility]) - Initial/Assessment Note    Patient Details  Name: Paul Madden MRN: [id number] Date of Birth: November 27, 1955  [facility] CM/SW Contact:    Marliss Coots, LCSW Phone Number: 06/19/2023, 9:55 AM  Clinical Narrative:                  9:55 AM CSW introduced herself and role to patient at bedside. CSW informed patient of TOC substance use consult and offered resources/education/counseling resources. Patient declined offer.    Barriers to Discharge: Continued Medical Work up   Patient Goals and CMS Choice            Expected Discharge Plan and Services In-house Referral: Clinical Social Work     Living arrangements for the past 2 months: Single Family Home                                      Prior Living Arrangements/Services Living arrangements for the past 2 months: Single Family Home Lives with:: Self Patient language and need for interpreter reviewed:: Yes        Need for Family Participation in Patient Care: Yes (Comment) Care giver support system in place?: Yes (comment)   Criminal Activity/Legal Involvement Pertinent to Current Situation/Hospitalization: No - Comment as needed  Activities of Daily Living   ADL Screening (condition at time of admission) Independently performs ADLs?: Yes (appropriate for developmental age) Is the patient deaf or have difficulty hearing?: No  Permission Sought/Granted                  Emotional Assessment Appearance:: Appears stated age Attitude/Demeanor/Rapport: Engaged Affect (typically observed): Accepting, Adaptable, Appropriate, Calm, Stable Orientation: : Oriented to Self, Oriented to Place, Oriented to  Time, Oriented to Situation Alcohol / Substance Use: Alcohol Use Psych Involvement: No (comment)  Admission diagnosis:  Ischemia of toe [I99.8] 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) [I77.1, I96] Leukopenia, unspecified type [D72.819] Patient Active Problem  List   Diagnosis Date Noted   Ischemia of toe 06/18/2023   PAD (peripheral artery disease) (HCC) 06/17/2023   Hospital discharge follow-up 11/02/2021   Critical limb ischemia of both lower extremities (HCC) 10/19/2021   Osteomyelitis of toe of right foot (HCC) 10/19/2021   Substance abuse (HCC) 10/19/2021   Degenerative disc disease, lumbar 05/29/2021   Idiopathic peripheral neuropathy 05/25/2021   Subcutaneous cyst 05/25/2021   Essential hypertension 03/27/2021   Chronic bilateral low back pain without sciatica 03/27/2021   Ingrown nail of great toe of right foot 03/27/2021   Leg cramps 03/27/2021   Tobacco abuse 03/27/2021   Alcohol use 03/27/2021   PCP:  Anabel Halon, MD Pharmacy:   [facility] [address], [place] - [address] [place] [address] Phone: [phone] Fax: [phone]  [facility] [address] [place] [address] Phone: [phone] Fax: [phone]  [facility] [address] [place] ([place]), [place] - [address] [place] [address] Phone: [phone] Fax: [phone]     Social Drivers of Health (SDOH) Social History: SDOH Screenings   Food Insecurity: No Food Insecurity (06/18/2023)  Housing: Low Risk  (06/18/2023)  Transportation Needs: No Transportation Needs (06/18/2023)  Utilities: Not At Risk (06/18/2023)  Depression (PHQ2-9): Low Risk  (06/17/2023)  Social Connections: Unknown (06/18/2023)  Tobacco Use: High Risk (06/18/2023)  SDOH Interventions:     Readmission Risk Interventions     No data to display

## 2023-06-19 NOTE — Progress Notes (Signed)
PROGRESS NOTE  Paul Madden  NFA:[id number] DOB: [birth year] DOA: 06/18/2023 PCP: Anabel Halon, MD   Brief Narrative:  Patient is a 68 year old male with history of hypertension, peripheral artery disease, alcohol use, polysubstance abuse who presented with intermittent pain, dark discoloration of left fifth toe at [facility].  On presentation, he had low-grade fever.  Lab work showed leukopenia.  CT angiogram showed 5 cm segmental occlusion of the distal left SFA.  Case discussed with vascular surgery.  Patient transferred here for vascular intervention.  Plan for aortoiliac angiogram with possible intervention of the left foot tomorrow.    Assessment & Plan:  Principal Problem:   Ischemia of toe Active Problems:   Alcohol use   Essential hypertension   Idiopathic peripheral neuropathy   PAD (peripheral artery disease) (HCC)  Peripheral disease/ischemia of left foot: History of peripheral artery disease. 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  Status post amputation of right great toe.  Presented with left fifth toe discoloration, intermittent pain.  No history of diabetes.  CT angiogram showed 5 cm segmental occlusion of the distal left SFA.  Case discussed with vascular surgery.  Patient transferred here for vascular intervention. No sign of infection, antibiotics on hold. A1c 5.2,checking lipid panel.  Home medications show Lipitor, not sure he was taking those.  Plan for aortoiliac angiogram with possible intervention of the left foot tomorrow  Chronic alcohol abuse: Currently not in withdrawal.  Continue thiamine, folic acid.  Drinks beer  Tobacco abuse: Smokes a pack a day.  Counseled for cessation.    Hypertension: On Norvasc  Leukopenia/normocytic anemia: Unclear etiology.  WBC of 1.46.  HIV nonreactive.  Patient does not look septic.  Afebrile.  Will investigate more if he continues to be leukopenic  Substance abuse: UDS  positive for opiates, cocaine.Counselled for cessation         DVT prophylaxis:heparin injection 5,000 Units Start: 06/18/23 2300 SCDs Start: 06/18/23 2213     Code Status: Full Code  Family Communication: None at bedside  Patient status:Inpatient  Patient is from :home  Anticipated discharge IO:NGEX  Estimated DC date:after vascular surgery intervention.   Consultants:  Vascular surgery Procedures: None yet  Antimicrobials:  Anti-infectives (From admission, onward)    None       Subjective: Patient seen and examined at the bedside today.  He was hemodynamically stable.  Lying comfortably on the bed.  Denies new complaints.  Pain on the left foot well-controlled.  Objective: Vitals:   06/18/23 2000 06/18/23 2213 06/19/23 0608 06/19/23 0744  BP: 139/78 136/79 (!) 147/84 (!) 152/77  Pulse: 68 69 72 70  Resp: 16 18 18    Temp:  98.1 F (36.7 C) 97.8 F (36.6 C) 98 F (36.7 C)  TempSrc:  Oral Oral   SpO2:  99% 97% 100%  Weight:      Height:        Intake/Output Summary (Last 24 hours) at 06/19/2023 0809 Last data filed at 06/18/2023 2230 Gross per 24 hour  Intake 720 ml  Output --  Net 720 ml   Filed Weights   06/18/23 1421  Weight: 54 kg    Examination:  General exam: Overall comfortable, not in distress HEENT: PERRL Respiratory system:  no wheezes or crackles  Cardiovascular system: S1 & S2 heard, RRR.  Gastrointestinal system: Abdomen is nondistended, soft and nontender. Central nervous system: Alert and oriented Extremities: Amputation of the right great toe, blackish discoloration of the  left small toe. Skin: No rashes, no ulcers,no icterus     Data Reviewed: I have personally reviewed following labs and imaging studies  CBC: Recent Labs  Lab 06/18/23 1555 06/19/23 0420  WBC 1.4* 1.6*  NEUTROABS 0.5*  --   HGB 10.3* 9.7*  HCT 32.6* 30.8*  MCV 87.4 86.0  PLT 392 374   Basic Metabolic Panel: Recent Labs  Lab 06/18/23 1555  06/19/23 0420  NA 137 138  K 4.2 4.6  CL 103 106  CO2 25 29  GLUCOSE 95 97  BUN 16 15  CREATININE 1.12 1.11  CALCIUM 8.3* 8.5*  MG  --  2.1  PHOS  --  4.1     Recent Results (from the past 240 hours)  Blood culture (routine x 2)     Status: None (Preliminary result)   Collection Time: 06/18/23  5:27 PM   Specimen: BLOOD RIGHT FOREARM  Result Value Ref Range Status   Specimen Description BLOOD RIGHT FOREARM  Final   Special Requests   Final    BOTTLES DRAWN AEROBIC AND ANAEROBIC Blood Culture results may not be optimal due to an inadequate volume of blood received in culture bottles   Culture   Final    NO GROWTH < 24 HOURS Performed at [facility], [address] [place], [place] [address]    Report Status PENDING  Incomplete  Blood culture (routine x 2)     Status: None (Preliminary result)   Collection Time: 06/18/23  5:27 PM   Specimen: Right Antecubital; Blood  Result Value Ref Range Status   Specimen Description RIGHT ANTECUBITAL  Final   Special Requests   Final    BOTTLES DRAWN AEROBIC AND ANAEROBIC Blood Culture results may not be optimal due to an inadequate volume of blood received in culture bottles   Culture   Final    NO GROWTH < 24 HOURS Performed at [facility], [address], [place], [place] [address]    Report Status PENDING  Incomplete     Radiology Studies: CT Angio Aortobifemoral W and/or Wo Contrast Result Date: 06/18/2023 CLINICAL DATA:  Left fifth toe pain, loss of nail EXAM: CT ANGIOGRAPHY OF ABDOMINAL AORTA WITH ILIOFEMORAL RUNOFF TECHNIQUE: Multidetector CT imaging of the abdomen, pelvis and lower extremities was performed using the standard protocol during bolus administration of intravenous contrast. Multiplanar CT image reconstructions and MIPs were obtained to evaluate the vascular anatomy. RADIATION DOSE REDUCTION: This exam was performed according to the departmental dose-optimization program which includes automated exposure control,  adjustment of the mA and/or kV according to patient size and/or use of iterative reconstruction technique. CONTRAST:  OMNIPAQUE IOHEXOL 350 MG/ML SOLN COMPARISON:  10/20/2021 FINDINGS: VASCULAR Aorta: Normal caliber aorta without aneurysm, dissection, vasculitis or significant stenosis. Moderate atherosclerosis. Celiac: Stable mild stenosis at the origin of the celiac artery. No aneurysm, dissection, or vasculitis. SMA: Patent without evidence of aneurysm, dissection, vasculitis or significant stenosis. Mild atheromatous plaque at the origin of the SMA unchanged. Renals: There are 2 right-sided renal arteries, again with high-grade stenosis of the more inferior right renal artery. There is a left pelvic kidney, with 2 left renal arteries identified. The more superior artery arises from the ventral margin of the distal aorta and the more inferior artery arises from the right common iliac artery. Stable atherosclerosis without high-grade stenosis. No evidence of aneurysm, dissection, vasculitis, or fibromuscular dysplasia. IMA: Patent without evidence of aneurysm, dissection, vasculitis or significant stenosis. RIGHT Lower Extremity Inflow: Chronic  atherosclerosis of the right common iliac and external iliac arteries without evidence of high-grade stenosis. 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 No evidence of aneurysm or dissection. Outflow: Interval right femoral-popliteal bypass graft. 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 The bypass graft appears patent, though the distal aspect is not well evaluated due to imaging out pacing the contrast bolus. There is complete occlusion of the native right superficial femoral artery. The native right profundus femoral artery is patent. Runoff: Trifurcation vessels cannot be evaluated due to timing of the contrast bolus, with the scanner out pacing the bolus beyond the level  of the distal thigh. LEFT Lower Extremity Inflow: Chronic atherosclerosis of the left common iliac and internal iliac arteries without high-grade stenosis. 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 No evidence of aneurysm or dissection. Outflow: There is reconstitution of the left common femoral artery at the level of the bifurcation via collaterals from the left internal iliac. The left superficial femoral and profundus femoral arteries are mildly decreasing caliber, bladder widely patent within their proximal and mid extends. There is segmental occlusion of the left SFA as it exits Hunter's canal, new since prior study, extending approximately 5 cm in length reference image 89/8. There is reconstitution of the distal left SFA and popliteal arteries via profundus femoral collaterals. No evidence of aneurysm or dissection. Runoff: The left trifurcation vessels are diminutive but patent through the level of the ankle. Veins: No obvious venous abnormality within the limitations of this arterial phase study. Review of the MIP images confirms the above findings. NON-VASCULAR Lower chest: Areas of subpleural linear consolidation within the anterior left lower lobe and right middle lobe likely reflect subsegmental atelectasis. There is nodular subpleural consolidation within the medial left lower lobe measuring 8 mm reference image 16/4, indeterminate. Hepatobiliary: No focal liver abnormality is seen. No gallstones, gallbladder wall thickening, or biliary dilatation. Pancreas: Unremarkable. No pancreatic ductal dilatation or surrounding inflammatory changes. Spleen: Normal in size without focal abnormality. Adrenals/Urinary Tract: Left pelvic kidney. Mild bilateral renal cortical thinning. No urinary tract calculi or obstructive uropathy within either kidney. Stable appearance of the adrenal glands. Mild bladder wall thickening is likely due to under distension. Stomach/Bowel: No bowel obstruction no  bowel obstruction or ileus. Right inguinal hernia contains multiple loops of bowel without evidence of incarceration or obstruction. No bowel wall thickening or inflammatory change. Lymphatic: No pathologic adenopathy within the abdomen or pelvis. Reproductive: Stable appearance of the prostate. Other: No free fluid or free intraperitoneal gas. Right inguinal hernia containing multiple loops of small bowel, stable. No incarceration or obstruction. Musculoskeletal: There are no acute displaced fractures. Evidence of prior amputation of the first digit at the level of the metatarsophalangeal joint. No CT evidence of osteomyelitis within the area of concern at the left fifth digit. Reconstructed images demonstrate no additional findings. IMPRESSION: VASCULAR 1. 5 cm segmental occlusion of the distal left SFA, new since prior study. There is distal reconstitution of the left SFA and popliteal artery via collaterals from the profundus femoral system. The left popliteal artery and trifurcation vessels are diminutive, but patent through the level of the ankle. 2. 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 Imaging out paces the contrast bolus beyond the mid right thigh, limiting evaluation for patency of the distal aspect of the bypass graft and right lower  extremity runoff vessels. 3. 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 4. 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 5.  Aortic Atherosclerosis (ICD10-I70.0). NON-VASCULAR 1. Areas of subpleural consolidation within the lung bases, with slightly nodular appearance in the medial left lower lobe, favor subsegmental atelectasis. Follow-up may be warranted to exclude pulmonary nodule. 2. Stable right inguinal hernia  containing multiple loops of distal small bowel. No incarceration or obstruction. 3. Left pelvic kidney. Electronically Signed   By: Sharlet Salina M.D.   On: 06/18/2023 17:03    Scheduled Meds:  folic acid  1 mg Oral Daily   heparin  5,000 Units Subcutaneous Once   multivitamin with minerals  1 tablet Oral Daily   thiamine  100 mg Oral Daily   Or   thiamine  100 mg Intravenous Daily   Continuous Infusions:   LOS: 1 day   Burnadette Pop, MD [facility] P1/22/2025, 8:09 AM

## 2023-06-19 NOTE — Plan of Care (Signed)

## 2023-06-19 NOTE — Consult Note (Addendum)
[facility] [place] CONSULT NOTE   MRN : [id number]  Reason for Consult: f/u s/p right fem-pop bypass lost to follow up with new left fifth toe dry gangrene wound.   History of Present Illness: 68 y/o male with PAD and history of CLI right LE s/p Right common femoral endarterectomy, right fem BK popliteal bypass with vein and right GT amp on 10/24/21.  He missed his OP follow up.    He has no history of injury to the left foot.  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  He is not sure how long it has been there.  He is ambulatory with intermittent pain in the left fifth toe.  He denies frank claudication or rest pain.     He is positive for substance abuse.  Cocaine and opiates.     He reported to the ED with pain and skin changes on the left foot.  He waas found to have leukopenia WBC 1.4.  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 The bypass graft appears patent, though the distal aspect is not well evaluated due to imaging out pacing the contrast bolus.     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 Outflow: There is reconstitution of the left common femoral artery at the level of the bifurcation via collaterals from the left internal iliac. The left superficial femoral and profundus femoral arteries are mildly decreasing caliber.          Current Facility-Administered Medications  Medication Dose Route Frequency Provider Last Rate Last Admin   acetaminophen (TYLENOL) tablet 650 mg  650 mg Oral Q6H PRN Emokpae, Ejiroghene E, MD   650 mg at 06/18/23 2244   Or   acetaminophen (TYLENOL) suppository 650 mg  650 mg Rectal Q6H PRN Emokpae, Ejiroghene E, MD       amLODipine (NORVASC) tablet 10 mg  10 mg Oral Daily Adhikari, Amrit, MD       folic acid (FOLVITE) tablet 1 mg  1 mg Oral Daily  Emokpae, Ejiroghene E, MD       heparin injection 5,000 Units  5,000 Units Subcutaneous Once Emokpae, Ejiroghene E, MD       LORazepam (ATIVAN) tablet 1-4 mg  1-4 mg Oral Q1H PRN Emokpae, Ejiroghene E, MD       Or   LORazepam (ATIVAN) injection 1-4 mg  1-4 mg Intravenous Q1H PRN Emokpae, Ejiroghene E, MD       morphine (PF) 2 MG/ML injection 2 mg  2 mg Intravenous Q4H PRN Emokpae, Ejiroghene E, MD   2 mg at 06/19/23 0603   multivitamin with minerals tablet 1 tablet  1 tablet Oral Daily Emokpae, Ejiroghene E, MD       ondansetron (ZOFRAN) tablet 4 mg  4 mg Oral Q6H PRN Emokpae, Ejiroghene E, MD       Or   ondansetron (ZOFRAN) injection 4 mg  4 mg Intravenous Q6H PRN Emokpae, Ejiroghene E, MD       polyethylene glycol (MIRALAX / GLYCOLAX) packet 17 g  17 g Oral Daily PRN Emokpae, Ejiroghene E, MD       thiamine (VITAMIN B1) tablet 100 mg  100 mg Oral Daily Emokpae, Ejiroghene E, MD       Or   thiamine (VITAMIN B1) injection 100 mg  100  mg Intravenous Daily Emokpae, Ejiroghene E, MD        Pt meds include: Statin :Yes Betablocker: No ASA: No Other anticoagulants/antiplatelets: None  Past Medical History:  Diagnosis Date   Chronic low back pain    Hypertension     Past Surgical History:  Procedure Laterality Date   AMPUTATION TOE Right 10/24/2021   Procedure: AMPUTATION OF THE RIGHT GREAT TOE;  Surgeon: Maeola Harman, MD;  Location: [facility] OR;  Service: Vascular;  Laterality: Right;   FEMORAL-POPLITEAL BYPASS GRAFT Right 10/24/2021   Procedure: RIGHT BYPASS GRAFT COMMON FEMORAL-POPLITEAL ARTERY;  Surgeon: Maeola Harman, MD;  Location: [facility] OR;  Service: Vascular;  Laterality: Right;    Social History Social History   Tobacco Use   Smoking status: Every Day   Smokeless tobacco: Never  Substance Use Topics   Alcohol use: Yes    Comment: occ   Drug use: No    Family History History reviewed. No pertinent family history.  No Known Allergies   REVIEW OF  SYSTEMS  General: [ ]  Weight loss, [ ]  Fever, [ ]  chills Neurologic: [ ]  Dizziness, [ ]  Blackouts, [ ]  Seizure [ ]  Stroke, [ ]  "Mini stroke", [ ]  Slurred speech, [ ]  Temporary blindness; [ ]  weakness in arms or legs, [ ]  Hoarseness [ ]  Dysphagia Cardiac: [ ]  Chest pain/pressure, [ ]  Shortness of breath at rest [ ]  Shortness of breath with exertion, [ ]  Atrial fibrillation or irregular heartbeat  Vascular: [ ]  Pain in legs with walking, [ ]  Pain in legs at rest, [ ]  Pain in legs at night,  [x ] Non-healing ulcer, [ ]  Blood clot in vein/DVT,   Pulmonary: [ ]  Home oxygen, [ ]  Productive cough, [ ]  Coughing up blood, [ ]  Asthma,  [ ]  Wheezing [ ]  COPD Musculoskeletal:  [ ]  Arthritis, [ ]  Low back pain, [ ]  Joint pain Hematologic: [ ]  Easy Bruising, [ ]  Anemia; [ ]  Hepatitis Gastrointestinal: [ ]  Blood in stool, [ ]  Gastroesophageal Reflux/heartburn, Urinary: [ ]  chronic Kidney disease, [ ]  on HD - [ ]  MWF or [ ]  TTHS, [ ]  Burning with urination, [ ]  Difficulty urinating Skin: [ ]  Rashes, [ ]  Wounds Psychological: [ ]  Anxiety, [ ]  Depression  Physical Examination Vitals:   06/18/23 2000 06/18/23 2213 06/19/23 0608 06/19/23 0744  BP: 139/78 136/79 (!) 147/84 (!) 152/77  Pulse: 68 69 72 70  Resp: 16 18 18    Temp:  98.1 F (36.7 C) 97.8 F (36.6 C) 98 F (36.7 C)  TempSrc:  Oral Oral   SpO2:  99% 97% 100%  Weight:      Height:       Body mass index is 17.58 kg/m.  General:  WDWN in NAD HENT: WNL Eyes: Pupils equal Pulmonary: normal non-labored breathing , without Rales, rhonchi,  wheezing Cardiac: RRR, without  Murmurs, rubs or gallops; No carotid bruits Abdomen: soft, NT, no masses Skin: no rashes, ulcers noted;  left fifth toe tip Gangrene , no cellulitis; no open wounds;    Vascular Exam/Pulses:Palpable right femoral and DP pulse, non palpable left femoral or pedal pulses   Musculoskeletal: no muscle wasting or atrophy; no edema  Neurologic: A&O X 3; Appropriate Affect ;   SENSATION: normal; MOTOR FUNCTION: 5/5 Symmetric Speech is fluent/normal   Significant Diagnostic Studies: CBC Lab Results  Component Value Date   WBC 1.6 (L) 06/19/2023   HGB 9.7 (L) 06/19/2023   HCT 30.8 (  L) 06/19/2023   MCV 86.0 06/19/2023   PLT 374 06/19/2023    BMET    Component Value Date/Time   NA 138 06/19/2023 0420   NA 145 (H) 11/01/2021 1542   K 4.6 06/19/2023 0420   CL 106 06/19/2023 0420   CO2 29 06/19/2023 0420   GLUCOSE 97 06/19/2023 0420   BUN 15 06/19/2023 0420   BUN 19 11/01/2021 1542   CREATININE 1.11 06/19/2023 0420   CALCIUM 8.5 (L) 06/19/2023 0420   GFRNONAA >60 06/19/2023 0420   Estimated Creatinine Clearance: 49.3 mL/min (by C-G formula based on SCr of 1.11 mg/dL).  COAG Lab Results  Component Value Date   INR 1.1 10/23/2021     Non-Invasive Vascular Imaging:   CTA     FINDINGS: VASCULAR   Aorta: Normal caliber aorta without aneurysm, dissection, vasculitis or significant stenosis. Moderate atherosclerosis.   Celiac: Stable mild stenosis at the origin of the celiac artery. No aneurysm, dissection, or vasculitis.   SMA: Patent without evidence of aneurysm, dissection, vasculitis or significant stenosis. Mild atheromatous plaque at the origin of the SMA unchanged.   Renals: There are 2 right-sided renal arteries, again with high-grade stenosis of the more inferior right renal artery.   There is a left pelvic kidney, with 2 left renal arteries identified. The more superior artery arises from the ventral margin of the distal aorta and the more inferior artery arises from the right common iliac artery. Stable atherosclerosis without high-grade stenosis.   No evidence of aneurysm, dissection, vasculitis, or fibromuscular dysplasia.   IMA: Patent without evidence of aneurysm, dissection, vasculitis or significant stenosis.   RIGHT Lower Extremity   Inflow: Chronic atherosclerosis of the right common iliac and external  iliac arteries without evidence of high-grade stenosis. 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 No evidence of aneurysm or dissection.   Outflow: Interval right femoral-popliteal bypass graft. 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 The bypass graft appears patent, though the distal aspect is not well evaluated due to imaging out pacing the contrast bolus. There is complete occlusion of the native right superficial femoral artery. The native right profundus femoral artery is patent.   Runoff: Trifurcation vessels cannot be evaluated due to timing of the contrast bolus, with the scanner out pacing the bolus beyond the level of the distal thigh.   LEFT Lower Extremity   Inflow: Chronic atherosclerosis of the left common iliac and internal iliac arteries without high-grade stenosis. 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 No evidence of aneurysm or dissection.   Outflow: There is reconstitution of the left common femoral artery at the level of the bifurcation via collaterals from the left internal iliac. The left superficial femoral and profundus femoral arteries are mildly decreasing caliber, bladder widely patent within their proximal and mid extends. There is segmental occlusion of the left SFA as it exits Hunter's canal, new since prior study, extending approximately 5 cm in length reference image 89/8. There is reconstitution of the distal left SFA and popliteal arteries via profundus femoral collaterals. No evidence of aneurysm or dissection.   Runoff: The left trifurcation vessels are diminutive but patent through the level of the ankle.   Veins: No obvious venous abnormality within the limitations of this arterial phase study.  ASSESSMENT/PLAN:   Known history of right LE CLI requiring Right femoral endarterectomy and fem-BK popliteal bypass.  The  bypass  is patent and he has a palpable DP pulse on the right LE.  Her has developed a new non healing fifth toe wound on the left.   I was unable to palpate a left femoral pulse or pedal pulses.  The CTA demonstrated left CIA and IIA with chronic atherosclerosis without high-grade stenosis.  He does have chronic occlusion of the EIA there are collaterals that from the left IIA.  The SFA/profunda are small in size, segmental occlusion of the left SFA as it exits Hunter's canal, There is reconstitution of the distal left SFA and popliteal arteries via profundus femoral collaterals. Runoff to the level of the ankle.     He would benefit from angiogram Aortoiliac with possible intervention to improve the inflow and heal the left foot wound.     Pending PVL schedule and Dr. Darcella Cheshire assessment. He is stable currently.  No urgent or emergent need to intervene.         [name] 06/19/2023 8:36 AM   I have independently interviewed and examined patient and agree with PA assessment and plan above.  I reviewed his CTA and have ordered ABIs.  He has long segment external iliac artery occlusion and common femoral artery occlusion on the left and will require comfortable endarterectomy with retrograde stenting and possible distal bypass.  Will begin with angiography tomorrow to evaluate for distal target and will then need to schedule him for extensive procedure as above.  Rennie Hack C. Randie Heinz, MD [facility] [place] Office: [phone] Pager: [phone]

## 2023-06-20 ENCOUNTER — Encounter (HOSPITAL_COMMUNITY)
Admission: EM | Disposition: A | Discharge status: Home / Self Care | Payer: Self-pay | Source: Home / Self Care | Attending: Internal Medicine

## 2023-06-20 DIAGNOSIS — I70245 Atherosclerosis of native arteries of left leg with ulceration of other part of foot: Secondary | ICD-10-CM

## 2023-06-20 DIAGNOSIS — I998 Other disorder of circulatory system: Secondary | ICD-10-CM | POA: Diagnosis not present

## 2023-06-20 HISTORY — PX: LOWER EXTREMITY ANGIOGRAPHY: CATH118251

## 2023-06-20 LAB — CBC
HCT: 30.7 % — ABNORMAL LOW (ref 39.0–52.0)
Hemoglobin: 9.8 g/dL — ABNORMAL LOW (ref 13.0–17.0)
MCH: 27.4 pg (ref 26.0–34.0)
MCHC: 31.9 g/dL (ref 30.0–36.0)
MCV: 85.8 fL (ref 80.0–100.0)
Platelets: 395 10*3/uL (ref 150–400)
RBC: 3.58 MIL/uL — ABNORMAL LOW (ref 4.22–5.81)
RDW: 13.7 % (ref 11.5–15.5)
WBC: 1.4 10*3/uL — CL (ref 4.0–10.5)
nRBC: 0 % (ref 0.0–0.2)

## 2023-06-20 LAB — LIPID PANEL
Cholesterol: 101 mg/dL (ref 0–200)
HDL: 16 mg/dL — ABNORMAL LOW (ref >40)
LDL Cholesterol: 65 mg/dL (ref 0–99)
Total CHOL/HDL Ratio: 6.3 {ratio}
Triglycerides: 101 mg/dL (ref <150)
VLDL: 20 mg/dL (ref 0–40)

## 2023-06-20 SURGERY — LOWER EXTREMITY ANGIOGRAPHY
Anesthesia: LOCAL

## 2023-06-20 MED ORDER — FENTANYL CITRATE (PF) 100 MCG/2ML IJ SOLN
INTRAMUSCULAR | Status: AC
  Filled 2023-06-20: qty 2

## 2023-06-20 MED ORDER — SODIUM CHLORIDE 0.9 % IV SOLN: INTRAVENOUS | Status: AC

## 2023-06-20 MED ORDER — SODIUM CHLORIDE 0.9 % IV SOLN: 250.0000 mL | INTRAVENOUS | Status: DC | PRN

## 2023-06-20 MED ORDER — MIDAZOLAM HCL 2 MG/2ML IJ SOLN
INTRAMUSCULAR | Status: AC
  Filled 2023-06-20: qty 2

## 2023-06-20 MED ORDER — MIDAZOLAM HCL 2 MG/2ML IJ SOLN
INTRAMUSCULAR | Status: DC | PRN
  Administered 2023-06-20: 1 mg via INTRAVENOUS

## 2023-06-20 MED ORDER — HEPARIN (PORCINE) IN NACL 1000-0.9 UT/500ML-% IV SOLN
INTRAVENOUS | Status: DC | PRN
  Administered 2023-06-20 (×2): 500 mL

## 2023-06-20 MED ORDER — FENTANYL CITRATE (PF) 100 MCG/2ML IJ SOLN
INTRAMUSCULAR | Status: DC | PRN
  Administered 2023-06-20: 25 ug via INTRAVENOUS

## 2023-06-20 MED ORDER — LIDOCAINE HCL (PF) 1 % IJ SOLN
INTRAMUSCULAR | Status: DC | PRN
  Administered 2023-06-20: 15 mL

## 2023-06-20 MED ORDER — LABETALOL HCL 5 MG/ML IV SOLN: 10.0000 mg | INTRAVENOUS | Status: DC | PRN

## 2023-06-20 MED ORDER — SODIUM CHLORIDE 0.9% FLUSH: 3.0000 mL | INTRAVENOUS | Status: DC | PRN

## 2023-06-20 MED ORDER — IODIXANOL 320 MG/ML IV SOLN
INTRAVENOUS | Status: DC | PRN
  Administered 2023-06-20: 60 mL

## 2023-06-20 MED ORDER — HYDRALAZINE HCL 20 MG/ML IJ SOLN: 5.0000 mg | INTRAMUSCULAR | Status: DC | PRN

## 2023-06-20 MED ORDER — SODIUM CHLORIDE 0.9% FLUSH
3.0000 mL | Freq: Two times a day (BID) | INTRAVENOUS | Status: DC
  Administered 2023-06-20 – 2023-06-21 (×2): 3 mL via INTRAVENOUS

## 2023-06-20 MED ORDER — LIDOCAINE HCL (PF) 1 % IJ SOLN
INTRAMUSCULAR | Status: AC
  Filled 2023-06-20: qty 30

## 2023-06-20 MED ORDER — ACETAMINOPHEN 325 MG PO TABS
650.0000 mg | ORAL_TABLET | ORAL | Status: DC | PRN
  Administered 2023-06-21: 650 mg via ORAL
  Filled 2023-06-20: qty 2

## 2023-06-20 SURGICAL SUPPLY — 9 items
CATH OMNI FLUSH 5F 65CM (CATHETERS) IMPLANT
KIT MICROPUNCTURE NIT STIFF (SHEATH) IMPLANT
KIT SINGLE USE MANIFOLD (KITS) IMPLANT
SET ATX-X65L (MISCELLANEOUS) IMPLANT
SHEATH PINNACLE 5F 10CM (SHEATH) IMPLANT
SHEATH PROBE COVER 6X72 (BAG) IMPLANT
TRAY PV CATH (CUSTOM PROCEDURE TRAY) ×1 IMPLANT
WIRE BENTSON .035X145CM (WIRE) IMPLANT
WIRE MICRO SET SILHO 5FR 7 (SHEATH) IMPLANT

## 2023-06-20 NOTE — Op Note (Signed)
    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  Ultrasound-guided access right common femoral artery 2.  Aortogram with catheter selection of abdominal aorta 3.  Left lower extremity arteriogram from catheter in abdominal aorta 4.  21 minutes of monitored moderate conscious sedation time  Indications: Patient is a 68 year old male that has previously undergone right leg revascularization.  Now with evidence of left fifth toe wound ongoing for months.  He presents for lower extremity arteriogram mostly for evaluation of distal target given known left external iliac and common femoral artery occlusion on CT.  Risks benefits discussed.  Findings:   Ultrasound-guided access right common femoral artery.  Abdominal aortogram showed patent infrarenal aorta.  The right renal artery has a high-grade stenosis.  The left common iliac artery has about a 50% stenosis proximally from calcified plaque.  The hypogastric on the left is patent with an occluded external and common femoral.  The distal common femoral reconstitutes with a patent profunda and SFA.  The SFA is patent except for mid to distal SFA has a short 5 cm occlusion.  Distally there is a patent popliteal with three-vessel runoff.  AT/PT are dominant flow into the foot.   Procedure:  The patient was identified in the holding area and taken to room 8.  The patient was then placed supine on the table and prepped and draped in the usual sterile fashion.  A time out was called.  Patient received Versed and fentanyl for conscious moderate sedation.  Vital signs were monitored including heart rate, respiratory rate, oxygenation and blood pressure.  I was present for all of moderate sedation.  Ultrasound was used to evaluate the right  common femoral artery.  It was patent .  A digital ultrasound image was acquired.  A micropuncture needle was used to access the right common femoral artery under ultrasound guidance.  An 018 wire was advanced without resistance and a micropuncture sheath was placed.  The 018 wire was removed and a benson wire was placed.  The micropuncture sheath was exchanged for a 5 french sheath.  An omniflush catheter was advanced over the wire to the level of L-1.  An abdominal angiogram was obtained.  Given left iliac occlusion I left the catheter in the abdominal aorta.  We then got staged imaging of the left lower extremity for identification of distal targets.  Findings are noted above.  Wires and catheters were removed.  The 5 French sheath in the right groin was removed while holding pressure.  Plan: Patient will need left common femoral endarterectomy with retrograde iliac stenting and likely antegrade stenting as wel for SFA diseasel.  Will post for the OR with Dr. Randie Heinz in the near future.  Likely discharge in the interim.   Cephus Shelling, MD [facility] Office: [phone]

## 2023-06-20 NOTE — Progress Notes (Addendum)
[facility]  Subjective  - No new complaints   Objective 129/74 79 98.3 F (36.8 C) (Oral) 18 100% No intake or output data in the 24 hours ending 06/20/23 0657    Assessment/Planning: PAD with non healing left fifth toe wound CTA: shows external iliac artery occlusion and common femoral artery occlusion on the left   Plan for angiogram with possible iliac stent, arthrectomy, and angioplasty.  He may need a bypass to improve inflow to the tibials pending angiogram results.   He agrees to proceed and is NPO  [name] 06/20/2023 6:57 AM --  Laboratory Lab Results: Recent Labs    06/19/23 0420 06/20/23 0505  WBC 1.6* PENDING  HGB 9.7* 9.8*  HCT 30.8* 30.7*  PLT 374 395   BMET Recent Labs    06/18/23 1555 06/19/23 0420  NA 137 138  K 4.2 4.6  CL 103 106  CO2 25 29  GLUCOSE 95 97  BUN 16 15  CREATININE 1.12 1.11  CALCIUM 8.3* 8.5*    COAG Lab Results  Component Value Date   INR 1.1 10/23/2021   No results found for: "PTT"   I have independently interviewed and examined patient and agree with PA assessment and plan above.  ABI is moderately depressed and monophasic with toe pressure of 0 and vein mapping demonstrates marginal saphenous vein.  Angiography today from right common femoral approach to evaluate for distal target for bypass.  Will need at least left common femoral endarterectomy with retrograde stenting and probable left femoral to below-knee bypass.  This was discussed with the patient and he remains n.p.o. for angiogram today and formal plans to follow angiography results.  Precious Segall C. Randie Heinz, MD [facility] Office: [phone] Pager: [phone]

## 2023-06-20 NOTE — Plan of Care (Signed)

## 2023-06-20 NOTE — Progress Notes (Signed)
PROGRESS NOTE  Paul Madden  JXB:[id number] DOB: 1955-10-24 DOA: 06/18/2023 PCP: Anabel Halon, MD   Brief Narrative:  Patient is a 68 year old male with history of hypertension, peripheral artery disease, alcohol use, polysubstance abuse who presented with intermittent pain, dark discoloration of left fifth toe at [facility].  On presentation, he had low-grade fever.  Lab work showed leukopenia.  CT angiogram showed 5 cm segmental occlusion of the distal left SFA.  Case discussed with vascular surgery.  Patient transferred here for vascular intervention.  Plan for angiogram with possible iliac stent, arthrectomy, and angioplasty by vascular surgery today.  Assessment & Plan:  Principal Problem:   Ischemia of toe Active Problems:   Alcohol use   Essential hypertension   Idiopathic peripheral neuropathy   PAD (peripheral artery disease) (HCC)  Peripheral disease/ischemia of left foot: History of peripheral artery disease. 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  Status post amputation of right great toe.  Presented with left fifth toe discoloration, intermittent pain.  No history of diabetes.  CT angiogram showed 5 cm segmental occlusion of the distal left SFA.  Case discussed with vascular surgery.  Patient transferred here for vascular intervention. No sign of infection, antibiotics on hold. A1c 5.2, LDL of 65.  Home medications show Lipitor, not sure he was taking those. Plan for for angiogram with possible iliac stent, arthrectomy, and angioplasty.  As per vascular surgery, he may need a bypass surgery.  Chronic alcohol abuse: Currently not in withdrawal.  Continue thiamine, folic acid.  Drinks beer  Tobacco abuse: Smokes a pack a day.  Counseled for cessation.    Hypertension: On Norvasc.  Currently blood pressure stable  Leukopenia/normocytic anemia: Unclear etiology.  WBC of 1.4.  HIV nonreactive.  Patient does not look septic.   Afebrile.  Most likely needs outpatient workup.  Will refer to hematology when he is closer to discharge.  Will check CBC with differential tomorrow  Substance abuse: UDS positive for opiates, cocaine.Counselled for cessation         DVT prophylaxis:heparin injection 5,000 Units Start: 06/18/23 2300 SCDs Start: 06/18/23 2213     Code Status: Full Code  Family Communication: None at bedside  Patient status:Inpatient  Patient is from :home  Anticipated discharge ZH:YQMV  Estimated DC date:after vascular surgery intervention.   Consultants:  Vascular surgery Procedures: None yet  Antimicrobials:  Anti-infectives (From admission, onward)    None       Subjective: Patient seen and examined at bedside today.  Hemodynamically.  Lying in bed.  Comfortable.  Waiting for surgery.  Eager to eat food   Objective: Vitals:   06/19/23 1724 06/19/23 2000 06/20/23 0518 06/20/23 0747  BP: 121/74 120/77 129/74 122/73  Pulse: 77 85 79 88  Resp: 18 20 18 20   Temp: 98 F (36.7 C) 98.9 F (37.2 C) 98.3 F (36.8 C) 98 F (36.7 C)  TempSrc:  Oral Oral Oral  SpO2: 100% 100% 100% 98%  Weight:      Height:       No intake or output data in the 24 hours ending 06/20/23 1110  Filed Weights   06/18/23 1421  Weight: 54 kg    Examination:  General exam: Overall comfortable, not in distress HEENT: PERRL Respiratory system:  no wheezes or crackles  Cardiovascular system: S1 & S2 heard, RRR.  Gastrointestinal system: Abdomen is nondistended, soft and nontender. Central nervous system: Alert and oriented Extremities: Amputation of right great  toe, black small left toe Skin: No rashes, no ulcers,no icterus      Data Reviewed: I have personally reviewed following labs and imaging studies  CBC: Recent Labs  Lab 06/18/23 1555 06/19/23 0420 06/20/23 0505  WBC 1.4* 1.6* 1.4*  NEUTROABS 0.5*  --   --   HGB 10.3* 9.7* 9.8*  HCT 32.6* 30.8* 30.7*  MCV 87.4 86.0 85.8  PLT  392 374 395   Basic Metabolic Panel: Recent Labs  Lab 06/18/23 1555 06/19/23 0420  NA 137 138  K 4.2 4.6  CL 103 106  CO2 25 29  GLUCOSE 95 97  BUN 16 15  CREATININE 1.12 1.11  CALCIUM 8.3* 8.5*  MG  --  2.1  PHOS  --  4.1     Recent Results (from the past 240 hours)  Blood culture (routine x 2)     Status: None (Preliminary result)   Collection Time: 06/18/23  5:27 PM   Specimen: BLOOD RIGHT FOREARM  Result Value Ref Range Status   Specimen Description BLOOD RIGHT FOREARM  Final   Special Requests   Final    BOTTLES DRAWN AEROBIC AND ANAEROBIC Blood Culture results may not be optimal due to an inadequate volume of blood received in culture bottles   Culture   Final    NO GROWTH 2 DAYS Performed at [facility], [address], [place], [place] [address]    Report Status PENDING  Incomplete  Blood culture (routine x 2)     Status: None (Preliminary result)   Collection Time: 06/18/23  5:27 PM   Specimen: Right Antecubital; Blood  Result Value Ref Range Status   Specimen Description RIGHT ANTECUBITAL  Final   Special Requests   Final    BOTTLES DRAWN AEROBIC AND ANAEROBIC Blood Culture results may not be optimal due to an inadequate volume of blood received in culture bottles   Culture   Final    NO GROWTH 2 DAYS Performed at [facility], [address] [place], [place] [address]    Report Status PENDING  Incomplete     Radiology Studies: VAS [place] ABI WITH/WO TBI Result Date: 06/19/2023  LOWER EXTREMITY DOPPLER STUDY Patient Name:  Paul Madden  Date of Exam:   06/19/2023 Medical Rec #: [id number]    Accession #:    [id number] Date of Birth: 1956-03-17    Patient Gender: M Patient Age:   87 years Exam Location:  [facility] Procedure:      VAS [place] ABI WITH/WO TBI Referring Phys: Lemar Livings --------------------------------------------------------------------------------  Indications: Claudication, gangrene, and peripheral artery disease. ischemic dry               gangrene to the left 5th toe  Vascular Interventions: S/P right common femoral endarterectomy, right fem BK                         popliteal bypass with vein and right great toe                         amputation on 10/24/21. Performing Technologist: Marilynne Halsted RDMS, RVT  Examination Guidelines: A complete evaluation includes at minimum, Doppler waveform signals and systolic blood pressure reading at the level of bilateral brachial, anterior tibial, and posterior tibial arteries, when vessel segments are accessible. Bilateral testing is considered an integral part of a complete examination. Photoelectric Plethysmograph (PPG) waveforms and toe systolic pressure readings are  included as required and additional duplex testing as needed. Limited examinations for reoccurring indications may be performed as noted.  ABI Findings: +---------+------------------+-----+---------+---------+ Right    Rt Pressure (mmHg)IndexWaveform Comment   +---------+------------------+-----+---------+---------+ Brachial 118                    triphasic          +---------+------------------+-----+---------+---------+ PTA      125               1.02 triphasic          +---------+------------------+-----+---------+---------+ DP       125               1.02 triphasic          +---------+------------------+-----+---------+---------+ Great Toe58                0.47 Abnormal 2nd digit +---------+------------------+-----+---------+---------+ +---------+------------------+-----+----------+-------+ Left     Lt Pressure (mmHg)IndexWaveform  Comment +---------+------------------+-----+----------+-------+ Brachial 123                    triphasic         +---------+------------------+-----+----------+-------+ PTA      58                0.47 monophasic        +---------+------------------+-----+----------+-------+ DP       63                0.51 monophasic         +---------+------------------+-----+----------+-------+ Great Toe0                 0.00 Absent            +---------+------------------+-----+----------+-------+ +-------+-----------+-----------+------------+------------+ ABI/TBIToday's ABIToday's TBIPrevious ABIPrevious TBI +-------+-----------+-----------+------------+------------+ Right  1.02                  0                        +-------+-----------+-----------+------------+------------+ Left   0.51       absent     0.65        0.05         +-------+-----------+-----------+------------+------------+  Summary: Right: Resting right ankle-brachial index is within normal range. Left: Resting left ankle-brachial index indicates moderate left lower extremity arterial disease. *See table(s) above for measurements and observations.  Electronically signed by Coral Else MD on 06/19/2023 at 7:27:26 PM.    Final    VAS [place] LOWER EXTREMITY SAPHENOUS VEIN MAPPING Result Date: 06/19/2023 LOWER EXTREMITY VEIN MAPPING Patient Name:  MADOX STALLINGS  Date of Exam:   06/19/2023 Medical Rec #: [id number]    Accession #:    [id number] Date of Birth: 15-May-1956    Patient Gender: M Patient Age:   45 years Exam Location:  [facility] Procedure:      VAS [place] LOWER EXTREMITY SAPHENOUS VEIN MAPPING Referring Phys: Lemar Livings --------------------------------------------------------------------------------  Indications:        Pre-op Other Indications:  ischemic dry gangrene to the left 5th toe Risk Factors:       Hypertension, current smoker, PAD. Other Risk Factors: Amutation right grerat toe 10/24/21.  Performing Technologist: Marilynne Halsted RDMS, RVT  Examination Guidelines: A complete evaluation includes B-mode imaging, spectral Doppler, color Doppler, and power Doppler as needed of all accessible portions of each vessel. Bilateral testing is considered an integral part of a complete examination. Limited examinations for reoccurring indications may be  performed as noted. +---------------+-----------+----------------------+---------------+-----------+   RT Diameter  RT Findings         GSV            LT Diameter  LT Findings      (cm)                                            (cm)                  +---------------+-----------+----------------------+---------------+-----------+                               Saphenofemoral         0.39                                                   Junction                                  +---------------+-----------+----------------------+---------------+-----------+                               Proximal thigh         0.36                  +---------------+-----------+----------------------+---------------+-----------+                                 Mid thigh            0.36       branching  +---------------+-----------+----------------------+---------------+-----------+                                Distal thigh          0.35                  +---------------+-----------+----------------------+---------------+-----------+                                    Knee              0.27                  +---------------+-----------+----------------------+---------------+-----------+                                 Prox calf            0.20                  +---------------+-----------+----------------------+---------------+-----------+                                  Mid calf            0.22                  +---------------+-----------+----------------------+---------------+-----------+  Distal calf           0.18                  +---------------+-----------+----------------------+---------------+-----------+                                   Ankle              0.20                  +---------------+-----------+----------------------+---------------+-----------+ Diagnosing physician: Coral Else MD Electronically signed by  Coral Else MD on 06/19/2023 at 7:26:51 PM.    Final    CT Angio Aortobifemoral W and/or Wo Contrast Result Date: 06/18/2023 CLINICAL DATA:  Left fifth toe pain, loss of nail EXAM: CT ANGIOGRAPHY OF ABDOMINAL AORTA WITH ILIOFEMORAL RUNOFF TECHNIQUE: Multidetector CT imaging of the abdomen, pelvis and lower extremities was performed using the standard protocol during bolus administration of intravenous contrast. Multiplanar CT image reconstructions and MIPs were obtained to evaluate the vascular anatomy. RADIATION DOSE REDUCTION: This exam was performed according to the departmental dose-optimization program which includes automated exposure control, adjustment of the mA and/or kV according to patient size and/or use of iterative reconstruction technique. CONTRAST:  OMNIPAQUE IOHEXOL 350 MG/ML SOLN COMPARISON:  10/20/2021 FINDINGS: VASCULAR Aorta: Normal caliber aorta without aneurysm, dissection, vasculitis or significant stenosis. Moderate atherosclerosis. Celiac: Stable mild stenosis at the origin of the celiac artery. No aneurysm, dissection, or vasculitis. SMA: Patent without evidence of aneurysm, dissection, vasculitis or significant stenosis. Mild atheromatous plaque at the origin of the SMA unchanged. Renals: There are 2 right-sided renal arteries, again with high-grade stenosis of the more inferior right renal artery. There is a left pelvic kidney, with 2 left renal arteries identified. The more superior artery arises from the ventral margin of the distal aorta and the more inferior artery arises from the right common iliac artery. Stable atherosclerosis without high-grade stenosis. No evidence of aneurysm, dissection, vasculitis, or fibromuscular dysplasia. IMA: Patent without evidence of aneurysm, dissection, vasculitis or significant stenosis. RIGHT Lower Extremity Inflow: Chronic atherosclerosis of the right common iliac and external iliac arteries without evidence of high-grade stenosis.  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 No evidence of aneurysm or dissection. Outflow: Interval right femoral-popliteal bypass graft. 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 The bypass graft appears patent, though the distal aspect is not well evaluated due to imaging out pacing the contrast bolus. There is complete occlusion of the native right superficial femoral artery. The native right profundus femoral artery is patent. Runoff: Trifurcation vessels cannot be evaluated due to timing of the contrast bolus, with the scanner out pacing the bolus beyond the level of the distal thigh. LEFT Lower Extremity Inflow: Chronic atherosclerosis of the left common iliac and internal iliac arteries without high-grade stenosis. 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 No evidence of aneurysm or dissection. Outflow: There is reconstitution of the left common femoral artery at the level of the bifurcation via collaterals from the left internal iliac. The left superficial femoral and profundus femoral arteries are mildly decreasing caliber, bladder widely patent within their proximal and mid extends. There is segmental occlusion of the left SFA as it exits Hunter's canal, new since prior study, extending approximately 5 cm in length reference image 89/8. There is reconstitution of  the distal left SFA and popliteal arteries via profundus femoral collaterals. No evidence of aneurysm or dissection. Runoff: The left trifurcation vessels are diminutive but patent through the level of the ankle. Veins: No obvious venous abnormality within the limitations of this arterial phase study. Review of the MIP images confirms the above findings. NON-VASCULAR Lower chest: Areas of subpleural linear consolidation within the anterior left lower lobe and right middle lobe likely reflect subsegmental atelectasis. There is nodular  subpleural consolidation within the medial left lower lobe measuring 8 mm reference image 16/4, indeterminate. Hepatobiliary: No focal liver abnormality is seen. No gallstones, gallbladder wall thickening, or biliary dilatation. Pancreas: Unremarkable. No pancreatic ductal dilatation or surrounding inflammatory changes. Spleen: Normal in size without focal abnormality. Adrenals/Urinary Tract: Left pelvic kidney. Mild bilateral renal cortical thinning. No urinary tract calculi or obstructive uropathy within either kidney. Stable appearance of the adrenal glands. Mild bladder wall thickening is likely due to under distension. Stomach/Bowel: No bowel obstruction no bowel obstruction or ileus. Right inguinal hernia contains multiple loops of bowel without evidence of incarceration or obstruction. No bowel wall thickening or inflammatory change. Lymphatic: No pathologic adenopathy within the abdomen or pelvis. Reproductive: Stable appearance of the prostate. Other: No free fluid or free intraperitoneal gas. Right inguinal hernia containing multiple loops of small bowel, stable. No incarceration or obstruction. Musculoskeletal: There are no acute displaced fractures. Evidence of prior amputation of the first digit at the level of the metatarsophalangeal joint. No CT evidence of osteomyelitis within the area of concern at the left fifth digit. Reconstructed images demonstrate no additional findings. IMPRESSION: VASCULAR 1. 5 cm segmental occlusion of the distal left SFA, new since prior study. There is distal reconstitution of the left SFA and popliteal artery via collaterals from the profundus femoral system. The left popliteal artery and trifurcation vessels are diminutive, but patent through the level of the ankle. 2. 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 Imaging out paces the contrast bolus beyond the mid right thigh, limiting  evaluation for patency of the distal aspect of the bypass graft and right lower extremity runoff vessels. 3. 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 4. 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 5.  Aortic Atherosclerosis (ICD10-I70.0). NON-VASCULAR 1. Areas of subpleural consolidation within the lung bases, with slightly nodular appearance in the medial left lower lobe, favor subsegmental atelectasis. Follow-up may be warranted to exclude pulmonary nodule. 2. Stable right inguinal hernia containing multiple loops of distal small bowel. No incarceration or obstruction. 3. Left pelvic kidney. Electronically Signed   By: Sharlet Salina M.D.   On: 06/18/2023 17:03    Scheduled Meds:  amLODipine  10 mg Oral Daily   folic acid  1 mg Oral Daily   heparin  5,000 Units Subcutaneous Once   multivitamin with minerals  1 tablet Oral Daily   thiamine  100 mg Oral Daily   Or   thiamine  100 mg Intravenous Daily   Continuous Infusions:   LOS: 2 days   Burnadette Pop, MD [facility] P1/23/2025, 11:10 AM

## 2023-06-20 NOTE — Progress Notes (Signed)
Patient brought to 4E from cath lab. VSS. Telemetry box applied, CCMD notified. Patient oriented to room and staff. Call bell in reach. Patient educated on importance of bedrest. Patient repeatedly moving, not following bedrest instructions.   Kenard Gower, RN

## 2023-06-21 ENCOUNTER — Other Ambulatory Visit (HOSPITAL_COMMUNITY): Payer: Self-pay

## 2023-06-21 ENCOUNTER — Telehealth: Payer: Self-pay

## 2023-06-21 ENCOUNTER — Other Ambulatory Visit: Payer: Self-pay

## 2023-06-21 ENCOUNTER — Encounter (HOSPITAL_COMMUNITY): Payer: Self-pay | Admitting: Vascular Surgery

## 2023-06-21 DIAGNOSIS — I70223 Atherosclerosis of native arteries of extremities with rest pain, bilateral legs: Secondary | ICD-10-CM

## 2023-06-21 DIAGNOSIS — I998 Other disorder of circulatory system: Secondary | ICD-10-CM | POA: Diagnosis not present

## 2023-06-21 DIAGNOSIS — I70245 Atherosclerosis of native arteries of left leg with ulceration of other part of foot: Secondary | ICD-10-CM | POA: Diagnosis not present

## 2023-06-21 LAB — LIPID PANEL
Cholesterol: 101 mg/dL (ref 0–200)
HDL: 19 mg/dL — ABNORMAL LOW (ref >40)
LDL Cholesterol: 65 mg/dL (ref 0–99)
Total CHOL/HDL Ratio: 5.3 {ratio}
Triglycerides: 86 mg/dL (ref <150)
VLDL: 17 mg/dL (ref 0–40)

## 2023-06-21 LAB — CBC WITH DIFFERENTIAL/PLATELET
Abs Immature Granulocytes: 0.04 10*3/uL (ref 0.00–0.07)
Basophils Absolute: 0 10*3/uL (ref 0.0–0.1)
Basophils Relative: 1 %
Eosinophils Absolute: 0 10*3/uL (ref 0.0–0.5)
Eosinophils Relative: 1 %
HCT: 28.7 % — ABNORMAL LOW (ref 39.0–52.0)
Hemoglobin: 9.3 g/dL — ABNORMAL LOW (ref 13.0–17.0)
Immature Granulocytes: 3 %
Lymphocytes Relative: 56 %
Lymphs Abs: 0.9 10*3/uL (ref 0.7–4.0)
MCH: 27.9 pg (ref 26.0–34.0)
MCHC: 32.4 g/dL (ref 30.0–36.0)
MCV: 86.2 fL (ref 80.0–100.0)
Monocytes Absolute: 0.1 10*3/uL (ref 0.1–1.0)
Monocytes Relative: 7 %
Neutro Abs: 0.5 10*3/uL — ABNORMAL LOW (ref 1.7–7.7)
Neutrophils Relative %: 32 %
Platelets: 429 10*3/uL — ABNORMAL HIGH (ref 150–400)
RBC: 3.33 MIL/uL — ABNORMAL LOW (ref 4.22–5.81)
RDW: 13.8 % (ref 11.5–15.5)
WBC: 1.5 10*3/uL — ABNORMAL LOW (ref 4.0–10.5)
nRBC: 0 % (ref 0.0–0.2)

## 2023-06-21 LAB — PATHOLOGIST SMEAR REVIEW

## 2023-06-21 MED ORDER — IRON 325 (65 FE) MG PO TABS
1.0000 | ORAL_TABLET | Freq: Every day | ORAL | 0 refills | Status: DC
  Filled 2023-06-21: qty 60, 60d supply, fill #0

## 2023-06-21 MED ORDER — NICOTINE 21 MG/24HR TD PT24
21.0000 mg | MEDICATED_PATCH | Freq: Every day | TRANSDERMAL | 0 refills | Status: AC
  Filled 2023-06-21: qty 28, 28d supply, fill #0

## 2023-06-21 MED ORDER — AMLODIPINE BESYLATE 10 MG PO TABS
10.0000 mg | ORAL_TABLET | Freq: Every day | ORAL | 0 refills | Status: DC
  Filled 2023-06-21: qty 60, 60d supply, fill #0

## 2023-06-21 MED ORDER — IBUPROFEN 600 MG PO TABS
600.0000 mg | ORAL_TABLET | Freq: Four times a day (QID) | ORAL | 0 refills | Status: DC | PRN
  Filled 2023-06-21: qty 30, 8d supply, fill #0

## 2023-06-21 MED ORDER — ATORVASTATIN CALCIUM 40 MG PO TABS
40.0000 mg | ORAL_TABLET | Freq: Every day | ORAL | 0 refills | Status: DC
  Filled 2023-06-21: qty 60, 60d supply, fill #0

## 2023-06-21 MED ORDER — ATORVASTATIN CALCIUM 40 MG PO TABS: 40.0000 mg | ORAL_TABLET | Freq: Every day | ORAL | Status: DC

## 2023-06-21 MED ORDER — DOXYCYCLINE HYCLATE 100 MG PO TABS
100.0000 mg | ORAL_TABLET | Freq: Two times a day (BID) | ORAL | 0 refills | Status: DC
  Filled 2023-06-21: qty 14, 7d supply, fill #0

## 2023-06-21 MED ORDER — ASPIRIN 81 MG PO TBEC
81.0000 mg | DELAYED_RELEASE_TABLET | Freq: Every day | ORAL | 0 refills | Status: DC
  Filled 2023-06-21: qty 60, 60d supply, fill #0

## 2023-06-21 MED ORDER — IBUPROFEN 400 MG PO TABS: 600.0000 mg | ORAL_TABLET | Freq: Four times a day (QID) | ORAL | Status: DC | PRN

## 2023-06-21 NOTE — Discharge Summary (Addendum)
Physician Discharge Summary  Paul Madden MWN:[id number] DOB: 09-13-1955 DOA: 06/18/2023  PCP: Anabel Halon, MD  Admit date: 06/18/2023 Discharge date: 06/21/2023  Admitted From: Home Disposition:  Home  Discharge Condition:Stable CODE STATUS:FULL Diet recommendation: Heart Healthy  Brief/Interim Summary: Patient is a 68 year old male with history of hypertension, peripheral artery disease, alcohol use, polysubstance abuse who presented with intermittent pain, dark discoloration of left fifth toe at [facility]. On presentation, he had low-grade fever. Lab work showed leukopenia. CT angiogram showed 5 cm segmental occlusion of the distal left SFA. Case discussed with vascular surgery. Patient transferred here for vascular intervention.  Status post diagnostic angiogram.  Vascular surgery recommending left common femoral artery endarterectomy with retrograde iliac stenting and antegrade SFA stenting as an outpatient next week with Dr. Randie Heinz.  Vascular surgery will call him for appointment.  Patient deperate to go home today.  Medically stable for discharge  Following problems were addressed during the hospitalization:  Peripheral disease/ischemia of left foot: History of peripheral artery disease. 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  Status post amputation of right great toe.  Presented with left fifth toe discoloration, intermittent pain.  No history of diabetes.  CT angiogram showed 5 cm segmental occlusion of the distal left SFA.  Case discussed with vascular surgery.  Patient transferred here for vascular intervention. No sign of infection, antibiotics on hold. A1c 5.2, LDL of 65.  Home medications show Lipitor, not sure he was taking those.  Status post diagnostic angiogram.  Vascular surgery recommending left common femoral artery endarterectomy with retrograde iliac stenting and antegrade SFA stenting as an outpatient next week with Dr.  Randie Heinz.  Vascular surgery will call him for appointment.   Continue aspirin and Lipitor on discharge. Also started on 7 days course of doxycycline for left small toe gangrene.  Patient does not have any signs of systemic infection  Leukopenia/normocytic anemia: Unclear etiology.  WBC of 1.5.  HIV nonreactive.  Patient does not look septic.  Afebrile.  Most likely needs outpatient workup.  Will refer to hematology , message sent to Dr. Ellin Saba.  Also provided ambulatory referral.  Chronic alcohol abuse: Currently not in withdrawal.  Drinks beer   Tobacco abuse: Smokes a pack a day.  Counseled for cessation.  Prescribed nicotine patch   Hypertension: On Norvasc.  Currently blood pressure stable   Substance abuse: UDS positive for opiates, cocaine.Counselled for cessation   Discharge Diagnoses:  Principal Problem:   Ischemia of toe Active Problems:   Alcohol use   Essential hypertension   Idiopathic peripheral neuropathy   PAD (peripheral artery disease) [facility])    Discharge Instructions  Discharge Instructions     Ambulatory referral to Hematology / Oncology   Complete by: As directed    For leucopenia   Diet - low sodium heart healthy   Complete by: As directed    Discharge instructions   Complete by: As directed    1)Please take prescribed medications as instructed 2)Follow up with vascular surgery.  You will be called for appointment 3)Follow up with hematology as an outpatient.  You have an appointment on 1/27 with Dr Ellin Saba.   4)Follow up with your PCP in a week.  Do a CBC test to check your WBC count   Increase activity slowly   Complete by: As directed       Allergies as of 06/21/2023   No Known Allergies      Medication List  STOP taking these medications    clindamycin 150 MG capsule Commonly known as: CLEOCIN       TAKE these medications    amLODipine 10 MG tablet Commonly known as: NORVASC Take 1 tablet (10 mg total) by mouth daily.    aspirin EC 81 MG tablet Take 1 tablet (81 mg total) by mouth daily. Swallow whole.   atorvastatin 40 MG tablet Commonly known as: LIPITOR Take 1 tablet (40 mg total) by mouth at bedtime. What changed:  medication strength how much to take   doxycycline 100 MG tablet Commonly known as: VIBRA-TABS Take 1 tablet (100 mg total) by mouth 2 (two) times daily.   ibuprofen 600 MG tablet Commonly known as: ADVIL Take 1 tablet (600 mg total) by mouth every 6 (six) hours as needed for moderate pain (pain score 4-6).   Iron 325 (65 Fe) MG Tabs Take 1 tablet (325 mg total) by mouth daily.   nicotine 21 mg/24hr patch Commonly known as: NICODERM CQ - dosed in mg/24 hours Place 1 patch (21 mg total) onto the skin daily.   polyethylene glycol powder 17 GM/SCOOP powder Commonly known as: GLYCOLAX/MIRALAX Take 17 g by mouth daily.        Follow-up Information     Doreatha Massed, MD. Schedule an appointment as soon as possible for a visit in 2 week(s).   Specialty: Hematology Contact information: [address] [place] [address] [phone]         Anabel Halon, MD. Schedule an appointment as soon as possible for a visit in 1 week(s).   Specialty: Internal Medicine Contact information: [address] [place] [address] [phone]                No Known Allergies  Consultations: Vascular surgery   Procedures/Studies: PERIPHERAL VASCULAR CATHETERIZATION Result Date: 06/21/2023 Images from the original result were not included.   Patient name: Paul Madden    MRN: [id number]        DOB: February 25, 1956          Sex: male  06/20/2023 Pre-operative Diagnosis: Critical limb ischemia of the left lower extremity with tissue loss Post-operative diagnosis:  Same Surgeon:  Cephus Shelling, MD Procedure Performed: 1.  Ultrasound-guided access right common femoral artery 2.  Aortogram with catheter selection of abdominal aorta 3.  Left lower extremity arteriogram  from catheter in abdominal aorta 4.  21 minutes of monitored moderate conscious sedation time  Indications: Patient is a 68 year old male that has previously undergone right leg revascularization.  Now with evidence of left fifth toe wound ongoing for months.  He presents for lower extremity arteriogram mostly for evaluation of distal target given known left external iliac and common femoral artery occlusion on CT.  Risks benefits discussed.  Findings:  Ultrasound-guided access right common femoral artery.  Abdominal aortogram showed patent infrarenal aorta.  The right renal artery has a high-grade stenosis.  The left common iliac artery has about a 50% stenosis proximally from calcified plaque.  The hypogastric on the left is patent with an occluded external and common femoral.  The distal common femoral reconstitutes with a patent profunda and SFA.  The SFA is patent except for mid to distal SFA has a short 5 cm occlusion.  Distally there is a patent popliteal with three-vessel runoff.  AT/PT are dominant flow into the foot.             Procedure:  The patient was identified in  the holding area and taken to room 8.  The patient was then placed supine on the table and prepped and draped in the usual sterile fashion.  A time out was called.  Patient received Versed and fentanyl for conscious moderate sedation.  Vital signs were monitored including heart rate, respiratory rate, oxygenation and blood pressure.  I was present for all of moderate sedation.  Ultrasound was used to evaluate the right common femoral artery.  It was patent .  A digital ultrasound image was acquired.  A micropuncture needle was used to access the right common femoral artery under ultrasound guidance.  An 018 wire was advanced without resistance and a micropuncture sheath was placed.  The 018 wire was removed and a benson wire was placed.  The micropuncture sheath was exchanged for a 5 french sheath.  An omniflush catheter was advanced over  the wire to the level of L-1.  An abdominal angiogram was obtained.  Given left iliac occlusion I left the catheter in the abdominal aorta.  We then got staged imaging of the left lower extremity for identification of distal targets.  Findings are noted above.  Wires and catheters were removed.  The 5 French sheath in the right groin was removed while holding pressure.  Plan: Patient will need left common femoral endarterectomy with retrograde iliac stenting and likely antegrade stenting as wel for SFA diseasel.  Will post for the OR with Dr. Randie Heinz in the near future.  Likely discharge in the interim.   Cephus Shelling, MD [facility] Office: [phone]   VAS [place] ABI WITH/WO TBI Result Date: 06/19/2023  LOWER EXTREMITY DOPPLER STUDY Patient Name:  Paul Madden  Date of Exam:   06/19/2023 Medical Rec #: [id number]    Accession #:    [id number] Date of Birth: 12/29/1955    Patient Gender: M Patient Age:   24 years Exam Location:  [facility] Procedure:      VAS [place] ABI WITH/WO TBI Referring Phys: Lemar Livings --------------------------------------------------------------------------------  Indications: Claudication, gangrene, and peripheral artery disease. ischemic dry              gangrene to the left 5th toe  Vascular Interventions: S/P right common femoral endarterectomy, right fem BK                         popliteal bypass with vein and right great toe                         amputation on 10/24/21. Performing Technologist: Marilynne Halsted RDMS, RVT  Examination Guidelines: A complete evaluation includes at minimum, Doppler waveform signals and systolic blood pressure reading at the level of bilateral brachial, anterior tibial, and posterior tibial arteries, when vessel segments are accessible. Bilateral testing is considered an integral part of a complete examination. Photoelectric Plethysmograph (PPG) waveforms and toe systolic pressure readings are included as required  and additional duplex testing as needed. Limited examinations for reoccurring indications may be performed as noted.  ABI Findings: +---------+------------------+-----+---------+---------+ Right    Rt Pressure (mmHg)IndexWaveform Comment   +---------+------------------+-----+---------+---------+ Brachial 118                    triphasic          +---------+------------------+-----+---------+---------+ PTA      125               1.02 triphasic          +---------+------------------+-----+---------+---------+  DP       125               1.02 triphasic          +---------+------------------+-----+---------+---------+ Great Toe58                0.47 Abnormal 2nd digit +---------+------------------+-----+---------+---------+ +---------+------------------+-----+----------+-------+ Left     Lt Pressure (mmHg)IndexWaveform  Comment +---------+------------------+-----+----------+-------+ Brachial 123                    triphasic         +---------+------------------+-----+----------+-------+ PTA      58                0.47 monophasic        +---------+------------------+-----+----------+-------+ DP       63                0.51 monophasic        +---------+------------------+-----+----------+-------+ Great Toe0                 0.00 Absent            +---------+------------------+-----+----------+-------+ +-------+-----------+-----------+------------+------------+ ABI/TBIToday's ABIToday's TBIPrevious ABIPrevious TBI +-------+-----------+-----------+------------+------------+ Right  1.02                  0                        +-------+-----------+-----------+------------+------------+ Left   0.51       absent     0.65        0.05         +-------+-----------+-----------+------------+------------+  Summary: Right: Resting right ankle-brachial index is within normal range. Left: Resting left ankle-brachial index indicates moderate left lower  extremity arterial disease. *See table(s) above for measurements and observations.  Electronically signed by Coral Else MD on 06/19/2023 at 7:27:26 PM.    Final    VAS [place] LOWER EXTREMITY SAPHENOUS VEIN MAPPING Result Date: 06/19/2023 LOWER EXTREMITY VEIN MAPPING Patient Name:  Paul Madden  Date of Exam:   06/19/2023 Medical Rec #: [id number]    Accession #:    [id number] Date of Birth: [birth year]    Patient Gender: M Patient Age:   58 years Exam Location:  [facility] Procedure:      VAS [place] LOWER EXTREMITY SAPHENOUS VEIN MAPPING Referring Phys: Lemar Livings --------------------------------------------------------------------------------  Indications:        Pre-op Other Indications:  ischemic dry gangrene to the left 5th toe Risk Factors:       Hypertension, current smoker, PAD. Other Risk Factors: Amutation right grerat toe 10/24/21.  Performing Technologist: Marilynne Halsted RDMS, RVT  Examination Guidelines: A complete evaluation includes B-mode imaging, spectral Doppler, color Doppler, and power Doppler as needed of all accessible portions of each vessel. Bilateral testing is considered an integral part of a complete examination. Limited examinations for reoccurring indications may be performed as noted. +---------------+-----------+----------------------+---------------+-----------+   RT Diameter  RT Findings         GSV            LT Diameter  LT Findings      (cm)                                            (cm)                  +---------------+-----------+----------------------+---------------+-----------+  Saphenofemoral         0.39                                                   Junction                                  +---------------+-----------+----------------------+---------------+-----------+                               Proximal thigh         0.36                   +---------------+-----------+----------------------+---------------+-----------+                                 Mid thigh            0.36       branching  +---------------+-----------+----------------------+---------------+-----------+                                Distal thigh          0.35                  +---------------+-----------+----------------------+---------------+-----------+                                    Knee              0.27                  +---------------+-----------+----------------------+---------------+-----------+                                 Prox calf            0.20                  +---------------+-----------+----------------------+---------------+-----------+                                  Mid calf            0.22                  +---------------+-----------+----------------------+---------------+-----------+                                Distal calf           0.18                  +---------------+-----------+----------------------+---------------+-----------+                                   Ankle              0.20                  +---------------+-----------+----------------------+---------------+-----------+ Diagnosing physician: Faylene Million  Brabham MD Electronically signed by Coral Else MD on 06/19/2023 at 7:26:51 PM.    Final    CT Angio Aortobifemoral W and/or Wo Contrast Result Date: 06/18/2023 CLINICAL DATA:  Left fifth toe pain, loss of nail EXAM: CT ANGIOGRAPHY OF ABDOMINAL AORTA WITH ILIOFEMORAL RUNOFF TECHNIQUE: Multidetector CT imaging of the abdomen, pelvis and lower extremities was performed using the standard protocol during bolus administration of intravenous contrast. Multiplanar CT image reconstructions and MIPs were obtained to evaluate the vascular anatomy. RADIATION DOSE REDUCTION: This exam was performed according to the departmental dose-optimization program which includes automated exposure control,  adjustment of the mA and/or kV according to patient size and/or use of iterative reconstruction technique. CONTRAST:  OMNIPAQUE IOHEXOL 350 MG/ML SOLN COMPARISON:  10/20/2021 FINDINGS: VASCULAR Aorta: Normal caliber aorta without aneurysm, dissection, vasculitis or significant stenosis. Moderate atherosclerosis. Celiac: Stable mild stenosis at the origin of the celiac artery. No aneurysm, dissection, or vasculitis. SMA: Patent without evidence of aneurysm, dissection, vasculitis or significant stenosis. Mild atheromatous plaque at the origin of the SMA unchanged. Renals: There are 2 right-sided renal arteries, again with high-grade stenosis of the more inferior right renal artery. There is a left pelvic kidney, with 2 left renal arteries identified. The more superior artery arises from the ventral margin of the distal aorta and the more inferior artery arises from the right common iliac artery. Stable atherosclerosis without high-grade stenosis. No evidence of aneurysm, dissection, vasculitis, or fibromuscular dysplasia. IMA: Patent without evidence of aneurysm, dissection, vasculitis or significant stenosis. RIGHT Lower Extremity Inflow: Chronic atherosclerosis of the right common iliac and external iliac arteries without evidence of high-grade stenosis. 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 No evidence of aneurysm or dissection. Outflow: Interval right femoral-popliteal bypass graft. 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 The bypass graft appears patent, though the distal aspect is not well evaluated due to imaging out pacing the contrast bolus. There is complete occlusion of the native right superficial femoral artery. The native right profundus femoral artery is patent. Runoff: Trifurcation vessels cannot be evaluated due to timing of the contrast bolus, with the scanner out pacing the bolus beyond the level  of the distal thigh. LEFT Lower Extremity Inflow: Chronic atherosclerosis of the left common iliac and internal iliac arteries without high-grade stenosis. 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 No evidence of aneurysm or dissection. Outflow: There is reconstitution of the left common femoral artery at the level of the bifurcation via collaterals from the left internal iliac. The left superficial femoral and profundus femoral arteries are mildly decreasing caliber, bladder widely patent within their proximal and mid extends. There is segmental occlusion of the left SFA as it exits Hunter's canal, new since prior study, extending approximately 5 cm in length reference image 89/8. There is reconstitution of the distal left SFA and popliteal arteries via profundus femoral collaterals. No evidence of aneurysm or dissection. Runoff: The left trifurcation vessels are diminutive but patent through the level of the ankle. Veins: No obvious venous abnormality within the limitations of this arterial phase study. Review of the MIP images confirms the above findings. NON-VASCULAR Lower chest: Areas of subpleural linear consolidation within the anterior left lower lobe and right middle lobe likely reflect subsegmental atelectasis. There is nodular subpleural consolidation within the medial left lower lobe measuring 8 mm reference image 16/4, indeterminate. Hepatobiliary: No focal liver abnormality is seen.  No gallstones, gallbladder wall thickening, or biliary dilatation. Pancreas: Unremarkable. No pancreatic ductal dilatation or surrounding inflammatory changes. Spleen: Normal in size without focal abnormality. Adrenals/Urinary Tract: Left pelvic kidney. Mild bilateral renal cortical thinning. No urinary tract calculi or obstructive uropathy within either kidney. Stable appearance of the adrenal glands. Mild bladder wall thickening is likely due to under distension. Stomach/Bowel: No bowel obstruction no  bowel obstruction or ileus. Right inguinal hernia contains multiple loops of bowel without evidence of incarceration or obstruction. No bowel wall thickening or inflammatory change. Lymphatic: No pathologic adenopathy within the abdomen or pelvis. Reproductive: Stable appearance of the prostate. Other: No free fluid or free intraperitoneal gas. Right inguinal hernia containing multiple loops of small bowel, stable. No incarceration or obstruction. Musculoskeletal: There are no acute displaced fractures. Evidence of prior amputation of the first digit at the level of the metatarsophalangeal joint. No CT evidence of osteomyelitis within the area of concern at the left fifth digit. Reconstructed images demonstrate no additional findings. IMPRESSION: VASCULAR 1. 5 cm segmental occlusion of the distal left SFA, new since prior study. There is distal reconstitution of the left SFA and popliteal artery via collaterals from the profundus femoral system. The left popliteal artery and trifurcation vessels are diminutive, but patent through the level of the ankle. 2. 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 Imaging out paces the contrast bolus beyond the mid right thigh, limiting evaluation for patency of the distal aspect of the bypass graft and right lower extremity runoff vessels. 3. 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 4. 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 5.  Aortic Atherosclerosis (ICD10-I70.0). NON-VASCULAR 1. Areas of subpleural consolidation within the lung bases, with slightly nodular appearance in the medial left lower lobe, favor subsegmental atelectasis. Follow-up may be warranted to exclude pulmonary nodule. 2. Stable right inguinal hernia  containing multiple loops of distal small bowel. No incarceration or obstruction. 3. Left pelvic kidney. Electronically Signed   By: Sharlet Salina M.D.   On: 06/18/2023 17:03      Subjective: Patient seen and examined at bedside today.  Hemodynamically stable.  Comfortable.  Really wants to go home today.  We explained about the discharge planning.  He should follow-up with hematology as an outpatient as well as his PCP.  He will be called for follow-up by vascular surgery  Discharge Exam: Vitals:   06/21/23 0759 06/21/23 0950  BP: 125/73 125/73  Pulse: 79   Resp: 17   Temp: 99 F (37.2 C)   SpO2: 96%    Vitals:   06/20/23 2338 06/21/23 0333 06/21/23 0759 06/21/23 0950  BP: 139/87 128/83 125/73 125/73  Pulse: 85 66 79   Resp: 19 17 17    Temp: 98.4 F (36.9 C) 98.3 F (36.8 C) 99 F (37.2 C)   TempSrc: Oral Oral    SpO2: 93% 97% 96%   Weight:      Height:        General: Pt is alert, awake, not in acute distress Cardiovascular: RRR, S1/S2 +, no rubs, no gallops Respiratory: CTA bilaterally, no wheezing, no rhonchi Abdominal: Soft, NT, ND, bowel sounds + Extremities: no edema, no cyanosis, amputation of right great toe, dry gangrene of small left    The results of significant diagnostics  from this hospitalization (including imaging, microbiology, ancillary and laboratory) are listed below for reference.     Microbiology: Recent Results (from the past 240 hours)  Blood culture (routine x 2)     Status: None (Preliminary result)   Collection Time: 06/18/23  5:27 PM   Specimen: BLOOD RIGHT FOREARM  Result Value Ref Range Status   Specimen Description BLOOD RIGHT FOREARM  Final   Special Requests   Final    BOTTLES DRAWN AEROBIC AND ANAEROBIC Blood Culture results may not be optimal due to an inadequate volume of blood received in culture bottles   Culture   Final    NO GROWTH 3 DAYS Performed at [facility], [address], [place], [place] [address]    Report  Status PENDING  Incomplete  Blood culture (routine x 2)     Status: None (Preliminary result)   Collection Time: 06/18/23  5:27 PM   Specimen: Right Antecubital; Blood  Result Value Ref Range Status   Specimen Description RIGHT ANTECUBITAL  Final   Special Requests   Final    BOTTLES DRAWN AEROBIC AND ANAEROBIC Blood Culture results may not be optimal due to an inadequate volume of blood received in culture bottles   Culture   Final    NO GROWTH 3 DAYS Performed at [facility], [address] [place], [place] [address]    Report Status PENDING  Incomplete     Labs: BNP (last 3 results) No results for input(s): "BNP" in the last 8760 hours. Basic Metabolic Panel: Recent Labs  Lab 06/18/23 1555 06/19/23 0420  NA 137 138  K 4.2 4.6  CL 103 106  CO2 25 29  GLUCOSE 95 97  BUN 16 15  CREATININE 1.12 1.11  CALCIUM 8.3* 8.5*  MG  --  2.1  PHOS  --  4.1   Liver Function Tests: No results for input(s): "AST", "ALT", "ALKPHOS", "BILITOT", "PROT", "ALBUMIN" in the last 168 hours. No results for input(s): "LIPASE", "AMYLASE" in the last 168 hours. No results for input(s): "AMMONIA" in the last 168 hours. CBC: Recent Labs  Lab 06/18/23 1555 06/19/23 0420 06/20/23 0505 06/21/23 0324  WBC 1.4* 1.6* 1.4* 1.5*  NEUTROABS 0.5*  --   --  0.5*  HGB 10.3* 9.7* 9.8* 9.3*  HCT 32.6* 30.8* 30.7* 28.7*  MCV 87.4 86.0 85.8 86.2  PLT 392 374 395 429*   Cardiac Enzymes: No results for input(s): "CKTOTAL", "CKMB", "CKMBINDEX", "TROPONINI" in the last 168 hours. BNP: Invalid input(s): "POCBNP" CBG: No results for input(s): "GLUCAP" in the last 168 hours. D-Dimer No results for input(s): "DDIMER" in the last 72 hours. Hgb A1c Recent Labs    06/19/23 0420  HGBA1C 5.2   Lipid Profile Recent Labs    06/20/23 0505 06/21/23 0324  CHOL 101 101  HDL 16* 19*  LDLCALC 65 65  TRIG 101 86  CHOLHDL 6.3 5.3   Thyroid function studies No results for input(s): "TSH", "T4TOTAL",  "T3FREE", "THYROIDAB" in the last 72 hours.  Invalid input(s): "FREET3" Anemia work up No results for input(s): "VITAMINB12", "FOLATE", "FERRITIN", "TIBC", "IRON", "RETICCTPCT" in the last 72 hours. Urinalysis    Component Value Date/Time   COLORURINE YELLOW 10/23/2021 0407   APPEARANCEUR CLEAR 10/23/2021 0407   LABSPEC 1.024 10/23/2021 0407   PHURINE 6.0 10/23/2021 0407   GLUCOSEU NEGATIVE 10/23/2021 0407   HGBUR NEGATIVE 10/23/2021 0407   BILIRUBINUR NEGATIVE 10/23/2021 0407   KETONESUR NEGATIVE 10/23/2021 0407   PROTEINUR NEGATIVE 10/23/2021 0407  NITRITE NEGATIVE 10/23/2021 0407   LEUKOCYTESUR NEGATIVE 10/23/2021 0407   Sepsis Labs Recent Labs  Lab 06/18/23 1555 06/19/23 0420 06/20/23 0505 06/21/23 0324  WBC 1.4* 1.6* 1.4* 1.5*   Microbiology Recent Results (from the past 240 hours)  Blood culture (routine x 2)     Status: None (Preliminary result)   Collection Time: 06/18/23  5:27 PM   Specimen: BLOOD RIGHT FOREARM  Result Value Ref Range Status   Specimen Description BLOOD RIGHT FOREARM  Final   Special Requests   Final    BOTTLES DRAWN AEROBIC AND ANAEROBIC Blood Culture results may not be optimal due to an inadequate volume of blood received in culture bottles   Culture   Final    NO GROWTH 3 DAYS Performed at [facility], [address], [place], [place] [address]    Report Status PENDING  Incomplete  Blood culture (routine x 2)     Status: None (Preliminary result)   Collection Time: 06/18/23  5:27 PM   Specimen: Right Antecubital; Blood  Result Value Ref Range Status   Specimen Description RIGHT ANTECUBITAL  Final   Special Requests   Final    BOTTLES DRAWN AEROBIC AND ANAEROBIC Blood Culture results may not be optimal due to an inadequate volume of blood received in culture bottles   Culture   Final    NO GROWTH 3 DAYS Performed at [facility], [address] [place], [place] [address]    Report Status PENDING  Incomplete    Please note: You  were cared for by a hospitalist during your hospital stay. Once you are discharged, your primary care physician will handle any further medical issues. Please note that NO REFILLS for any discharge medications will be authorized once you are discharged, as it is imperative that you return to your primary care physician (or establish a relationship with a primary care physician if you do not have one) for your post hospital discharge needs so that they can reassess your need for medications and monitor your lab values.    Time coordinating discharge: 40 minutes  SIGNED:   Burnadette Pop, MD  [facility] 06/21/2023, 12:05 PM Pager [phone]  If 7PM-7AM, please contact night-coverage [email or web address] Password [email or web address]

## 2023-06-21 NOTE — Telephone Encounter (Signed)
Spoke with pt and his daughter to schedule his surgery. I offered them a sooner surgery date but they declined as they did not want to alter their transportation arrangements.

## 2023-06-21 NOTE — Progress Notes (Signed)
PHARMACIST LIPID MONITORING   Paul Madden is a 68 y.o. male admitted on 06/18/2023 with PAD, intermittent toe pain - angiogram - needs endarterectomy and stent.   Pharmacy has been consulted to optimize lipid-lowering therapy with the indication of secondary prevention for clinical ASCVD.  Recent Labs:  Lipid Panel (last 6 months):   Lab Results  Component Value Date   CHOL 101 06/21/2023   TRIG 86 06/21/2023   HDL 19 (L) 06/21/2023   CHOLHDL 5.3 06/21/2023   VLDL 17 06/21/2023   LDLCALC 65 06/21/2023    Hepatic function panel (last 6 months):   No results found for: "AST", "ALT", "ALKPHOS", "BILITOT", "BILIDIR", "IBILI"  SCr (since admission):   Serum creatinine: 1.11 mg/dL 75/64/33 2951 Estimated creatinine clearance: 49.3 mL/min  Current therapy and lipid therapy tolerance Current lipid-lowering therapy: not taking    Assessment:   PAD with LDL 65 - med non-compliance  - will restart PTA atorvastatin at 40mg  every day  Educate patient regarding compliance  Leota Sauers Pharm.D. CPP, BCPS Clinical Pharmacist [phone] 06/21/2023 10:51 AM

## 2023-06-21 NOTE — Progress Notes (Addendum)
  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 Neurologic: A&O  CBC    Component Value Date/Time   WBC 1.5 (L) 06/21/2023 0324   RBC 3.33 (L) 06/21/2023 0324   HGB 9.3 (L) 06/21/2023 0324   HGB 9.1 (L) 11/01/2021 1542   HCT 28.7 (L) 06/21/2023 0324   HCT 28.4 (L) 11/01/2021 1542   PLT 429 (H) 06/21/2023 0324   PLT 790 (H) 11/01/2021 1542   MCV 86.2 06/21/2023 0324   MCV 89 11/01/2021 1542   MCH 27.9 06/21/2023 0324   MCHC 32.4 06/21/2023 0324   RDW 13.8 06/21/2023 0324   RDW 13.8 11/01/2021 1542   LYMPHSABS 0.9 06/21/2023 0324   LYMPHSABS 1.2 03/27/2021 1527   MONOABS 0.1 06/21/2023 0324   EOSABS 0.0 06/21/2023 0324   EOSABS 0.1 03/27/2021 1527   BASOSABS 0.0 06/21/2023 0324   BASOSABS 0.0 03/27/2021 1527    BMET    Component Value Date/Time   NA 138 06/19/2023 0420   NA 145 (H) 11/01/2021 1542   K 4.6 06/19/2023 0420   CL 106 06/19/2023 0420   CO2 29 06/19/2023 0420   GLUCOSE 97 06/19/2023 0420   BUN 15 06/19/2023 0420   BUN 19 11/01/2021 1542   CREATININE 1.11 06/19/2023 0420   CALCIUM 8.5 (L) 06/19/2023 0420   GFRNONAA >60 06/19/2023 0420    INR    Component Value Date/Time   INR 1.1 10/23/2021 1629     Intake/Output Summary (Last 24 hours) at 06/21/2023 0752 Last data filed at 06/21/2023 0518 Gross per 24 hour  Intake 886.66 ml  Output 425 ml  Net 461.66 ml     Assessment/Plan:  68 y.o. male is s/p diagnostic angiogram 1 Day Post-Op   R groin cath access site without hematoma Based on angiography, patient will require L CFA endarterectomy with retrograde iliac stenting and antegrade SFA stenting.  Tentatively plan for surgery  next week with Dr. Randie Heinz.  Patient can be discharged from vascular surgery standpoint and will be brought back for surgery.   Emilie Rutter, PA-C [facility] [phone] 06/21/2023 7:52 AM  I have seen and evaluated the patient. I agree with the PA note as documented above. Will schedule left CFA endarterectomy and retrograde left iliac stent and antegrade SFA stent as outpatient.  Can be discharged in the interim.  Cephus Shelling, MD [facility] [place] Office: [phone]

## 2023-06-21 NOTE — Care Management Important Message (Signed)
Important Message  Patient Details  Name: Paul Madden MRN: [id number] Date of Birth: 1955/12/01   Important Message Given:  Yes - Medicare IM     [name] 06/21/2023, 11:27 AM

## 2023-06-21 NOTE — Progress Notes (Signed)
While trying to give discharge instructions for the patient, pt was agitated and didn't want to listen and mentioned that "I can read the instructions, I am not a baby". Patient was in pain, he requested for tynolo so the meds give, pt's belongings provided, CCMD notified, TOC meds given, IV removed.

## 2023-06-21 NOTE — Progress Notes (Signed)
Paged the PA to inform about his family wants to talk with Dr.

## 2023-06-21 NOTE — Progress Notes (Signed)
Transition Care Management Unsuccessful Follow-up Telephone Call  Date of discharge and from where:  [facility] 1/20   Attempts:  1st Attempt  Reason for unsuccessful TCM follow-up call:  No answer/busy   Lenard Forth [facility] Guide, Phone: [phone] Fax: [phone] Website: [email or web address]

## 2023-06-23 LAB — CULTURE, BLOOD (ROUTINE X 2)
Culture: NO GROWTH
Culture: NO GROWTH

## 2023-06-23 NOTE — Progress Notes (Incomplete)
[name] [address], [place] [address]   Clinic Day:  06/23/2023  Referring physician: Anabel Halon, MD  Patient Care Team: Anabel Halon, MD as PCP - General (Internal Medicine)   ASSESSMENT & PLAN:   Assessment: ***  Plan: ***  No orders of the defined types were placed in this encounter.     I,Katie Daubenspeck,acting as a Neurosurgeon for Doreatha Massed, MD.,have documented all relevant documentation on the behalf of Doreatha Massed, MD,as directed by  Doreatha Massed, MD while in the presence of Doreatha Massed, MD.   ***  Katie Daubenspeck   1/26/202512:19 PM  CHIEF COMPLAINT/PURPOSE OF CONSULT:   Diagnosis: leukopenia and normocytic anemia   Current Therapy:  ***  HISTORY OF PRESENT ILLNESS:   Paul Madden is a 68 y.o. male presenting to clinic today for evaluation of leukopenia and normocytic anemia at the request of Dr. Renford Dills (hospitalist).  He was referred to the ED by his PCP due to intermittent pain and dark discoloration of his left 5th toe. He proceeded to the ED on 06/18/23. On admission, he was noted to have a low WBC count of 1.4. This low level persisted throughout his hospitalization, remaining low at 1.5 at time of discharge on 06/21/23. No prior history of low WBC in Epic.   Of note, he does have a history of normocytic anemia, with hemoglobin dropping to 9.3 by discharge from recent hospital admission.  Today, he states that he is doing well overall. His appetite level is at ***%. His energy level is at ***%.  PAST MEDICAL HISTORY:   Past Medical History: Past Medical History:  Diagnosis Date   Chronic low back pain    Hypertension     Surgical History: Past Surgical History:  Procedure Laterality Date   AMPUTATION TOE Right 10/24/2021   Procedure: AMPUTATION OF THE RIGHT GREAT TOE;  Surgeon: Maeola Harman, MD;  Location: [facility] OR;  Service: Vascular;  Laterality: Right;    FEMORAL-POPLITEAL BYPASS GRAFT Right 10/24/2021   Procedure: RIGHT BYPASS GRAFT COMMON FEMORAL-POPLITEAL ARTERY;  Surgeon: Maeola Harman, MD;  Location: [facility] OR;  Service: Vascular;  Laterality: Right;   LOWER EXTREMITY ANGIOGRAPHY N/A 06/20/2023   Procedure: Lower Extremity Angiography;  Surgeon: Cephus Shelling, MD;  Location: [facility] INVASIVE CV LAB;  Service: Cardiovascular;  Laterality: N/A;    Social History: Social History   Socioeconomic History   Marital status: Single    Spouse name: Not on file   Number of children: Not on file   Years of education: Not on file   Highest education level: Not on file  Occupational History   Not on file  Tobacco Use   Smoking status: Every Day   Smokeless tobacco: Never  Substance and Sexual Activity   Alcohol use: Yes    Comment: occ   Drug use: No   Sexual activity: Not on file  Other Topics Concern   Not on file  Social History Narrative   Not on file   Social Drivers of Health   Financial Resource Strain: Not on file  Food Insecurity: No Food Insecurity (06/18/2023)   Hunger Vital Sign    Worried About Running Out of Food in the Last Year: Never true    Ran Out of Food in the Last Year: Never true  Transportation Needs: No Transportation Needs (06/18/2023)   PRAPARE - Administrator, Civil Service (Medical): No    Lack of Transportation (  Non-Medical): No  Physical Activity: Not on file  Stress: Not on file  Social Connections: Socially Isolated (06/20/2023)   Social Connection and Isolation Panel [NHANES]    Frequency of Communication with Friends and Family: Twice a week    Frequency of Social Gatherings with Friends and Family: Twice a week    Attends Religious Services: Never    Database administrator or Organizations: No    Attends Banker Meetings: Never    Marital Status: Never married  Intimate Partner Violence: Not At Risk (06/18/2023)   Humiliation, Afraid, Rape, and Kick  questionnaire    Fear of Current or Ex-Partner: No    Emotionally Abused: No    Physically Abused: No    Sexually Abused: No    Family History: No family history on file.  Current Medications:  Current Outpatient Medications:    amLODipine (NORVASC) 10 MG tablet, Take 1 tablet (10 mg total) by mouth daily., Disp: 60 tablet, Rfl: 0   aspirin EC 81 MG tablet, Take 1 tablet (81 mg total) by mouth daily. Swallow whole., Disp: 60 tablet, Rfl: 0   atorvastatin (LIPITOR) 40 MG tablet, Take 1 tablet (40 mg total) by mouth at bedtime., Disp: 60 tablet, Rfl: 0   doxycycline (VIBRA-TABS) 100 MG tablet, Take 1 tablet (100 mg total) by mouth 2 (two) times daily., Disp: 14 tablet, Rfl: 0   Ferrous Sulfate (IRON) 325 (65 Fe) MG TABS, Take 1 tablet (325 mg total) by mouth daily., Disp: 60 tablet, Rfl: 0   ibuprofen (ADVIL) 600 MG tablet, Take 1 tablet (600 mg total) by mouth every 6 (six) hours as needed for moderate pain (pain score 4-6)., Disp: 30 tablet, Rfl: 0   nicotine (NICODERM CQ - DOSED IN MG/24 HOURS) 21 mg/24hr patch, Place 1 patch (21 mg total) onto the skin daily., Disp: 28 patch, Rfl: 0   polyethylene glycol powder (GLYCOLAX/MIRALAX) 17 GM/SCOOP powder, Take 17 g by mouth daily. (Patient not taking: Reported on 06/19/2023), Disp: 238 g, Rfl: 0   Allergies: No Known Allergies  REVIEW OF SYSTEMS:   Review of Systems  Constitutional:  Negative for chills, fatigue and fever.  HENT:   Negative for lump/mass, mouth sores, nosebleeds, sore throat and trouble swallowing.   Eyes:  Negative for eye problems.  Respiratory:  Negative for cough and shortness of breath.   Cardiovascular:  Negative for chest pain, leg swelling and palpitations.  Gastrointestinal:  Negative for abdominal pain, constipation, diarrhea, nausea and vomiting.  Genitourinary:  Negative for bladder incontinence, difficulty urinating, dysuria, frequency, hematuria and nocturia.   Musculoskeletal:  Negative for arthralgias,  back pain, flank pain, myalgias and neck pain.  Skin:  Negative for itching and rash.  Neurological:  Negative for dizziness, headaches and numbness.  Hematological:  Does not bruise/bleed easily.  Psychiatric/Behavioral:  Negative for depression, sleep disturbance and suicidal ideas. The patient is not nervous/anxious.   All other systems reviewed and are negative.    VITALS:   There were no vitals taken for this visit.  Wt Readings from Last 3 Encounters:  06/18/23 119 lb 0.8 oz (54 kg)  06/17/23 119 lb 0.8 oz (54 kg)  06/17/23 120 lb 0.6 oz (54.4 kg)    There is no height or weight on file to calculate BMI.   PHYSICAL EXAM:   Physical Exam Vitals and nursing note reviewed. Exam conducted with a chaperone present.  Constitutional:      Appearance: Normal appearance.  Cardiovascular:  Rate and Rhythm: Normal rate and regular rhythm.     Pulses: Normal pulses.     Heart sounds: Normal heart sounds.  Pulmonary:     Effort: Pulmonary effort is normal.     Breath sounds: Normal breath sounds.  Abdominal:     Palpations: Abdomen is soft. There is no hepatomegaly, splenomegaly or mass.     Tenderness: There is no abdominal tenderness.  Musculoskeletal:     Right lower leg: No edema.     Left lower leg: No edema.  Lymphadenopathy:     Cervical: No cervical adenopathy.     Right cervical: No superficial, deep or posterior cervical adenopathy.    Left cervical: No superficial, deep or posterior cervical adenopathy.     Upper Body:     Right upper body: No supraclavicular or axillary adenopathy.     Left upper body: No supraclavicular or axillary adenopathy.  Neurological:     General: No focal deficit present.     Mental Status: He is alert and oriented to person, place, and time.  Psychiatric:        Mood and Affect: Mood normal.        Behavior: Behavior normal.     LABS:   CBC    Component Value Date/Time   WBC 1.5 (L) 06/21/2023 0324   RBC 3.33 (L)  06/21/2023 0324   HGB 9.3 (L) 06/21/2023 0324   HGB 9.1 (L) 11/01/2021 1542   HCT 28.7 (L) 06/21/2023 0324   HCT 28.4 (L) 11/01/2021 1542   PLT 429 (H) 06/21/2023 0324   PLT 790 (H) 11/01/2021 1542   MCV 86.2 06/21/2023 0324   MCV 89 11/01/2021 1542   MCH 27.9 06/21/2023 0324   MCHC 32.4 06/21/2023 0324   RDW 13.8 06/21/2023 0324   RDW 13.8 11/01/2021 1542   LYMPHSABS 0.9 06/21/2023 0324   LYMPHSABS 1.2 03/27/2021 1527   MONOABS 0.1 06/21/2023 0324   EOSABS 0.0 06/21/2023 0324   EOSABS 0.1 03/27/2021 1527   BASOSABS 0.0 06/21/2023 0324   BASOSABS 0.0 03/27/2021 1527    CMP    Component Value Date/Time   NA 138 06/19/2023 0420   NA 145 (H) 11/01/2021 1542   K 4.6 06/19/2023 0420   CL 106 06/19/2023 0420   CO2 29 06/19/2023 0420   GLUCOSE 97 06/19/2023 0420   BUN 15 06/19/2023 0420   BUN 19 11/01/2021 1542   CREATININE 1.11 06/19/2023 0420   CALCIUM 8.5 (L) 06/19/2023 0420   PROT 6.9 10/23/2021 1629   PROT 7.3 03/27/2021 1527   ALBUMIN 3.2 (L) 10/23/2021 1629   ALBUMIN 4.7 03/27/2021 1527   AST 28 10/23/2021 1629   ALT 17 10/23/2021 1629   ALKPHOS 74 10/23/2021 1629   BILITOT 0.2 (L) 10/23/2021 1629   BILITOT 0.2 03/27/2021 1527   GFRNONAA >60 06/19/2023 0420    No results found for: "CEA1", "CEA" / No results found for: "CEA1", "CEA" No results found for: "PSA1" No results found for: "WUJ811" No results found for: "CAN125"  No results found for: "TOTALPROTELP", "ALBUMINELP", "A1GS", "A2GS", "BETS", "BETA2SER", "GAMS", "MSPIKE", "SPEI" No results found for: "TIBC", "FERRITIN", "IRONPCTSAT" No results found for: "LDH"   STUDIES:   PERIPHERAL VASCULAR CATHETERIZATION Result Date: 06/21/2023 Images from the original result were not included.   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  Ultrasound-guided access right common femoral artery 2.  Aortogram with catheter selection of abdominal aorta 3.  Left lower extremity arteriogram from catheter in abdominal aorta 4.  21 minutes of monitored moderate conscious sedation time  Indications: Patient is a 68 year old male that has previously undergone right leg revascularization.  Now with evidence of left fifth toe wound ongoing for months.  He presents for lower extremity arteriogram mostly for evaluation of distal target given known left external iliac and common femoral artery occlusion on CT.  Risks benefits discussed.  Findings:  Ultrasound-guided access right common femoral artery.  Abdominal aortogram showed patent infrarenal aorta.  The right renal artery has a high-grade stenosis.  The left common iliac artery has about a 50% stenosis proximally from calcified plaque.  The hypogastric on the left is patent with an occluded external and common femoral.  The distal common femoral reconstitutes with a patent profunda and SFA.  The SFA is patent except for mid to distal SFA has a short 5 cm occlusion.  Distally there is a patent popliteal with three-vessel runoff.  AT/PT are dominant flow into the foot.             Procedure:  The patient was identified in the holding area and taken to room 8.  The patient was then placed supine on the table and prepped and draped in the usual sterile fashion.  A time out was called.  Patient received Versed and fentanyl for conscious moderate sedation.  Vital signs were monitored including heart rate, respiratory rate, oxygenation and blood pressure.  I was present for all of moderate sedation.  Ultrasound was used to evaluate the right common femoral artery.  It was patent .  A digital ultrasound image was acquired.  A micropuncture needle was used to access the right common femoral artery under ultrasound guidance.  An 018 wire was advanced without  resistance and a micropuncture sheath was placed.  The 018 wire was removed and a benson wire was placed.  The micropuncture sheath was exchanged for a 5 french sheath.  An omniflush catheter was advanced over the wire to the level of L-1.  An abdominal angiogram was obtained.  Given left iliac occlusion I left the catheter in the abdominal aorta.  We then got staged imaging of the left lower extremity for identification of distal targets.  Findings are noted above.  Wires and catheters were removed.  The 5 French sheath in the right groin was removed while holding pressure.  Plan: Patient will need left common femoral endarterectomy with retrograde iliac stenting and likely antegrade stenting as wel for SFA diseasel.  Will post for the OR with Dr. Randie Heinz in the near future.  Likely discharge in the interim.   Cephus Shelling, MD [facility] Office: [phone]   VAS [place] ABI WITH/WO TBI Result Date: 06/19/2023  LOWER EXTREMITY DOPPLER STUDY Patient Name:  Paul Madden  Date of Exam:   06/19/2023 Medical Rec #: [id number]    Accession #:    [id number] Date of Birth: November 04, 1955    Patient Gender: M Patient Age:   16 years Exam Location:  [facility] Procedure:      VAS [place] ABI WITH/WO TBI Referring Phys: Lemar Livings --------------------------------------------------------------------------------  Indications: Claudication, gangrene, and peripheral artery disease. 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 Performing Technologist: Marilynne Halsted RDMS, RVT  Examination Guidelines: A complete evaluation includes at minimum, Doppler waveform signals and systolic blood pressure reading at the level of bilateral brachial, anterior tibial, and posterior tibial arteries, when  vessel segments are accessible. Bilateral testing is considered an integral part of a complete examination. Photoelectric Plethysmograph (PPG) waveforms and toe systolic pressure readings are included as required and additional duplex testing as needed. Limited examinations for reoccurring indications may be performed as noted.  ABI Findings: +---------+------------------+-----+---------+---------+ Right    Rt Pressure (mmHg)IndexWaveform Comment   +---------+------------------+-----+---------+---------+ Brachial 118                    triphasic          +---------+------------------+-----+---------+---------+ PTA      125               1.02 triphasic          +---------+------------------+-----+---------+---------+ DP       125               1.02 triphasic          +---------+------------------+-----+---------+---------+ Great Toe58                0.47 Abnormal 2nd digit +---------+------------------+-----+---------+---------+ +---------+------------------+-----+----------+-------+ Left     Lt Pressure (mmHg)IndexWaveform  Comment +---------+------------------+-----+----------+-------+ Brachial 123                    triphasic         +---------+------------------+-----+----------+-------+ PTA      58                0.47 monophasic        +---------+------------------+-----+----------+-------+ DP       63                0.51 monophasic        +---------+------------------+-----+----------+-------+ Great Toe0                 0.00 Absent            +---------+------------------+-----+----------+-------+ +-------+-----------+-----------+------------+------------+ ABI/TBIToday's ABIToday's TBIPrevious ABIPrevious TBI +-------+-----------+-----------+------------+------------+ Right  1.02                  0                        +-------+-----------+-----------+------------+------------+ Left   0.51       absent     0.65        0.05          +-------+-----------+-----------+------------+------------+  Summary: Right: Resting right ankle-brachial index is within normal range. Left: Resting left ankle-brachial index indicates moderate left lower extremity arterial disease. *See table(s) above for measurements and observations.  Electronically signed by Coral Else MD on 06/19/2023 at 7:27:26 PM.    Final    VAS [place] LOWER EXTREMITY SAPHENOUS VEIN MAPPING Result Date: 06/19/2023 LOWER EXTREMITY VEIN MAPPING Patient Name:  Paul Madden  Date of Exam:   06/19/2023 Medical Rec #: [id number]  Accession #:    [id number] Date of Birth: 1955/10/08    Patient Gender: M Patient Age:   89 years Exam [facility]:  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 Other Risk Factors: Amutation right grerat toe 10/24/21.  Performing Technologist: Marilynne Halsted RDMS, RVT  Examination Guidelines: A complete evaluation includes B-mode imaging, spectral Doppler, color Doppler, and power Doppler as needed of all accessible portions of each vessel. Bilateral testing is considered an integral part of a complete examination. Limited examinations for reoccurring indications may be performed as noted. +---------------+-----------+----------------------+---------------+-----------+   RT Diameter  RT Findings         GSV            LT Diameter  LT Findings      (cm)                                            (cm)                  +---------------+-----------+----------------------+---------------+-----------+                               Saphenofemoral         0.39                                                   Junction                                   +---------------+-----------+----------------------+---------------+-----------+                               Proximal thigh         0.36                  +---------------+-----------+----------------------+---------------+-----------+                                 Mid thigh            0.36       branching  +---------------+-----------+----------------------+---------------+-----------+                                Distal thigh          0.35                  +---------------+-----------+----------------------+---------------+-----------+                                    Knee              0.27                  +---------------+-----------+----------------------+---------------+-----------+  Prox calf            0.20                  +---------------+-----------+----------------------+---------------+-----------+                                  Mid calf            0.22                  +---------------+-----------+----------------------+---------------+-----------+                                Distal calf           0.18                  +---------------+-----------+----------------------+---------------+-----------+                                   Ankle              0.20                  +---------------+-----------+----------------------+---------------+-----------+ Diagnosing physician: Coral Else MD Electronically signed by Coral Else MD on 06/19/2023 at 7:26:51 PM.    Final    CT Angio Aortobifemoral W and/or Wo Contrast Result Date: 06/18/2023 CLINICAL DATA:  Left fifth toe pain, loss of nail EXAM: CT ANGIOGRAPHY OF ABDOMINAL AORTA WITH ILIOFEMORAL RUNOFF TECHNIQUE: Multidetector CT imaging of the abdomen, pelvis and lower extremities was performed using the standard protocol during bolus administration of intravenous contrast. Multiplanar CT image reconstructions and MIPs were obtained to evaluate the vascular  anatomy. RADIATION DOSE REDUCTION: This exam was performed according to the departmental dose-optimization program which includes automated exposure control, adjustment of the mA and/or kV according to patient size and/or use of iterative reconstruction technique. CONTRAST:  OMNIPAQUE IOHEXOL 350 MG/ML SOLN COMPARISON:  10/20/2021 FINDINGS: VASCULAR Aorta: Normal caliber aorta without aneurysm, dissection, vasculitis or significant stenosis. Moderate atherosclerosis. Celiac: Stable mild stenosis at the origin of the celiac artery. No aneurysm, dissection, or vasculitis. SMA: Patent without evidence of aneurysm, dissection, vasculitis or significant stenosis. Mild atheromatous plaque at the origin of the SMA unchanged. Renals: There are 2 right-sided renal arteries, again with high-grade stenosis of the more inferior right renal artery. There is a left pelvic kidney, with 2 left renal arteries identified. The more superior artery arises from the ventral margin of the distal aorta and the more inferior artery arises from the right common iliac artery. Stable atherosclerosis without high-grade stenosis. No evidence of aneurysm, dissection, vasculitis, or fibromuscular dysplasia. IMA: Patent without evidence of aneurysm, dissection, vasculitis or significant stenosis. RIGHT Lower Extremity Inflow: Chronic atherosclerosis of the right common iliac and external iliac arteries without evidence of high-grade stenosis. 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 No evidence of aneurysm or dissection. Outflow: Interval right femoral-popliteal bypass graft. 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 The bypass graft appears patent, though the distal aspect is not well evaluated due to imaging out pacing the contrast bolus. There is complete occlusion of the native right superficial femoral artery. The native right profundus femoral  artery is patent. Runoff: Trifurcation vessels cannot be evaluated due  to timing of the contrast bolus, with the scanner out pacing the bolus beyond the level of the distal thigh. LEFT Lower Extremity Inflow: Chronic atherosclerosis of the left common iliac and internal iliac arteries without high-grade stenosis. 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 No evidence of aneurysm or dissection. Outflow: There is reconstitution of the left common femoral artery at the level of the bifurcation via collaterals from the left internal iliac. The left superficial femoral and profundus femoral arteries are mildly decreasing caliber, bladder widely patent within their proximal and mid extends. There is segmental occlusion of the left SFA as it exits Hunter's canal, new since prior study, extending approximately 5 cm in length reference image 89/8. There is reconstitution of the distal left SFA and popliteal arteries via profundus femoral collaterals. No evidence of aneurysm or dissection. Runoff: The left trifurcation vessels are diminutive but patent through the level of the ankle. Veins: No obvious venous abnormality within the limitations of this arterial phase study. Review of the MIP images confirms the above findings. NON-VASCULAR Lower chest: Areas of subpleural linear consolidation within the anterior left lower lobe and right middle lobe likely reflect subsegmental atelectasis. There is nodular subpleural consolidation within the medial left lower lobe measuring 8 mm reference image 16/4, indeterminate. Hepatobiliary: No focal liver abnormality is seen. No gallstones, gallbladder wall thickening, or biliary dilatation. Pancreas: Unremarkable. No pancreatic ductal dilatation or surrounding inflammatory changes. Spleen: Normal in size without focal abnormality. Adrenals/Urinary Tract: Left pelvic kidney. Mild bilateral renal cortical thinning. No urinary tract calculi or obstructive uropathy within  either kidney. Stable appearance of the adrenal glands. Mild bladder wall thickening is likely due to under distension. Stomach/Bowel: No bowel obstruction no bowel obstruction or ileus. Right inguinal hernia contains multiple loops of bowel without evidence of incarceration or obstruction. No bowel wall thickening or inflammatory change. Lymphatic: No pathologic adenopathy within the abdomen or pelvis. Reproductive: Stable appearance of the prostate. Other: No free fluid or free intraperitoneal gas. Right inguinal hernia containing multiple loops of small bowel, stable. No incarceration or obstruction. Musculoskeletal: There are no acute displaced fractures. Evidence of prior amputation of the first digit at the level of the metatarsophalangeal joint. No CT evidence of osteomyelitis within the area of concern at the left fifth digit. Reconstructed images demonstrate no additional findings. IMPRESSION: VASCULAR 1. 5 cm segmental occlusion of the distal left SFA, new since prior study. There is distal reconstitution of the left SFA and popliteal artery via collaterals from the profundus femoral system. The left popliteal artery and trifurcation vessels are diminutive, but patent through the level of the ankle. 2. 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 Imaging out paces the contrast bolus beyond the mid right thigh, limiting evaluation for patency of the distal aspect of the bypass graft and right lower extremity runoff vessels. 3. 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 4. 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 5.  Aortic Atherosclerosis (ICD10-I70.0). NON-VASCULAR 1. Areas of subpleural consolidation within the lung bases, with slightly nodular  appearance in the medial left lower lobe, favor subsegmental atelectasis. Follow-up may be warranted to exclude pulmonary nodule. 2. Stable right inguinal hernia containing multiple loops of distal small bowel. No incarceration or obstruction. 3. Left pelvic kidney. Electronically  Signed   By: Sharlet Salina M.D.   On: 06/18/2023 17:03

## 2023-06-24 ENCOUNTER — Inpatient Hospital Stay: Payer: Medicare HMO

## 2023-06-24 ENCOUNTER — Telehealth: Payer: Self-pay

## 2023-06-24 ENCOUNTER — Inpatient Hospital Stay: Payer: Medicare HMO | Attending: Hematology | Admitting: Hematology

## 2023-06-24 ENCOUNTER — Other Ambulatory Visit: Payer: Self-pay

## 2023-06-24 DIAGNOSIS — Z1211 Encounter for screening for malignant neoplasm of colon: Secondary | ICD-10-CM

## 2023-06-24 NOTE — Telephone Encounter (Signed)
Copied from CRM [id number]. Topic: General - Other >> Jun 21, 2023 11:04 AM Carlatta H wrote: Reason for CRM: Please call Ms Jennette Kettle [phone]//The patients colonoscopy kit was mailed to the wrong Address and needs to be resent

## 2023-06-24 NOTE — Progress Notes (Signed)
Transition Care Management Unsuccessful Follow-up Telephone Call  Date of discharge and from where:  [facility] 1/20  Attempts:  2nd Attempt  Reason for unsuccessful TCM follow-up call:  No answer/busy   Lenard Forth [facility] Guide, Phone: [phone] Fax: [phone] Website: [email or web address]

## 2023-06-24 NOTE — Telephone Encounter (Signed)
Cologuard has been resent, spoke to pt this morning regarding this. Pt made aware , confirmed address in chart.

## 2023-06-27 ENCOUNTER — Telehealth: Payer: Self-pay | Admitting: Internal Medicine

## 2023-06-27 NOTE — Telephone Encounter (Signed)
Reserved slot for pt HFU 2/10 at 9:00 am

## 2023-06-27 NOTE — Telephone Encounter (Unsigned)
Copied from CRM [id number]. Topic: General - Other >> Jun 27, 2023  2:07 PM Eunice Blase wrote: Reason for CRM: Pt called needs to be seen for hospital follow up before surgery. Please call pt to schedule. [phone]

## 2023-06-29 DIAGNOSIS — F1910 Other psychoactive substance abuse, uncomplicated: Secondary | ICD-10-CM | POA: Diagnosis not present

## 2023-06-29 DIAGNOSIS — M869 Osteomyelitis, unspecified: Secondary | ICD-10-CM | POA: Diagnosis not present

## 2023-06-29 DIAGNOSIS — I70223 Atherosclerosis of native arteries of extremities with rest pain, bilateral legs: Secondary | ICD-10-CM | POA: Diagnosis not present

## 2023-07-03 ENCOUNTER — Inpatient Hospital Stay: Payer: Medicare HMO | Attending: Hematology

## 2023-07-03 ENCOUNTER — Encounter (INDEPENDENT_AMBULATORY_CARE_PROVIDER_SITE_OTHER): Payer: Self-pay | Admitting: *Deleted

## 2023-07-03 ENCOUNTER — Inpatient Hospital Stay (HOSPITAL_BASED_OUTPATIENT_CLINIC_OR_DEPARTMENT_OTHER): Payer: Medicare HMO | Admitting: Oncology

## 2023-07-03 ENCOUNTER — Encounter: Payer: Self-pay | Admitting: Oncology

## 2023-07-03 VITALS — BP 131/74 | HR 78 | Temp 97.8°F | Resp 20 | Ht 70.08 in | Wt 124.0 lb

## 2023-07-03 DIAGNOSIS — D703 Neutropenia due to infection: Secondary | ICD-10-CM

## 2023-07-03 DIAGNOSIS — F1090 Alcohol use, unspecified, uncomplicated: Secondary | ICD-10-CM | POA: Insufficient documentation

## 2023-07-03 DIAGNOSIS — K279 Peptic ulcer, site unspecified, unspecified as acute or chronic, without hemorrhage or perforation: Secondary | ICD-10-CM | POA: Diagnosis not present

## 2023-07-03 DIAGNOSIS — F17210 Nicotine dependence, cigarettes, uncomplicated: Secondary | ICD-10-CM | POA: Diagnosis not present

## 2023-07-03 DIAGNOSIS — I96 Gangrene, not elsewhere classified: Secondary | ICD-10-CM | POA: Diagnosis not present

## 2023-07-03 DIAGNOSIS — F1910 Other psychoactive substance abuse, uncomplicated: Secondary | ICD-10-CM | POA: Diagnosis not present

## 2023-07-03 DIAGNOSIS — D72819 Decreased white blood cell count, unspecified: Secondary | ICD-10-CM | POA: Diagnosis not present

## 2023-07-03 DIAGNOSIS — D509 Iron deficiency anemia, unspecified: Secondary | ICD-10-CM

## 2023-07-03 DIAGNOSIS — D649 Anemia, unspecified: Secondary | ICD-10-CM | POA: Insufficient documentation

## 2023-07-03 DIAGNOSIS — Z79899 Other long term (current) drug therapy: Secondary | ICD-10-CM | POA: Insufficient documentation

## 2023-07-03 LAB — CBC WITH DIFFERENTIAL/PLATELET
Abs Immature Granulocytes: 0.02 10*3/uL (ref 0.00–0.07)
Basophils Absolute: 0 10*3/uL (ref 0.0–0.1)
Basophils Relative: 1 %
Eosinophils Absolute: 0.1 10*3/uL (ref 0.0–0.5)
Eosinophils Relative: 5 %
HCT: 33 % — ABNORMAL LOW (ref 39.0–52.0)
Hemoglobin: 10 g/dL — ABNORMAL LOW (ref 13.0–17.0)
Immature Granulocytes: 1 %
Lymphocytes Relative: 43 %
Lymphs Abs: 0.8 10*3/uL (ref 0.7–4.0)
MCH: 27 pg (ref 26.0–34.0)
MCHC: 30.3 g/dL (ref 30.0–36.0)
MCV: 88.9 fL (ref 80.0–100.0)
Monocytes Absolute: 0.1 10*3/uL (ref 0.1–1.0)
Monocytes Relative: 3 %
Neutro Abs: 0.9 10*3/uL — ABNORMAL LOW (ref 1.7–7.7)
Neutrophils Relative %: 47 %
Platelets: 460 10*3/uL — ABNORMAL HIGH (ref 150–400)
RBC: 3.71 MIL/uL — ABNORMAL LOW (ref 4.22–5.81)
RDW: 14.6 % (ref 11.5–15.5)
WBC: 1.9 10*3/uL — ABNORMAL LOW (ref 4.0–10.5)
nRBC: 0 % (ref 0.0–0.2)

## 2023-07-03 LAB — LACTATE DEHYDROGENASE: LDH: 219 U/L — ABNORMAL HIGH (ref 98–192)

## 2023-07-03 LAB — FOLATE: Folate: 10.5 ng/mL (ref >5.9)

## 2023-07-03 LAB — COMPREHENSIVE METABOLIC PANEL
ALT: 12 U/L (ref 0–44)
AST: 18 U/L (ref 15–41)
Albumin: 3.5 g/dL (ref 3.5–5.0)
Alkaline Phosphatase: 66 U/L (ref 38–126)
Anion gap: 10 (ref 5–15)
BUN: 15 mg/dL (ref 8–23)
CO2: 25 mmol/L (ref 22–32)
Calcium: 9.3 mg/dL (ref 8.9–10.3)
Chloride: 107 mmol/L (ref 98–111)
Creatinine, Ser: 1.01 mg/dL (ref 0.61–1.24)
GFR, Estimated: >60 mL/min (ref >60)
Glucose, Bld: 91 mg/dL (ref 70–99)
Potassium: 3.9 mmol/L (ref 3.5–5.1)
Sodium: 142 mmol/L (ref 135–145)
Total Bilirubin: 0.5 mg/dL (ref 0.0–1.2)
Total Protein: 7.9 g/dL (ref 6.5–8.1)

## 2023-07-03 LAB — HIV ANTIBODY (ROUTINE TESTING W REFLEX): HIV Screen 4th Generation wRfx: NONREACTIVE

## 2023-07-03 LAB — IRON AND TIBC
Iron: 37 ug/dL — ABNORMAL LOW (ref 45–182)
Saturation Ratios: 13 % — ABNORMAL LOW (ref 17.9–39.5)
TIBC: 285 ug/dL (ref 250–450)
UIBC: 248 ug/dL

## 2023-07-03 LAB — HEPATITIS PANEL, ACUTE
HCV Ab: NONREACTIVE
Hep A IgM: NONREACTIVE
Hep B C IgM: NONREACTIVE
Hepatitis B Surface Ag: NONREACTIVE

## 2023-07-03 LAB — FERRITIN: Ferritin: 211 ng/mL (ref 24–336)

## 2023-07-03 LAB — VITAMIN B12: Vitamin B-12: 191 pg/mL (ref 180–914)

## 2023-07-03 MED ORDER — PANTOPRAZOLE SODIUM 40 MG PO TBEC: 40.0000 mg | DELAYED_RELEASE_TABLET | Freq: Every day | ORAL | 2 refills | Status: AC

## 2023-07-03 NOTE — Progress Notes (Signed)
 [facility] at [facility] HEMATOLOGY NEW VISIT  Tobie Suzzane POUR, MD  REASON FOR REFERRAL: Leukopenia   HISTORY OF PRESENT ILLNESS: Paul Madden 68 y.o. male referred for leukopenia.  Has a past medical history of polysubstance abuse,Hypertension, peripheral artery disease was recently in the ER for left fifth toe gangrene and was discharged on antibiotics.  At this time he was found to have a leukopenia with neutropenia.  Patient reported that he did not eat anything this morning and has heartburn and has not taken anything for it.  He stated that it started 3 days ago after eating spaghetti.  Patient does have a history of anemia and also reports intermittent blood in his stools.  He never got this evaluated.  He does not remember if ever there was colonoscopy or endoscopy was done.  He reports that his left fifth toe still hurts, he completed the antibiotics as prescribed.  He has no other complaints today.  He denies fever, chills, weight loss, night sweats.  He also denies fatigue and dyspnea on exertion.  Patient is a current smoker consuming about 1 pack a day, admits to alcohol use, 3 beers a day.  He also admits to occasional cocaine use.  He lives in [place] with his landlord.  I have reviewed the past medical history, past surgical history, social history and family history with the patient   ALLERGIES:  has no known allergies.  MEDICATIONS:  Current Outpatient Medications  Medication Sig Dispense Refill   amLODipine  (NORVASC ) 10 MG tablet Take 1 tablet (10 mg total) by mouth daily. 60 tablet 0   aspirin  EC 81 MG tablet Take 1 tablet (81 mg total) by mouth daily. Swallow whole. 60 tablet 0   atorvastatin  (LIPITOR ) 40 MG tablet Take 1 tablet (40 mg total) by mouth at bedtime. 60 tablet 0   doxycycline  (VIBRA -TABS) 100 MG tablet Take 1 tablet (100 mg total) by mouth 2 (two) times daily. 14 tablet 0   Ferrous Sulfate  (IRON ) 325 (65 Fe) MG TABS Take  1 tablet (325 mg total) by mouth daily. 60 tablet 0   ibuprofen  (ADVIL ) 600 MG tablet Take 1 tablet (600 mg total) by mouth every 6 (six) hours as needed for moderate pain (pain score 4-6). 30 tablet 0   nicotine  (NICODERM CQ  - DOSED IN MG/24 HOURS) 21 mg/24hr patch Place 1 patch (21 mg total) onto the skin daily. 28 patch 0   pantoprazole  (PROTONIX ) 40 MG tablet Take 1 tablet (40 mg total) by mouth daily. 30 tablet 2   polyethylene glycol powder (GLYCOLAX /MIRALAX ) 17 GM/SCOOP powder Take 17 g by mouth daily. 238 g 0   No current facility-administered medications for this visit.     REVIEW OF SYSTEMS:   Constitutional: Denies fevers, chills or night sweats Eyes: Denies blurriness of vision Ears, nose, mouth, throat, and face: Denies mucositis or sore throat Respiratory: Denies cough, dyspnea or wheezes Cardiovascular: Denies palpitation, chest discomfort or lower extremity swelling Gastrointestinal:  Denies nausea, heartburn or change in bowel habits Skin: Denies abnormal skin rashes Lymphatics: Denies new lymphadenopathy or easy bruising Neurological:Denies numbness, tingling or new weaknesses Behavioral/Psych: Mood is stable, no new changes  All other systems were reviewed with the patient and are negative.  PHYSICAL EXAMINATION:   Vitals:   07/03/23 0958 07/03/23 1003  BP: (!) 148/85 131/74  Pulse: 78   Resp: 20   Temp: 97.8 F (36.6 C)   SpO2: 100%  GENERAL:alert, no distress and comfortable LYMPH:  no palpable lymphadenopathy in the cervical, axillary or inguinal LUNGS: clear to auscultation and percussion with normal breathing effort HEART: regular rate & rhythm and no murmurs and no lower extremity edema ABDOMEN:abdomen soft, non-tender and normal bowel sounds Musculoskeletal:Left fifth toe gangrene of distal phalange, black, no discharge, nontender.  No signs of active infection. NEURO: alert & oriented x 3 with fluent speech  LABORATORY DATA:  I have reviewed  the data as listed  Lab Results  Component Value Date   WBC 1.5 (L) 06/21/2023   NEUTROABS 0.5 (L) 06/21/2023   HGB 9.3 (L) 06/21/2023   HCT 28.7 (L) 06/21/2023   MCV 86.2 06/21/2023   PLT 429 (H) 06/21/2023       Chemistry      Component Value Date/Time   NA 138 06/19/2023 0420   NA 145 (H) 11/01/2021 1542   K 4.6 06/19/2023 0420   CL 106 06/19/2023 0420   CO2 29 06/19/2023 0420   BUN 15 06/19/2023 0420   BUN 19 11/01/2021 1542   CREATININE 1.11 06/19/2023 0420      Component Value Date/Time   CALCIUM  8.5 (L) 06/19/2023 0420   ALKPHOS 74 10/23/2021 1629   AST 28 10/23/2021 1629   ALT 17 10/23/2021 1629   BILITOT 0.2 (L) 10/23/2021 1629   BILITOT 0.2 03/27/2021 1527      ASSESSMENT & PLAN:  Patient is a 68 year old male with polysubstance abuse referred for leukopenia   Leukopenia Leukopenia with neutropenia with no blasts on peripheral smear.  Likely secondary to nutritional deficiencies, infection at the time of blood draw.No signs of fever, chills, recent weight loss, or lymphadenopathy.  Patient also has a history of polysubstance abuse that might be contributing to this as well. -Repeat CBC along with nutritional studies today -Will replenish as needed and repeat labs in a few weeks to reassess.  Anemia Patient has microcytic anemia likely secondary to nutritional deficiencies or chronic blood loss.  Patient does not remember if he ever had a colonoscopy endoscopy done.  He reports heartburn frequently. -Will workup for anemia including nutritional studies and replenish as needed -Refer to GI for endoscopy and colonoscopy  Return to clinic in 6 weeks with labs for reassessment  Peptic ulcer disease Patient reports frequent heartburn.  He drinks alcohol on a daily basis. -Start pantoprazole  daily -Follow-up with GI  Polysubstance abuse (HCC) Reports of daily tobacco use, occasional cocaine use, and daily alcohol consumption. -Advised patient to quit  smoking, stop cocaine use, and reduce alcohol consumption.   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  Pleated a course of 7 days of doxycycline .  He is supposed to follow-up with vascular surgery soon for stent placement -No signs of infection at this time   Orders Placed This Encounter  Procedures   Ferritin    Standing Status:   Future    Number of Occurrences:   1    Expected Date:   07/03/2023    Expiration Date:   07/02/2024   Folate    Standing Status:   Future    Number of Occurrences:   1    Expected Date:   07/03/2023    Expiration Date:   07/02/2024   Vitamin B12    Standing Status:   Future    Number of Occurrences:   1    Expected Date:   07/03/2023  Expiration Date:   07/02/2024   CBC with Differential/Platelet    Standing Status:   Future    Number of Occurrences:   1    Expected Date:   07/03/2023    Expiration Date:   07/02/2024   Comprehensive metabolic panel    Standing Status:   Future    Number of Occurrences:   1    Expected Date:   07/03/2023    Expiration Date:   07/02/2024   Haptoglobin    Standing Status:   Future    Number of Occurrences:   1    Expected Date:   07/03/2023    Expiration Date:   07/02/2024   Iron  and TIBC    Standing Status:   Future    Number of Occurrences:   1    Expected Date:   07/03/2023    Expiration Date:   07/02/2024    Release to patient:   Immediate [1]   HIV Antibody (routine testing w rflx)    Standing Status:   Future    Number of Occurrences:   1    Expected Date:   07/03/2023    Expiration Date:   07/02/2024    Release to patient:   Immediate [1]   Hepatitis panel, acute    Standing Status:   Future    Number of Occurrences:   1    Expected Date:   07/03/2023    Expiration Date:   07/02/2024    Release to patient:   Immediate [1]   Copper , serum    Standing Status:   Future    Number of Occurrences:   1    Expected Date:   07/03/2023    Expiration Date:   07/02/2024    Lactate dehydrogenase    Standing Status:   Future    Number of Occurrences:   1    Expected Date:   07/03/2023    Expiration Date:   07/02/2024    The total time spent in the appointment was 60 minutes encounter with patients including review of chart and various tests results, discussions about plan of care and coordination of care plan   All questions were answered. The patient knows to call the clinic with any problems, questions or concerns. No barriers to learning was detected.   Mickiel Dry, MD 2/5/202510:53 AM

## 2023-07-03 NOTE — Assessment & Plan Note (Signed)
 Patient reports frequent heartburn.  He drinks alcohol on a daily basis. -Start pantoprazole  daily -Follow-up with GI

## 2023-07-03 NOTE — Assessment & Plan Note (Signed)
 Reports of daily tobacco use, occasional cocaine use, and daily alcohol consumption. -Advised patient to quit smoking, stop cocaine use, and reduce alcohol consumption.

## 2023-07-03 NOTE — Assessment & Plan Note (Signed)
 Patient has microcytic anemia likely secondary to nutritional deficiencies or chronic blood loss.  Patient does not remember if he ever had a colonoscopy endoscopy done.  He reports heartburn frequently. -Will workup for anemia including nutritional studies and replenish as needed -Refer to GI for endoscopy and colonoscopy  Return to clinic in 6 weeks with labs for reassessment

## 2023-07-03 NOTE — Patient Instructions (Signed)
 VISIT SUMMARY:  During today's visit, we discussed your recent heartburn, intermittent blood in your stools, history of anemia, low white blood cell count, and ongoing foot infection. We also talked about your smoking, occasional cocaine use, and daily alcohol consumption.  YOUR PLAN:  -LEUKOPENIA: Leukopenia means you have a low white blood cell count, which can be due to nutritional deficiencies or an active infection. We will order blood tests to check for deficiencies in iron  and vitamin B12. We will reevaluate your condition in six weeks, and if your white blood cell count is still low, we may need to do further testing to rule out blood cancer.  -ANEMIA: Anemia is a condition where you don't have enough healthy red blood cells to carry adequate oxygen to your body's tissues. This could be due to nutritional deficiencies or gastrointestinal bleeding. We will order blood tests to check for iron  and vitamin B12 deficiencies and refer you to a gastrointestinal specialist for further evaluation and a possible colonoscopy.  -GASTROESOPHAGEAL REFLUX DISEASE (GERD): GERD is a condition where stomach acid frequently flows back into the tube connecting your mouth and stomach, causing heartburn. We will prescribe pantoprazole  to help manage your symptoms. Additionally, reducing your alcohol and tobacco use can help alleviate GERD symptoms.  -FOOT INFECTION: You have an ongoing foot infection that has not improved with antibiotics. Followup with vascular surgeon for evaluation and possible stent placement to improve blood circulation in your foot.  -SUBSTANCE USE: Your daily tobacco use, occasional cocaine use, and daily alcohol consumption can negatively impact your health. We strongly advise you to quit smoking, stop using cocaine, and reduce your alcohol intake.  -GENERAL HEALTH MAINTENANCE: We will order an HIV panel due to your low white blood cell count and reported substance use. Routine blood tests  will also be conducted to check for any deficiencies. We will reevaluate your condition in six weeks.  INSTRUCTIONS:  Please follow up in six weeks for reevaluation. Make sure to complete the full course of antibiotics for your foot infection. We will also schedule a referral to a gastrointestinal specialist for further evaluation of your anemia and possible colonoscopy. Additionally, you will be referred to a vascular surgeon for your foot infection.

## 2023-07-03 NOTE — Assessment & Plan Note (Signed)
 Leukopenia with neutropenia with no blasts on peripheral smear.  Likely secondary to nutritional deficiencies, infection at the time of blood draw.No signs of fever, chills, recent weight loss, or lymphadenopathy.  Patient also has a history of polysubstance abuse that might be contributing to this as well. -Repeat CBC along with nutritional studies today -Will replenish as needed and repeat labs in a few weeks to reassess.

## 2023-07-03 NOTE — Assessment & Plan Note (Signed)
 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  Pleated a course of 7 days of doxycycline .  He is supposed to follow-up with vascular surgery soon for stent placement -No signs of infection at this time

## 2023-07-04 ENCOUNTER — Other Ambulatory Visit: Payer: Self-pay | Admitting: Oncology

## 2023-07-04 DIAGNOSIS — D500 Iron deficiency anemia secondary to blood loss (chronic): Secondary | ICD-10-CM

## 2023-07-04 DIAGNOSIS — D509 Iron deficiency anemia, unspecified: Secondary | ICD-10-CM | POA: Insufficient documentation

## 2023-07-04 DIAGNOSIS — D649 Anemia, unspecified: Secondary | ICD-10-CM

## 2023-07-04 LAB — HAPTOGLOBIN: Haptoglobin: 397 mg/dL — ABNORMAL HIGH (ref 32–363)

## 2023-07-04 MED ORDER — VITAMIN B-12 1000 MCG PO TABS: 1000.0000 ug | ORAL_TABLET | Freq: Every day | ORAL | 0 refills | Status: DC

## 2023-07-04 MED ORDER — IRON 325 (65 FE) MG PO TABS: 1.0000 | ORAL_TABLET | ORAL | 0 refills | Status: DC

## 2023-07-04 NOTE — Progress Notes (Signed)
 Appointments made and patient is aware.  Will need to reschedule appointments, per wife as they have other appointments scheduled for 2/11.

## 2023-07-05 LAB — COPPER, SERUM: Copper: 180 ug/dL — ABNORMAL HIGH (ref 69–132)

## 2023-07-08 ENCOUNTER — Ambulatory Visit: Payer: Medicare HMO | Admitting: Internal Medicine

## 2023-07-08 ENCOUNTER — Encounter: Payer: Self-pay | Admitting: Internal Medicine

## 2023-07-08 VITALS — BP 137/69 | HR 79 | Ht 70.0 in | Wt 123.2 lb

## 2023-07-08 DIAGNOSIS — F1910 Other psychoactive substance abuse, uncomplicated: Secondary | ICD-10-CM

## 2023-07-08 DIAGNOSIS — I96 Gangrene, not elsewhere classified: Secondary | ICD-10-CM

## 2023-07-08 DIAGNOSIS — D703 Neutropenia due to infection: Secondary | ICD-10-CM | POA: Diagnosis not present

## 2023-07-08 DIAGNOSIS — Z720 Tobacco use: Secondary | ICD-10-CM | POA: Diagnosis not present

## 2023-07-08 DIAGNOSIS — D500 Iron deficiency anemia secondary to blood loss (chronic): Secondary | ICD-10-CM

## 2023-07-08 DIAGNOSIS — I10 Essential (primary) hypertension: Secondary | ICD-10-CM | POA: Diagnosis not present

## 2023-07-08 DIAGNOSIS — Z09 Encounter for follow-up examination after completed treatment for conditions other than malignant neoplasm: Secondary | ICD-10-CM | POA: Diagnosis not present

## 2023-07-08 DIAGNOSIS — K279 Peptic ulcer, site unspecified, unspecified as acute or chronic, without hemorrhage or perforation: Secondary | ICD-10-CM | POA: Diagnosis not present

## 2023-07-08 DIAGNOSIS — I739 Peripheral vascular disease, unspecified: Secondary | ICD-10-CM

## 2023-07-08 MED ORDER — ASPIRIN 81 MG PO TBEC: 81.0000 mg | DELAYED_RELEASE_TABLET | Freq: Every day | ORAL | 0 refills | Status: AC

## 2023-07-08 MED ORDER — AMLODIPINE BESYLATE 10 MG PO TABS: 10.0000 mg | ORAL_TABLET | Freq: Every day | ORAL | 1 refills | Status: AC

## 2023-07-08 MED ORDER — IRON 325 (65 FE) MG PO TABS: 1.0000 | ORAL_TABLET | ORAL | 1 refills | Status: AC

## 2023-07-08 MED ORDER — IBUPROFEN 600 MG PO TABS: 600.0000 mg | ORAL_TABLET | Freq: Three times a day (TID) | ORAL | 0 refills | Status: AC | PRN

## 2023-07-08 MED ORDER — ATORVASTATIN CALCIUM 40 MG PO TABS: 40.0000 mg | ORAL_TABLET | Freq: Every day | ORAL | 3 refills | Status: AC

## 2023-07-08 NOTE — Assessment & Plan Note (Signed)
Hospital chart reviewed, including discharge summary ?Medications reconciled and reviewed with the patient ?

## 2023-07-08 NOTE — Assessment & Plan Note (Signed)
 Likely secondary to nutritional deficiencies and/or recent infection Followed by hematology

## 2023-07-08 NOTE — Assessment & Plan Note (Signed)
 Has used cocaine a day before admission Advised to avoid using any illicit drug Needs to quit smoking and avoid alcohol use as well

## 2023-07-08 NOTE — Assessment & Plan Note (Signed)
 Asked about quitting: confirms that he/she currently smokes cigarettes Advise to quit smoking: Educated about QUITTING to reduce the risk of cancer, cardio and cerebrovascular disease. Assess willingness: Unwilling to quit at this time, but is working on cutting back. Assist with counseling and pharmacotherapy: Counseled for 5 minutes. Arrange for follow up: follow up in 3 months and continue to offer help.

## 2023-07-08 NOTE — Assessment & Plan Note (Addendum)
 Needs to continue aspirin  and statin, sent refills Followed by vascular surgery, had referred to ER for possible gangrene of left foot in the last visit CT angiogram showed 5 cm segmental occlusion of the distal left SFA S/p diagnostic angiogram in 01/25

## 2023-07-08 NOTE — Assessment & Plan Note (Addendum)
 Likely due to nutritional deficiencies Continue iron  supplement Needs GI evaluation, will plan after vascular surgery

## 2023-07-08 NOTE — Assessment & Plan Note (Signed)
 BP: 137/69   Well-controlled witht Amlodipine  10 mg QD Counseled for compliance with the medications Advised DASH diet and moderate exercise/walking as tolerated

## 2023-07-08 NOTE — Progress Notes (Signed)
 Established Patient Office Visit  Subjective:  Patient ID: Paul Madden, male    DOB: 1955-11-26  Age: 68 y.o. MRN: [id number]  CC:  Chief Complaint  Patient presents with   Follow-up    Hospital f/u, reports pain on his left foot , pinky toe.     HPI Paul Madden is a 68 y.o. male with past medical history of HTN, chronic low back pain, tobacco abuse and alcohol use who presents for f/u of his chronic medical conditions and recent hospitalization from 06/18/23-06/21/23.  He was sent to ER for left fifth toe discoloration, intermittent pain.  CT angiogram showed 5 cm segmental occlusion of the distal left SFA.  Case was discussed with vascular surgery.  Patient transferred here for vascular intervention. No sign of infection, antibiotics on hold. A1c 5.2, LDL of 65.  Home medications show Lipitor , not sure he was taking those. Status post diagnostic angiogram.  Vascular surgery recommending left common femoral artery endarterectomy with retrograde iliac stenting and antegrade SFA stenting as an outpatient next week with Dr. Vikki Mulford. He has history of right great toe amputation due to critical limb ischemia/PAD.  He has started taking aspirin  and statin now. He had lost follow-up with podiatry and vascular surgery in the last visit.  His BP was wnl today, has started taking amlodipine .  Denies any headache, dizziness, chest pain, dyspnea or palpitations.   Past Medical History:  Diagnosis Date   Chronic low back pain    Hypertension     Past Surgical History:  Procedure Laterality Date   AMPUTATION TOE Right 10/24/2021   Procedure: AMPUTATION OF THE RIGHT GREAT TOE;  Surgeon: Paul Hoof, MD;  Location: [facility] OR;  Service: Vascular;  Laterality: Right;   FEMORAL-POPLITEAL BYPASS GRAFT Right 10/24/2021   Procedure: RIGHT BYPASS GRAFT COMMON FEMORAL-POPLITEAL ARTERY;  Surgeon: Paul Hoof, MD;  Location: [facility] OR;  Service: Vascular;  Laterality:  Right;   LOWER EXTREMITY ANGIOGRAPHY N/A 06/20/2023   Procedure: Lower Extremity Angiography;  Surgeon: Paul Hensen, MD;  Location: [facility] INVASIVE CV LAB;  Service: Cardiovascular;  Laterality: N/A;    History reviewed. No pertinent family history.  Social History   Socioeconomic History   Marital status: Single    Spouse name: Not on file   Number of children: Not on file   Years of education: Not on file   Highest education level: Not on file  Occupational History   Not on file  Tobacco Use   Smoking status: Every Day   Smokeless tobacco: Never  Substance and Sexual Activity   Alcohol use: Yes    Comment: occ   Drug use: No   Sexual activity: Not on file  Other Topics Concern   Not on file  Social History Narrative   Not on file   Social Drivers of Health   Financial Resource Strain: Not on file  Food Insecurity: No Food Insecurity (06/18/2023)   Hunger Vital Sign    Worried About Running Out of Food in the Last Year: Never true    Ran Out of Food in the Last Year: Never true  Transportation Needs: No Transportation Needs (06/18/2023)   PRAPARE - Administrator, Civil Service (Medical): No    Lack of Transportation (Non-Medical): No  Physical Activity: Not on file  Stress: Not on file  Social Connections: Socially Isolated (06/20/2023)   Social Connection and Isolation Panel [NHANES]    Frequency of Communication with Friends and  Family: Twice a week    Frequency of Social Gatherings with Friends and Family: Twice a week    Attends Religious Services: Never    Database administrator or Organizations: No    Attends Banker Meetings: Never    Marital Status: Never married  Intimate Partner Violence: Not At Risk (06/18/2023)   Humiliation, Afraid, Rape, and Kick questionnaire    Fear of Current or Ex-Partner: No    Emotionally Abused: No    Physically Abused: No    Sexually Abused: No    Outpatient Medications Prior to Visit   Medication Sig Dispense Refill   cyanocobalamin  (VITAMIN B12) 1000 MCG tablet Take 1 tablet (1,000 mcg total) by mouth daily. 90 tablet 0   nicotine  (NICODERM CQ  - DOSED IN MG/24 HOURS) 21 mg/24hr patch Place 1 patch (21 mg total) onto the skin daily. 28 patch 0   pantoprazole  (PROTONIX ) 40 MG tablet Take 1 tablet (40 mg total) by mouth daily. 30 tablet 2   polyethylene glycol powder (GLYCOLAX /MIRALAX ) 17 GM/SCOOP powder Take 17 g by mouth daily. (Patient taking differently: Take 17 g by mouth daily as needed for moderate constipation.) 238 g 0   amLODipine  (NORVASC ) 10 MG tablet Take 1 tablet (10 mg total) by mouth daily. 60 tablet 0   aspirin  EC 81 MG tablet Take 1 tablet (81 mg total) by mouth daily. Swallow whole. 60 tablet 0   atorvastatin  (LIPITOR ) 40 MG tablet Take 1 tablet (40 mg total) by mouth at bedtime. 60 tablet 0   doxycycline  (VIBRA -TABS) 100 MG tablet Take 1 tablet (100 mg total) by mouth 2 (two) times daily. 14 tablet 0   Ferrous Sulfate  (IRON ) 325 (65 Fe) MG TABS Take 1 tablet (325 mg total) by mouth every other day. 90 tablet 0   ibuprofen  (ADVIL ) 600 MG tablet Take 1 tablet (600 mg total) by mouth every 6 (six) hours as needed for moderate pain (pain score 4-6). 30 tablet 0   No facility-administered medications prior to visit.    No Known Allergies  ROS Review of Systems  Constitutional:  Negative for chills and fever.  HENT:  Negative for congestion and sore throat.   Eyes:  Negative for pain and discharge.  Respiratory:  Negative for cough and shortness of breath.   Cardiovascular:  Negative for chest pain and palpitations.  Gastrointestinal:  Negative for constipation, diarrhea, nausea and vomiting.  Endocrine: Negative for polydipsia and polyuria.  Genitourinary:  Negative for dysuria and hematuria.  Musculoskeletal:  Negative for neck pain and neck stiffness.       Lett foot pain  Skin:  Positive for wound.  Neurological:  Negative for dizziness, weakness,  numbness and headaches.  Psychiatric/Behavioral:  Negative for agitation and behavioral problems.       Objective:    Physical Exam Vitals reviewed.  Constitutional:      General: He is not in acute distress.    Appearance: He is not diaphoretic.  HENT:     Head: Normocephalic and atraumatic.     Nose: Nose normal.     Mouth/Throat:     Mouth: Mucous membranes are moist.  Eyes:     General: No scleral icterus.    Extraocular Movements: Extraocular movements intact.  Cardiovascular:     Rate and Rhythm: Normal rate and regular rhythm.     Heart sounds: Normal heart sounds. No murmur heard.    Comments: DPA pulse diminished on b/l LE Pulmonary:  Breath sounds: Normal breath sounds. No wheezing or rales.  Abdominal:     Palpations: Abdomen is soft.     Tenderness: There is no abdominal tenderness.  Musculoskeletal:     Cervical back: Neck supple. No tenderness.     Right lower leg: No edema.     Left lower leg: No edema.     Comments:    Skin:    General: Skin is warm.     Findings: No rash.     Comments: Left 5th toe blackish discoloration  Neurological:     General: No focal deficit present.     Mental Status: He is alert and oriented to person, place, and time.     Sensory: Sensory deficit (B/l hands) present.     Motor: Weakness (B/l LE - 4/5) present.  Psychiatric:        Mood and Affect: Mood normal.        Behavior: Behavior normal.      BP 137/69   Pulse 79   Ht 5\' 10"  (1.778 m)   Wt 123 lb 3.2 oz (55.9 kg)   SpO2 100%   BMI 17.68 kg/m  Wt Readings from Last 3 Encounters:  07/08/23 123 lb 3.2 oz (55.9 kg)  07/03/23 124 lb (56.2 kg)  06/18/23 119 lb 0.8 oz (54 kg)    No results found for: "TSH" Lab Results  Component Value Date   WBC 1.9 (L) 07/03/2023   HGB 10.0 (L) 07/03/2023   HCT 33.0 (L) 07/03/2023   MCV 88.9 07/03/2023   PLT 460 (H) 07/03/2023   Lab Results  Component Value Date   NA 142 07/03/2023   K 3.9 07/03/2023   CO2 25  07/03/2023   GLUCOSE 91 07/03/2023   BUN 15 07/03/2023   CREATININE 1.01 07/03/2023   BILITOT 0.5 07/03/2023   ALKPHOS 66 07/03/2023   AST 18 07/03/2023   ALT 12 07/03/2023   PROT 7.9 07/03/2023   ALBUMIN  3.5 07/03/2023   CALCIUM  9.3 07/03/2023   ANIONGAP 10 07/03/2023   EGFR 87 11/01/2021   Lab Results  Component Value Date   CHOL 101 06/21/2023   Lab Results  Component Value Date   HDL 19 (L) 06/21/2023   Lab Results  Component Value Date   LDLCALC 65 06/21/2023   Lab Results  Component Value Date   TRIG 86 06/21/2023   Lab Results  Component Value Date   CHOLHDL 5.3 06/21/2023   Lab Results  Component Value Date   HGBA1C 5.2 06/19/2023      Assessment & Plan:   Problem List Items Addressed This Visit       Cardiovascular and Mediastinum   Essential hypertension   BP: 137/69   Well-controlled witht Amlodipine  10 mg QD Counseled for compliance with the medications Advised DASH diet and moderate exercise/walking as tolerated      Relevant Medications   amLODipine  (NORVASC ) 10 MG tablet   aspirin  EC 81 MG tablet   atorvastatin  (LIPITOR ) 40 MG tablet   PAD (peripheral artery disease) (HCC)   Needs to continue aspirin  and statin, sent refills Followed by vascular surgery, had referred to ER for possible gangrene of left foot in the last visit CT angiogram showed 5 cm segmental occlusion of the distal left SFA S/p diagnostic angiogram in 01/25      Relevant Medications   amLODipine  (NORVASC ) 10 MG tablet   aspirin  EC 81 MG tablet   atorvastatin  (LIPITOR ) 40 MG tablet  Digestive   Peptic ulcer disease   Reports frequent heartburn Has history of alcohol abuse and cocaine abuse Recently started pantoprazole , needs to start taking it Will plan for GI evaluation after vascular surgery        Other   Tobacco abuse   Asked about quitting: confirms that he/she currently smokes cigarettes Advise to quit smoking: Educated about QUITTING to reduce  the risk of cancer, cardio and cerebrovascular disease. Assess willingness: Unwilling to quit at this time, but is working on cutting back. Assist with counseling and pharmacotherapy: Counseled for 5 minutes. Arrange for follow up: follow up in 3 months and continue to offer help.      Polysubstance abuse (HCC)   Has used cocaine a day before admission Advised to avoid using any illicit drug Needs to quit smoking and avoid alcohol use as well      Hospital discharge follow-up   Hospital chart reviewed, including discharge summary Medications reconciled and reviewed with the patient      Leukopenia   Likely secondary to nutritional deficiencies and/or recent infection Followed by hematology      Gangrene of toe of left foot (HCC) - Primary   Recent hospitalization Has been evaluated by vascular surgery - planned to get left common femoral artery endarterectomy with retrograde iliac stenting and antegrade SFA stenting Continue aspirin  and statin Ibuprofen  as needed for pain, advised to limit its use up to q8h PRN -unable to prescribe controlled substances due to polysubstance abuse      Relevant Medications   ibuprofen  (ADVIL ) 600 MG tablet   IDA (iron  deficiency anemia)   Likely due to nutritional deficiencies Continue iron  supplement Needs GI evaluation, will plan after vascular surgery      Relevant Medications   Ferrous Sulfate  (IRON ) 325 (65 Fe) MG TABS     Meds ordered this encounter  Medications   Ferrous Sulfate  (IRON ) 325 (65 Fe) MG TABS    Sig: Take 1 tablet (325 mg total) by mouth every other day.    Dispense:  90 tablet    Refill:  1   amLODipine  (NORVASC ) 10 MG tablet    Sig: Take 1 tablet (10 mg total) by mouth daily.    Dispense:  90 tablet    Refill:  1   aspirin  EC 81 MG tablet    Sig: Take 1 tablet (81 mg total) by mouth daily. Swallow whole.    Dispense:  60 tablet    Refill:  0   atorvastatin  (LIPITOR ) 40 MG tablet    Sig: Take 1 tablet (40  mg total) by mouth at bedtime.    Dispense:  90 tablet    Refill:  3   ibuprofen  (ADVIL ) 600 MG tablet    Sig: Take 1 tablet (600 mg total) by mouth every 8 (eight) hours as needed for moderate pain (pain score 4-6).    Dispense:  30 tablet    Refill:  0    Follow-up: Return in about 3 months (around 10/05/2023).    Meldon Sport, MD

## 2023-07-08 NOTE — Patient Instructions (Signed)
 Please continue to take medications as prescribed.  Please continue to follow low carb diet and ambulate as tolerated.  Please avoid smoking or any other illicit substances.

## 2023-07-08 NOTE — Assessment & Plan Note (Signed)
 Reports frequent heartburn Has history of alcohol abuse and cocaine abuse Recently started pantoprazole , needs to start taking it Will plan for GI evaluation after vascular surgery

## 2023-07-08 NOTE — Assessment & Plan Note (Signed)
 Recent hospitalization Has been evaluated by vascular surgery - planned to get left common femoral artery endarterectomy with retrograde iliac stenting and antegrade SFA stenting Continue aspirin  and statin Ibuprofen  as needed for pain, advised to limit its use up to q8h PRN -unable to prescribe controlled substances due to polysubstance abuse

## 2023-07-09 ENCOUNTER — Ambulatory Visit: Payer: Self-pay | Admitting: Family Medicine

## 2023-07-09 ENCOUNTER — Ambulatory Visit: Payer: Medicare HMO

## 2023-07-09 NOTE — Progress Notes (Signed)
Consent updated.

## 2023-07-09 NOTE — Progress Notes (Signed)
Surgical Instructions   Your procedure is scheduled on Tuesday, February 18th, 2025. Report to [facility] Main Entrance "A" at 5:30 A.M., then check in with the Admitting office. Any questions or running late day of surgery: call [phone]  Questions prior to your surgery date: call [phone], Monday-Friday, 8am-4pm. If you experience any cold or flu symptoms such as cough, fever, chills, shortness of breath, etc. between now and your scheduled surgery, please notify us at the above number.     Remember:  Do not eat or drink after midnight the night before your surgery    Take these medicines the morning of surgery with A SIP OF WATER: Amlodipine (Norvasc) Aspirin  Pantoprazole (Protonix)   May take these medicines IF NEEDED: None.    One week prior to surgery, STOP taking any Aspirin (unless otherwise instructed by your surgeon) Aleve, Naproxen, Ibuprofen, Motrin, Advil, Goody's, BC's, all herbal medications, fish oil, and non-prescription vitamins.                     Do NOT Smoke (Tobacco/Vaping) for 24 hours prior to your procedure.  If you use a CPAP at night, you may bring your mask/headgear for your overnight stay.   You will be asked to remove any contacts, glasses, piercing's, hearing aid's, dentures/partials prior to surgery. Please bring cases for these items if needed.    Patients discharged the day of surgery will not be allowed to drive home, and someone needs to stay with them for 24 hours.  SURGICAL WAITING ROOM VISITATION Patients may have no more than 2 support people in the waiting area - these visitors may rotate.   Pre-op nurse will coordinate an appropriate time for 1 ADULT support person, who may not rotate, to accompany patient in pre-op.  Children under the age of 56 must have an adult with them who is not the patient and must remain in the main waiting area with an adult.  If the patient needs to stay at the hospital during part of their  recovery, the visitor guidelines for inpatient rooms apply.  Please refer to the [facility] website for the visitor guidelines for any additional information.   If you received a COVID test during your pre-op visit  it is requested that you wear a mask when out in public, stay away from anyone that may not be feeling well and notify your surgeon if you develop symptoms. If you have been in contact with anyone that has tested positive in the last 10 days please notify you surgeon.      Pre-operative CHG Bathing Instructions   You can play a key role in reducing the risk of infection after surgery. Your skin needs to be as free of germs as possible. You can reduce the number of germs on your skin by washing with CHG (chlorhexidine gluconate) soap before surgery. CHG is an antiseptic soap that kills germs and continues to kill germs even after washing.   DO NOT use if you have an allergy to chlorhexidine/CHG or antibacterial soaps. If your skin becomes reddened or irritated, stop using the CHG and notify one of our RNs at [phone].              TAKE A SHOWER THE NIGHT BEFORE SURGERY AND THE DAY OF SURGERY    Please keep in mind the following:  DO NOT shave, including legs and underarms, 48 hours prior to surgery.   You may shave your face before/day  of surgery.  Place clean sheets on your bed the night before surgery Use a clean washcloth (not used since being washed) for each shower. DO NOT sleep with pet's night before surgery.  CHG Shower Instructions:  Wash your face and private area with normal soap. If you choose to wash your hair, wash first with your normal shampoo.  After you use shampoo/soap, rinse your hair and body thoroughly to remove shampoo/soap residue.  Turn the water OFF and apply half the bottle of CHG soap to a CLEAN washcloth.  Apply CHG soap ONLY FROM YOUR NECK DOWN TO YOUR TOES (washing for 3-5 minutes)  DO NOT use CHG soap on face, private areas, open wounds,  or sores.  Pay special attention to the area where your surgery is being performed.  If you are having back surgery, having someone wash your back for you may be helpful. Wait 2 minutes after CHG soap is applied, then you may rinse off the CHG soap.  Pat dry with a clean towel  Put on clean pajamas    Additional instructions for the day of surgery: DO NOT APPLY any lotions, deodorants, cologne, or perfumes.   Do not wear jewelry or makeup Do not wear nail polish, gel polish, artificial nails, or any other type of covering on natural nails (fingers and toes) Do not bring valuables to the hospital. [facility] is not responsible for valuables/personal belongings. Put on clean/comfortable clothes.  Please brush your teeth.  Ask your nurse before applying any prescription medications to the skin.

## 2023-07-09 NOTE — Addendum Note (Signed)
Addended by: Cherene Julian B on: 07/09/2023 09:53 AM   Modules accepted: Orders

## 2023-07-10 ENCOUNTER — Encounter (HOSPITAL_COMMUNITY)
Admission: RE | Admit: 2023-07-10 | Discharge: 2023-07-10 | Disposition: A | Discharge status: Home / Self Care | Payer: Medicare HMO | Source: Ambulatory Visit | Attending: Vascular Surgery | Admitting: Vascular Surgery

## 2023-07-10 ENCOUNTER — Encounter (HOSPITAL_COMMUNITY): Payer: Self-pay

## 2023-07-10 ENCOUNTER — Other Ambulatory Visit: Payer: Self-pay

## 2023-07-10 VITALS — BP 128/74 | HR 77 | Temp 98.3°F | Resp 18 | Ht 69.0 in | Wt 123.4 lb

## 2023-07-10 DIAGNOSIS — Z01818 Encounter for other preprocedural examination: Secondary | ICD-10-CM | POA: Diagnosis not present

## 2023-07-10 DIAGNOSIS — I70223 Atherosclerosis of native arteries of extremities with rest pain, bilateral legs: Secondary | ICD-10-CM | POA: Insufficient documentation

## 2023-07-10 HISTORY — DX: Gastro-esophageal reflux disease without esophagitis: K21.9

## 2023-07-10 LAB — CBC
HCT: 32.3 % — ABNORMAL LOW (ref 39.0–52.0)
Hemoglobin: 10 g/dL — ABNORMAL LOW (ref 13.0–17.0)
MCH: 27.5 pg (ref 26.0–34.0)
MCHC: 31 g/dL (ref 30.0–36.0)
MCV: 88.7 fL (ref 80.0–100.0)
Platelets: 489 10*3/uL — ABNORMAL HIGH (ref 150–400)
RBC: 3.64 MIL/uL — ABNORMAL LOW (ref 4.22–5.81)
RDW: 14.4 % (ref 11.5–15.5)
WBC: 2.6 10*3/uL — ABNORMAL LOW (ref 4.0–10.5)
nRBC: 0 % (ref 0.0–0.2)

## 2023-07-10 LAB — COMPREHENSIVE METABOLIC PANEL
ALT: 12 U/L (ref 0–44)
AST: 15 U/L (ref 15–41)
Albumin: 3 g/dL — ABNORMAL LOW (ref 3.5–5.0)
Alkaline Phosphatase: 62 U/L (ref 38–126)
Anion gap: 12 (ref 5–15)
BUN: 10 mg/dL (ref 8–23)
CO2: 29 mmol/L (ref 22–32)
Calcium: 9.2 mg/dL (ref 8.9–10.3)
Chloride: 102 mmol/L (ref 98–111)
Creatinine, Ser: 0.95 mg/dL (ref 0.61–1.24)
GFR, Estimated: >60 mL/min (ref >60)
Glucose, Bld: 106 mg/dL — ABNORMAL HIGH (ref 70–99)
Potassium: 4.3 mmol/L (ref 3.5–5.1)
Sodium: 143 mmol/L (ref 135–145)
Total Bilirubin: 0.5 mg/dL (ref 0.0–1.2)
Total Protein: 7.2 g/dL (ref 6.5–8.1)

## 2023-07-10 LAB — SURGICAL PCR SCREEN
MRSA, PCR: NEGATIVE
Staphylococcus aureus: NEGATIVE

## 2023-07-10 LAB — URINALYSIS, ROUTINE W REFLEX MICROSCOPIC
Bilirubin Urine: NEGATIVE
Glucose, UA: NEGATIVE mg/dL
Hgb urine dipstick: NEGATIVE
Ketones, ur: NEGATIVE mg/dL
Leukocytes,Ua: NEGATIVE
Nitrite: NEGATIVE
Protein, ur: NEGATIVE mg/dL
Specific Gravity, Urine: 1.019 (ref 1.005–1.030)
pH: 6 (ref 5.0–8.0)

## 2023-07-10 LAB — TYPE AND SCREEN
ABO/RH(D): A POS
Antibody Screen: NEGATIVE

## 2023-07-10 LAB — APTT: aPTT: 33 s (ref 24–36)

## 2023-07-10 NOTE — Progress Notes (Signed)
PCP - Rutwik Patel,MD Cardiologist - denies Hematologist - Hyndavi Kandala,MD  PPM/ICD - denies Device Orders -  Rep Notified -   Chest x-ray -  EKG - 07/10/23 Stress Test - denies ECHO - denies Cardiac Cath - denies  Sleep Study - denies CPAP -   Fasting Blood Sugar - na Checks Blood Sugar _____ times a day  Last dose of GLP1 agonist-  na GLP1 instructions:   Blood Thinner Instructions:no Aspirin Instructions:continue  ERAS Protcol -no PRE-SURGERY Ensure or G2-   COVID TEST- na   Anesthesia review: no  Patient denies shortness of breath, fever, cough and chest pain at PAT appointment   All instructions explained to the patient, with a verbal understanding of the material. Patient agrees to go over the instructions while at home for a better understanding. Patient also instructed to wear a mask when out in public prior to surgery. The opportunity to ask questions was provided.

## 2023-07-10 NOTE — Progress Notes (Signed)
PCP - Trena Platt ,MD Cardiologist - denies  PPM/ICD - denies Device Orders -  Rep Notified -   Chest x-ray - na EKG - 07/10/23 Stress Test - denies ECHO - denies Cardiac Cath - denies  Sleep Study - denies CPAP -   Fasting Blood Sugar - na Checks Blood Sugar _____ times a day  Last dose of GLP1 agonist-  na GLP1 instructions:   Blood Thinner Instructions:na Aspirin Instructions:continue  ERAS Protcol -no PRE-SURGERY Ensure or G2-   COVID TEST- na   Anesthesia review: no  Patient denies shortness of breath, fever, cough and chest pain at PAT appointment   All instructions explained to the patient, with a verbal understanding of the material. Patient agrees to go over the instructions while at home for a better understanding. Patient also instructed to self quarantine after being tested for COVID-19. The opportunity to ask questions was provided.

## 2023-07-16 ENCOUNTER — Inpatient Hospital Stay (HOSPITAL_COMMUNITY): Payer: Self-pay | Admitting: Certified Registered Nurse Anesthetist

## 2023-07-16 ENCOUNTER — Inpatient Hospital Stay (HOSPITAL_COMMUNITY): Payer: Medicare HMO

## 2023-07-16 ENCOUNTER — Inpatient Hospital Stay (HOSPITAL_COMMUNITY)
Admission: RE | Admit: 2023-07-16 | Discharge: 2023-07-19 | DRG: 271 | Disposition: A | Discharge status: Home / Self Care | Payer: Medicare HMO | Attending: Vascular Surgery | Admitting: Vascular Surgery

## 2023-07-16 ENCOUNTER — Encounter (HOSPITAL_COMMUNITY)
Admission: RE | Disposition: A | Discharge status: Home / Self Care | Payer: Self-pay | Source: Home / Self Care | Attending: Vascular Surgery

## 2023-07-16 ENCOUNTER — Other Ambulatory Visit: Payer: Self-pay

## 2023-07-16 DIAGNOSIS — M1990 Unspecified osteoarthritis, unspecified site: Secondary | ICD-10-CM | POA: Diagnosis not present

## 2023-07-16 DIAGNOSIS — Z7902 Long term (current) use of antithrombotics/antiplatelets: Secondary | ICD-10-CM | POA: Diagnosis not present

## 2023-07-16 DIAGNOSIS — M545 Low back pain, unspecified: Secondary | ICD-10-CM | POA: Diagnosis present

## 2023-07-16 DIAGNOSIS — D509 Iron deficiency anemia, unspecified: Secondary | ICD-10-CM | POA: Diagnosis not present

## 2023-07-16 DIAGNOSIS — I743 Embolism and thrombosis of arteries of the lower extremities: Secondary | ICD-10-CM | POA: Diagnosis present

## 2023-07-16 DIAGNOSIS — I10 Essential (primary) hypertension: Secondary | ICD-10-CM

## 2023-07-16 DIAGNOSIS — Z89421 Acquired absence of other right toe(s): Secondary | ICD-10-CM

## 2023-07-16 DIAGNOSIS — L97529 Non-pressure chronic ulcer of other part of left foot with unspecified severity: Secondary | ICD-10-CM | POA: Diagnosis present

## 2023-07-16 DIAGNOSIS — I70262 Atherosclerosis of native arteries of extremities with gangrene, left leg: Principal | ICD-10-CM | POA: Diagnosis present

## 2023-07-16 DIAGNOSIS — Z604 Social exclusion and rejection: Secondary | ICD-10-CM | POA: Diagnosis present

## 2023-07-16 DIAGNOSIS — M869 Osteomyelitis, unspecified: Secondary | ICD-10-CM

## 2023-07-16 DIAGNOSIS — I70222 Atherosclerosis of native arteries of extremities with rest pain, left leg: Secondary | ICD-10-CM | POA: Diagnosis not present

## 2023-07-16 DIAGNOSIS — I70229 Atherosclerosis of native arteries of extremities with rest pain, unspecified extremity: Principal | ICD-10-CM | POA: Diagnosis present

## 2023-07-16 DIAGNOSIS — I82522 Chronic embolism and thrombosis of left iliac vein: Secondary | ICD-10-CM | POA: Diagnosis not present

## 2023-07-16 DIAGNOSIS — Z79899 Other long term (current) drug therapy: Secondary | ICD-10-CM | POA: Diagnosis not present

## 2023-07-16 DIAGNOSIS — Z7982 Long term (current) use of aspirin: Secondary | ICD-10-CM

## 2023-07-16 DIAGNOSIS — G8929 Other chronic pain: Secondary | ICD-10-CM | POA: Diagnosis present

## 2023-07-16 DIAGNOSIS — F1410 Cocaine abuse, uncomplicated: Secondary | ICD-10-CM | POA: Diagnosis present

## 2023-07-16 DIAGNOSIS — F17210 Nicotine dependence, cigarettes, uncomplicated: Secondary | ICD-10-CM

## 2023-07-16 DIAGNOSIS — I745 Embolism and thrombosis of iliac artery: Secondary | ICD-10-CM | POA: Diagnosis present

## 2023-07-16 DIAGNOSIS — F1110 Opioid abuse, uncomplicated: Secondary | ICD-10-CM | POA: Diagnosis not present

## 2023-07-16 DIAGNOSIS — D62 Acute posthemorrhagic anemia: Secondary | ICD-10-CM | POA: Diagnosis not present

## 2023-07-16 DIAGNOSIS — K219 Gastro-esophageal reflux disease without esophagitis: Secondary | ICD-10-CM | POA: Diagnosis present

## 2023-07-16 DIAGNOSIS — I96 Gangrene, not elsewhere classified: Secondary | ICD-10-CM

## 2023-07-16 DIAGNOSIS — Z89411 Acquired absence of right great toe: Secondary | ICD-10-CM

## 2023-07-16 DIAGNOSIS — Z95828 Presence of other vascular implants and grafts: Secondary | ICD-10-CM

## 2023-07-16 DIAGNOSIS — D72819 Decreased white blood cell count, unspecified: Secondary | ICD-10-CM | POA: Diagnosis not present

## 2023-07-16 HISTORY — PX: ENDARTERECTOMY FEMORAL: SHX5804

## 2023-07-16 LAB — POCT I-STAT 7, (LYTES, BLD GAS, ICA,H+H)
Acid-Base Excess: 3 mmol/L — ABNORMAL HIGH (ref 0.0–2.0)
Bicarbonate: 28.1 mmol/L — ABNORMAL HIGH (ref 20.0–28.0)
Calcium, Ion: 1.16 mmol/L (ref 1.15–1.40)
HCT: 27 % — ABNORMAL LOW (ref 39.0–52.0)
Hemoglobin: 9.2 g/dL — ABNORMAL LOW (ref 13.0–17.0)
O2 Saturation: 100 %
Potassium: 4.6 mmol/L (ref 3.5–5.1)
Sodium: 138 mmol/L (ref 135–145)
TCO2: 29 mmol/L (ref 22–32)
pCO2 arterial: 46.3 mm[Hg] (ref 32–48)
pH, Arterial: 7.391 (ref 7.35–7.45)
pO2, Arterial: 189 mm[Hg] — ABNORMAL HIGH (ref 83–108)

## 2023-07-16 LAB — CREATININE, SERUM
Creatinine, Ser: 1.42 mg/dL — ABNORMAL HIGH (ref 0.61–1.24)
GFR, Estimated: 54 mL/min — ABNORMAL LOW (ref >60)

## 2023-07-16 LAB — CBC
HCT: 27 % — ABNORMAL LOW (ref 39.0–52.0)
Hemoglobin: 8.6 g/dL — ABNORMAL LOW (ref 13.0–17.0)
MCH: 27.9 pg (ref 26.0–34.0)
MCHC: 31.9 g/dL (ref 30.0–36.0)
MCV: 87.7 fL (ref 80.0–100.0)
Platelets: 469 10*3/uL — ABNORMAL HIGH (ref 150–400)
RBC: 3.08 MIL/uL — ABNORMAL LOW (ref 4.22–5.81)
RDW: 14.8 % (ref 11.5–15.5)
WBC: 3 10*3/uL — ABNORMAL LOW (ref 4.0–10.5)
nRBC: 0 % (ref 0.0–0.2)

## 2023-07-16 LAB — POCT ACTIVATED CLOTTING TIME
Activated Clotting Time: 222 s
Activated Clotting Time: 228 s

## 2023-07-16 SURGERY — ENDARTERECTOMY, FEMORAL
Anesthesia: General | Site: Groin | Laterality: Left

## 2023-07-16 MED ORDER — EPHEDRINE 5 MG/ML INJ
INTRAVENOUS | Status: AC
  Filled 2023-07-16: qty 5

## 2023-07-16 MED ORDER — 0.9 % SODIUM CHLORIDE (POUR BTL) OPTIME
TOPICAL | Status: DC | PRN
  Administered 2023-07-16: 2000 mL

## 2023-07-16 MED ORDER — PHENOL 1.4 % MT LIQD: 1.0000 | OROMUCOSAL | Status: DC | PRN

## 2023-07-16 MED ORDER — METOPROLOL TARTRATE 5 MG/5ML IV SOLN
2.0000 mg | INTRAVENOUS | Status: DC | PRN
Start: 2023-07-16 — End: 2023-07-19

## 2023-07-16 MED ORDER — FENTANYL CITRATE (PF) 100 MCG/2ML IJ SOLN: 25.0000 ug | INTRAMUSCULAR | Status: DC | PRN

## 2023-07-16 MED ORDER — HYDRALAZINE HCL 20 MG/ML IJ SOLN
5.0000 mg | INTRAMUSCULAR | Status: DC | PRN
  Filled 2023-07-16: qty 1

## 2023-07-16 MED ORDER — SODIUM CHLORIDE 0.9 % IV SOLN: 500.0000 mL | Freq: Once | INTRAVENOUS | Status: DC | PRN

## 2023-07-16 MED ORDER — CHLORHEXIDINE GLUCONATE CLOTH 2 % EX PADS
6.0000 | MEDICATED_PAD | Freq: Once | CUTANEOUS | Status: DC
Start: 2023-07-16 — End: 2023-07-16

## 2023-07-16 MED ORDER — ORAL CARE MOUTH RINSE: 15.0000 mL | OROMUCOSAL | Status: DC | PRN

## 2023-07-16 MED ORDER — FENTANYL CITRATE (PF) 250 MCG/5ML IJ SOLN
INTRAMUSCULAR | Status: DC | PRN
Start: 2023-07-16 — End: 2023-07-16
  Administered 2023-07-16: 100 ug via INTRAVENOUS
  Administered 2023-07-16: 50 ug via INTRAVENOUS

## 2023-07-16 MED ORDER — PHENYLEPHRINE 80 MCG/ML (10ML) SYRINGE FOR IV PUSH (FOR BLOOD PRESSURE SUPPORT)
PREFILLED_SYRINGE | INTRAVENOUS | Status: AC
  Filled 2023-07-16: qty 10

## 2023-07-16 MED ORDER — ONDANSETRON HCL 4 MG/2ML IJ SOLN
INTRAMUSCULAR | Status: DC | PRN
Start: 2023-07-16 — End: 2023-07-16
  Administered 2023-07-16: 4 mg via INTRAVENOUS

## 2023-07-16 MED ORDER — HEPARIN 6000 UNIT IRRIGATION SOLUTION
Status: AC
  Filled 2023-07-16: qty 500

## 2023-07-16 MED ORDER — ACETAMINOPHEN 325 MG PO TABS: 325.0000 mg | ORAL_TABLET | ORAL | Status: DC | PRN

## 2023-07-16 MED ORDER — SODIUM CHLORIDE 0.9 % IV SOLN: INTRAVENOUS | Status: AC

## 2023-07-16 MED ORDER — AMLODIPINE BESYLATE 10 MG PO TABS
10.0000 mg | ORAL_TABLET | Freq: Every day | ORAL | Status: DC
Start: 2023-07-17 — End: 2023-07-19
  Administered 2023-07-17 – 2023-07-19 (×3): 10 mg via ORAL
  Filled 2023-07-16 (×3): qty 1

## 2023-07-16 MED ORDER — EPHEDRINE SULFATE-NACL 50-0.9 MG/10ML-% IV SOSY
PREFILLED_SYRINGE | INTRAVENOUS | Status: DC | PRN
  Administered 2023-07-16: 5 mg via INTRAVENOUS

## 2023-07-16 MED ORDER — DOCUSATE SODIUM 100 MG PO CAPS
100.0000 mg | ORAL_CAPSULE | Freq: Every day | ORAL | Status: DC
  Administered 2023-07-17 – 2023-07-19 (×3): 100 mg via ORAL
  Filled 2023-07-16 (×3): qty 1

## 2023-07-16 MED ORDER — ATORVASTATIN CALCIUM 40 MG PO TABS
40.0000 mg | ORAL_TABLET | Freq: Every day | ORAL | Status: DC
Start: 2023-07-16 — End: 2023-07-19
  Administered 2023-07-16 – 2023-07-18 (×3): 40 mg via ORAL
  Filled 2023-07-16 (×3): qty 1

## 2023-07-16 MED ORDER — POTASSIUM CHLORIDE CRYS ER 20 MEQ PO TBCR
20.0000 meq | EXTENDED_RELEASE_TABLET | Freq: Every day | ORAL | Status: DC | PRN
Start: 2023-07-16 — End: 2023-07-19

## 2023-07-16 MED ORDER — DEXAMETHASONE SODIUM PHOSPHATE 10 MG/ML IJ SOLN
INTRAMUSCULAR | Status: AC
  Filled 2023-07-16: qty 1

## 2023-07-16 MED ORDER — PROPOFOL 10 MG/ML IV BOLUS
INTRAVENOUS | Status: AC
  Filled 2023-07-16: qty 20

## 2023-07-16 MED ORDER — SODIUM CHLORIDE 0.9 % IV SOLN
INTRAVENOUS | Status: DC
Start: 2023-07-16 — End: 2023-07-16

## 2023-07-16 MED ORDER — HEPARIN SODIUM (PORCINE) 1000 UNIT/ML IJ SOLN
INTRAMUSCULAR | Status: DC | PRN
  Administered 2023-07-16: 3000 [IU] via INTRAVENOUS
  Administered 2023-07-16: 7000 [IU] via INTRAVENOUS
  Administered 2023-07-16: 3000 [IU] via INTRAVENOUS

## 2023-07-16 MED ORDER — ACETAMINOPHEN 10 MG/ML IV SOLN: 1000.0000 mg | Freq: Once | INTRAVENOUS | Status: DC | PRN

## 2023-07-16 MED ORDER — LABETALOL HCL 5 MG/ML IV SOLN
10.0000 mg | INTRAVENOUS | Status: DC | PRN
  Administered 2023-07-16: 10 mg via INTRAVENOUS
  Filled 2023-07-16: qty 4

## 2023-07-16 MED ORDER — HEPARIN SODIUM (PORCINE) 5000 UNIT/ML IJ SOLN
5000.0000 [IU] | Freq: Three times a day (TID) | INTRAMUSCULAR | Status: DC
  Administered 2023-07-17 – 2023-07-19 (×7): 5000 [IU] via SUBCUTANEOUS
  Filled 2023-07-16 (×7): qty 1

## 2023-07-16 MED ORDER — VASOPRESSIN 20 UNIT/ML IV SOLN
INTRAVENOUS | Status: DC | PRN
  Administered 2023-07-16: 1 [IU] via INTRAVENOUS
  Administered 2023-07-16: .5 [IU] via INTRAVENOUS

## 2023-07-16 MED ORDER — LACTATED RINGERS IV SOLN
INTRAVENOUS | Status: DC | PRN
Start: 2023-07-16 — End: 2023-07-16

## 2023-07-16 MED ORDER — PANTOPRAZOLE SODIUM 40 MG PO TBEC
40.0000 mg | DELAYED_RELEASE_TABLET | Freq: Every day | ORAL | Status: DC
  Administered 2023-07-17 – 2023-07-19 (×3): 40 mg via ORAL
  Filled 2023-07-16 (×3): qty 1

## 2023-07-16 MED ORDER — ACETAMINOPHEN 325 MG RE SUPP: 325.0000 mg | RECTAL | Status: DC | PRN

## 2023-07-16 MED ORDER — CLOPIDOGREL BISULFATE 75 MG PO TABS
75.0000 mg | ORAL_TABLET | Freq: Every day | ORAL | Status: DC
  Administered 2023-07-16 – 2023-07-19 (×4): 75 mg via ORAL
  Filled 2023-07-16 (×4): qty 1

## 2023-07-16 MED ORDER — PHENYLEPHRINE HCL-NACL 20-0.9 MG/250ML-% IV SOLN
INTRAVENOUS | Status: DC | PRN
  Administered 2023-07-16: 30 ug/min via INTRAVENOUS

## 2023-07-16 MED ORDER — PHENYLEPHRINE 80 MCG/ML (10ML) SYRINGE FOR IV PUSH (FOR BLOOD PRESSURE SUPPORT)
PREFILLED_SYRINGE | INTRAVENOUS | Status: DC | PRN
  Administered 2023-07-16: 80 ug via INTRAVENOUS
  Administered 2023-07-16: 160 ug via INTRAVENOUS

## 2023-07-16 MED ORDER — FENTANYL CITRATE (PF) 250 MCG/5ML IJ SOLN
INTRAMUSCULAR | Status: AC
  Filled 2023-07-16: qty 5

## 2023-07-16 MED ORDER — PROPOFOL 10 MG/ML IV BOLUS
INTRAVENOUS | Status: DC | PRN
  Administered 2023-07-16: 150 mg via INTRAVENOUS

## 2023-07-16 MED ORDER — GLYCOPYRROLATE PF 0.2 MG/ML IJ SOSY
PREFILLED_SYRINGE | INTRAMUSCULAR | Status: DC | PRN
  Administered 2023-07-16: .1 mg via INTRAVENOUS

## 2023-07-16 MED ORDER — ONDANSETRON HCL 4 MG/2ML IJ SOLN
INTRAMUSCULAR | Status: AC
  Filled 2023-07-16: qty 2

## 2023-07-16 MED ORDER — OXYCODONE-ACETAMINOPHEN 5-325 MG PO TABS
1.0000 | ORAL_TABLET | ORAL | Status: DC | PRN
  Administered 2023-07-16 – 2023-07-19 (×9): 2 via ORAL
  Filled 2023-07-16 (×9): qty 2

## 2023-07-16 MED ORDER — CHLORHEXIDINE GLUCONATE CLOTH 2 % EX PADS: 6.0000 | MEDICATED_PAD | Freq: Once | CUTANEOUS | Status: DC

## 2023-07-16 MED ORDER — ALUM & MAG HYDROXIDE-SIMETH 200-200-20 MG/5ML PO SUSP
15.0000 mL | ORAL | Status: DC | PRN
  Administered 2023-07-17: 30 mL via ORAL
  Filled 2023-07-16: qty 30

## 2023-07-16 MED ORDER — LIDOCAINE 2% (20 MG/ML) 5 ML SYRINGE
INTRAMUSCULAR | Status: AC
  Filled 2023-07-16: qty 5

## 2023-07-16 MED ORDER — ROCURONIUM BROMIDE 10 MG/ML (PF) SYRINGE
PREFILLED_SYRINGE | INTRAVENOUS | Status: DC | PRN
  Administered 2023-07-16: 50 mg via INTRAVENOUS
  Administered 2023-07-16: 30 mg via INTRAVENOUS
  Administered 2023-07-16: 20 mg via INTRAVENOUS

## 2023-07-16 MED ORDER — PANTOPRAZOLE SODIUM 40 MG PO TBEC: 40.0000 mg | DELAYED_RELEASE_TABLET | Freq: Every day | ORAL | Status: DC

## 2023-07-16 MED ORDER — IODIXANOL 320 MG/ML IV SOLN
INTRAVENOUS | Status: DC | PRN
  Administered 2023-07-16: 25 mL via INTRA_ARTERIAL

## 2023-07-16 MED ORDER — CEFAZOLIN SODIUM-DEXTROSE 2-4 GM/100ML-% IV SOLN
2.0000 g | Freq: Three times a day (TID) | INTRAVENOUS | Status: AC
  Administered 2023-07-16 (×2): 2 g via INTRAVENOUS
  Filled 2023-07-16 (×2): qty 100

## 2023-07-16 MED ORDER — ONDANSETRON HCL 4 MG/2ML IJ SOLN: 4.0000 mg | Freq: Four times a day (QID) | INTRAMUSCULAR | Status: DC | PRN

## 2023-07-16 MED ORDER — HEPARIN 6000 UNIT IRRIGATION SOLUTION
Status: DC | PRN
  Administered 2023-07-16 (×2): 1

## 2023-07-16 MED ORDER — SUGAMMADEX SODIUM 200 MG/2ML IV SOLN
INTRAVENOUS | Status: DC | PRN
  Administered 2023-07-16: 200 mg via INTRAVENOUS

## 2023-07-16 MED ORDER — GUAIFENESIN-DM 100-10 MG/5ML PO SYRP: 15.0000 mL | ORAL_SOLUTION | ORAL | Status: DC | PRN

## 2023-07-16 MED ORDER — CHLORHEXIDINE GLUCONATE 0.12 % MT SOLN
15.0000 mL | Freq: Once | OROMUCOSAL | Status: AC
  Administered 2023-07-16: 15 mL via OROMUCOSAL
  Filled 2023-07-16: qty 15

## 2023-07-16 MED ORDER — ALBUMIN HUMAN 5 % IV SOLN
INTRAVENOUS | Status: DC | PRN
Start: 2023-07-16 — End: 2023-07-16

## 2023-07-16 MED ORDER — LIDOCAINE 2% (20 MG/ML) 5 ML SYRINGE
INTRAMUSCULAR | Status: DC | PRN
  Administered 2023-07-16 (×2): 60 mg via INTRAVENOUS

## 2023-07-16 MED ORDER — DEXAMETHASONE SODIUM PHOSPHATE 10 MG/ML IJ SOLN
INTRAMUSCULAR | Status: DC | PRN
  Administered 2023-07-16: 10 mg via INTRAVENOUS

## 2023-07-16 MED ORDER — ROCURONIUM BROMIDE 10 MG/ML (PF) SYRINGE
PREFILLED_SYRINGE | INTRAVENOUS | Status: AC
  Filled 2023-07-16: qty 10

## 2023-07-16 MED ORDER — VASOPRESSIN 20 UNIT/ML IV SOLN
INTRAVENOUS | Status: AC
  Filled 2023-07-16: qty 1

## 2023-07-16 MED ORDER — MORPHINE SULFATE (PF) 2 MG/ML IV SOLN
2.0000 mg | INTRAVENOUS | Status: DC | PRN
  Administered 2023-07-16 – 2023-07-18 (×3): 2 mg via INTRAVENOUS
  Filled 2023-07-16 (×3): qty 1

## 2023-07-16 MED ORDER — ORAL CARE MOUTH RINSE: 15.0000 mL | Freq: Once | OROMUCOSAL | Status: AC

## 2023-07-16 MED ORDER — MAGNESIUM SULFATE 2 GM/50ML IV SOLN: 2.0000 g | Freq: Every day | INTRAVENOUS | Status: DC | PRN

## 2023-07-16 MED ORDER — PROTAMINE SULFATE 10 MG/ML IV SOLN
INTRAVENOUS | Status: DC | PRN
  Administered 2023-07-16 (×2): 25 mg via INTRAVENOUS

## 2023-07-16 MED ORDER — SUGAMMADEX SODIUM 200 MG/2ML IV SOLN
INTRAVENOUS | Status: AC
  Filled 2023-07-16: qty 2

## 2023-07-16 MED ORDER — HEMOSTATIC AGENTS (NO CHARGE) OPTIME
TOPICAL | Status: DC | PRN
  Administered 2023-07-16 (×2): 1 via TOPICAL

## 2023-07-16 MED ORDER — CEFAZOLIN SODIUM-DEXTROSE 2-4 GM/100ML-% IV SOLN
2.0000 g | INTRAVENOUS | Status: AC
  Administered 2023-07-16: 2 g via INTRAVENOUS
  Filled 2023-07-16: qty 100

## 2023-07-16 MED ORDER — ASPIRIN 81 MG PO TBEC
81.0000 mg | DELAYED_RELEASE_TABLET | Freq: Every day | ORAL | Status: DC
  Administered 2023-07-17 – 2023-07-19 (×3): 81 mg via ORAL
  Filled 2023-07-16 (×3): qty 1

## 2023-07-16 SURGICAL SUPPLY — 61 items
BAG COUNTER SPONGE SURGICOUNT (BAG) ×2 IMPLANT
BALLN MUSTANG 5X120X135 (BALLOONS) ×2 IMPLANT
BALLN MUSTANG 6X80X75 (BALLOONS) ×2 IMPLANT
BALLOON MUSTANG 5X120X135 (BALLOONS) IMPLANT
BALLOON MUSTANG 6X80X75 (BALLOONS) IMPLANT
CANISTER SUCT 3000ML PPV (MISCELLANEOUS) ×2 IMPLANT
CATH BEACON 5 .035 65 KMP TIP (CATHETERS) IMPLANT
CATH EMB 4FR 40 (CATHETERS) ×1 IMPLANT
CATH OMNI FLUSH 5F 65CM (CATHETERS) ×2 IMPLANT
CATH QUICKCROSS SUPP .035X90CM (MICROCATHETER) ×1 IMPLANT
CHLORAPREP W/TINT 26 (MISCELLANEOUS) ×2 IMPLANT
CLIP LIGATING EXTRA MED SLVR (CLIP) ×2 IMPLANT
CLIP LIGATING EXTRA SM BLUE (MISCELLANEOUS) ×2 IMPLANT
CLOSURE MYNX CONTROL 6F/7F (Vascular Products) ×1 IMPLANT
COVER DOME SNAP 22 D (MISCELLANEOUS) ×1 IMPLANT
DERMABOND ADVANCED .7 DNX12 (GAUZE/BANDAGES/DRESSINGS) ×3 IMPLANT
DEVICE INFLATION ENCORE 26 (MISCELLANEOUS) ×1 IMPLANT
DRAIN CHANNEL 15F RND FF W/TCR (WOUND CARE) IMPLANT
DRAPE C-ARM 42X72 X-RAY (DRAPES) IMPLANT
ELECT REM PT RETURN 9FT ADLT (ELECTROSURGICAL) ×2 IMPLANT
ELECTRODE REM PT RTRN 9FT ADLT (ELECTROSURGICAL) ×1 IMPLANT
EVACUATOR SILICONE 100CC (DRAIN) IMPLANT
GLIDEWIRE ADV .035X260CM (WIRE) ×1 IMPLANT
GLOVE BIO SURGEON STRL SZ7.5 (GLOVE) ×2 IMPLANT
GOWN STRL REUS W/ TWL LRG LVL3 (GOWN DISPOSABLE) ×4 IMPLANT
GOWN STRL REUS W/ TWL XL LVL3 (GOWN DISPOSABLE) ×2 IMPLANT
GUIDEWIRE ANGLED .035X260CM (WIRE) ×1 IMPLANT
KIT BASIN OR (CUSTOM PROCEDURE TRAY) ×2 IMPLANT
KIT ENCORE 26 ADVANTAGE (KITS) IMPLANT
KIT TURNOVER KIT B (KITS) ×2 IMPLANT
NS IRRIG 1000ML POUR BTL (IV SOLUTION) ×4 IMPLANT
PACK ENDO MINOR (CUSTOM PROCEDURE TRAY) ×2 IMPLANT
PACK PERIPHERAL VASCULAR (CUSTOM PROCEDURE TRAY) ×2 IMPLANT
PAD ARMBOARD 7.5X6 YLW CONV (MISCELLANEOUS) ×4 IMPLANT
PENCIL BUTTON HOLSTER BLD 10FT (ELECTRODE) ×1 IMPLANT
POWDER SURGICEL 3.0 GRAM (HEMOSTASIS) ×2 IMPLANT
SET MICROPUNCTURE 5F STIFF (MISCELLANEOUS) ×1 IMPLANT
SHEATH FLEX ANSEL ANG 6F 45CM (SHEATH) ×1 IMPLANT
SHEATH PINNACLE 5F 10CM (SHEATH) ×1 IMPLANT
SHEATH PINNACLE 6F 10CM (SHEATH) ×1 IMPLANT
SHEATH PINNACLE 8F 10CM (SHEATH) ×2 IMPLANT
SPONGE T-LAP 18X18 ~~LOC~~+RFID (SPONGE) ×1 IMPLANT
STENT ELUVIA 6X120X130 (Permanent Stent) ×1 IMPLANT
STENT ELUVIA 6X60X130 (Permanent Stent) ×1 IMPLANT
STOPCOCK MORSE 400PSI 3WAY (MISCELLANEOUS) IMPLANT
SUT MNCRL AB 4-0 PS2 18 (SUTURE) ×3 IMPLANT
SUT PROLENE 5 0 C 1 24 (SUTURE) ×7 IMPLANT
SUT PROLENE 6 0 BV (SUTURE) ×7 IMPLANT
SUT VIC AB 2-0 CT1 TAPERPNT 27 (SUTURE) ×2 IMPLANT
SUT VIC AB 3-0 SH 27X BRD (SUTURE) ×3 IMPLANT
SYR 20ML LL LF (SYRINGE) ×1 IMPLANT
SYR 3ML LL SCALE MARK (SYRINGE) ×1 IMPLANT
SYR MEDRAD MARK V 150ML (SYRINGE) ×2 IMPLANT
TOWEL GREEN STERILE (TOWEL DISPOSABLE) ×4 IMPLANT
TOWEL GREEN STERILE FF (TOWEL DISPOSABLE) ×2 IMPLANT
TUBING HIGH PRESSURE 120CM (CONNECTOR) ×2 IMPLANT
UNDERPAD 30X36 HEAVY ABSORB (UNDERPADS AND DIAPERS) ×2 IMPLANT
WATER STERILE IRR 1000ML POUR (IV SOLUTION) ×2 IMPLANT
WIRE AMPLATZ SS-J .035X180CM (WIRE) IMPLANT
WIRE AMPLATZ SS-J .035X260CM (WIRE) IMPLANT
WIRE BENTSON .035X145CM (WIRE) ×2 IMPLANT

## 2023-07-16 NOTE — Care Management Important Message (Signed)
 Important Message  Patient Details  Name: Paul Madden MRN: [id number] Date of Birth: [birth year]   Important Message Given:  Yes - Medicare IM     [name] 07/16/2023, 11:55 AM

## 2023-07-16 NOTE — Op Note (Signed)
 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 Randie Heinz, MD Assistant: Aggie Moats, PA Procedure Performed: 1.  Extensive left common femoral endarterectomy including external iliac, profunda femoris and proximal superficial femoral artery endarterectomies with vein patch angioplasty including harvest of left anterior accessory saphenous vein 2.  Catheter selection of aorta and aortogram with left lower extremity angiography 3.  Stent of left external iliac artery with 6 x 60 mm Eluvia 4.  Stent of left SFA with 6 x 120 mm Eluvia 5.  Percutaneous ultrasound-guided access and Mynx device closure right common femoral artery  Indications: 68 year old male with a history of a right lower extremity bypass now has gangrene of his left fifth toe with left external iliac artery occlusion reconstituting the distal common femoral artery at the profunda and SFA bifurcation with occluded SFA with disease distal to this but three-vessel runoff to the foot distal to the occlusion.  He is now indicated for common femoral endarterectomy with retrograde and antegrade stenting of the external iliac artery and SFA.  Experience assistant was necessary to facilitate exposure of the left common femoral artery as well as perform extensive endarterectomy, harvest of the anterior excessively saphenous vein and performing patch angioplasty including following the Prolene suture and providing the necessary exposure.  He also was instrumental in passing wires, sheaths, catheters, stents and balloons during the endovascular portion of the case.  Findings: Left common femoral artery was densely calcified but also had some subacute appearing thrombus within it and was fully occluded with no initial inflow.  There was strong  backbleeding from 3 large branches of medial circumflex and 2 profunda branches which were able to perform endarterectomy and had very strong pulsatile backbleeding.  The SFA proximally was diseased and this was endarterectomized until there was smooth transition and there was strong backbleeding from the SFA.  We performed extensive endarterectomy up to the level of the external iliac artery and pulled out a very large plaque proximally and establish strong inflow.  An anterior sensory saphenous vein was harvested as a vein patch in the great saphenous vein was left for future needs of bypass if necessary.  On initial angiography the aorta and bilateral common iliac arteries were patent although densely calcified.  The external iliac artery after endarterectomy was patent although there was a dissection at the level of the hypogastric and this was stented to 0% residual stenosis and no dissection with brisk flow into the left common femoral artery.  The SFA was occluded for approximately 5 cm with distal SFA calcifications at the level of the abductor and this was stented to 0% residual stenosis with three-vessel runoff to the left lower extremity which was palpable at completion of the dorsalis pedis in the foot and posterior tibial at the ankle.   Procedure:  The patient was identified in the holding area and taken to the operating room where he is placed supine operative when general anesthesia was induced.  He was thoroughly prepped and draped in the bilateral groins and entire left lower extremity in usual fashion, antibiotics were administered and a timeout was called.  We began with a vertical incision overlying the palpable common femoral artery although there was no pulsation.  We dissected down to the common femoral artery excels dissected out the medial circumflex branch which was large as well as 2  large profunda branches encircled these with Vesseloops as well as the SFA after a few centimeters.  We  then dissected up onto the inguinal ligament and divided the crossing vein.  The patient was fully heparinized.  We open the common femoral artery which was known to be occluded.  As we dissected further down we did have backbleeding from the profunda branches after performing endarterectomy and these were all clamped.  We dissected high up under the inguinal ligament and performed extensive external iliac endarterectomy.  I was able to remove a very large plaque and establish pulsatile inflow and then placed a 4 Fogarty with a stopcock and completed endarterectomy at that level and was able to then clamped the external iliac artery high output of the ligament with a Henley clamp.  She the same incision identified the great saphenous vein but it much more diminutive anterior accessory saphenous vein this was tied off from the great saphenous vein leaving inline flow for future harvest.  Through a counterincision we then harvested approximately 10 cm of the anterior sensory saphenous vein and this was opened and spatulated.  I then sewed this in place with 5-0 Prolene suture as a vein patch angioplasty.  Upon completion we released all the clamps flushed with heparinized saline and then had pulsatile inflow.  Multiple repair stitches were required.  Using ultrasound guidance we then cannulated the common femoral artery on the right with a micropuncture needle followed by wire and a sheath and placed a Bentson wire followed by a 6 French sheath.  Omni catheter was placed the level of the terminal aorta and aortogram was performed with iliac angiography.  I attempted to cross the bifurcation with Bentson wire and Glidewire advantage and ultimately used a Glidewire with a quick cross up and over and then placed the Glidewire advantage into the SFA and placed a long 6 French sheath up and over the bifurcation.  Patient remained heparinized throughout this part of the case.  We then brought a 6 x 60 mm Eluvia up and over  the bifurcation retracted the sheath perform angiography and deployed this just at the level of the hypogastric artery.  This was postdilated with a 6 mm balloon to 0% residual stenosis or dissection.  The sheath was then advanced into the common femoral artery on the left and additional left lower extremity angiography was performed.  With the above findings we crossed the occluded SFA with Glidewire advantage and quick cross and confirmed intraluminal access and completed our angiography of the left lower extremity.  We then primarily stented with a 6 x 120 mm Eluvia postdilated with 5 mm balloon to the less than 20% residual stenosis and no dissection.  After several minutes there was actually a palpable dorsalis pedis and posterior tibial pulse with very strong Doppler signal.  Satisfied with this we administered protamine and obtained meticulous hemostasis in the left groin wounds.  We exchanged for a short 6 French sheath on the right and deployed a minx device and pressure was held until hemostasis was obtained.  On the left side we then closed in layers of Vicryl and Monocryl and Dermabond was placed at the skin level.  The patient was awakened from anesthesia having tolerated the procedure without any complication.  EBL: 500 cc  Contrast: 25 cc   Olga Seyler C. Randie Heinz, MD [facility] Office: [phone] Pager: [phone]

## 2023-07-16 NOTE — Anesthesia Postprocedure Evaluation (Signed)
 Anesthesia Post Note  Patient: Paul Madden  Procedure(s) Performed: LEFT FEMORAL ENDARTERECTOMY WITH RETROGRADE EXTERNAL ILIAC ARTERY STENTING AND ANTEGRADE SUPERFICIAL FEMORAL ARTERY STENTING (Left: Groin)     Patient location during evaluation: PACU Anesthesia Type: General Level of consciousness: awake and alert Pain management: pain level controlled Vital Signs Assessment: post-procedure vital signs reviewed and stable Respiratory status: spontaneous breathing, nonlabored ventilation, respiratory function stable and patient connected to nasal cannula oxygen Cardiovascular status: blood pressure returned to baseline and stable Postop Assessment: no apparent nausea or vomiting Anesthetic complications: no   No notable events documented.  Last Vitals:  Vitals:   07/16/23 1130 07/16/23 1156  BP: 124/79   Pulse: 64 66  Resp: (!) 23 17  Temp: 36.7 C (!) 36.4 C  SpO2: 95% 96%    Last Pain:  Vitals:   07/16/23 1156  TempSrc: Oral  PainSc:                  [name]

## 2023-07-16 NOTE — H&P (Signed)
 H+P   History of Present Illness: 68 y/o male with PAD and history of CLI right LE s/p Right common femoral endarterectomy, right fem BK popliteal bypass with vein and right GT amp on 10/24/21.  He missed his OP follow up.               He has no history of injury to the left foot.  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  He is not sure how long it has been there.  He is ambulatory with intermittent pain in the left fifth toe.  He denies frank claudication or rest pain.                He is positive for substance abuse.  Cocaine and opiates.                 He reported to the ED with pain and skin changes on the left foot.  He waas found to have leukopenia WBC 1.4.  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 The bypass graft appears patent, though the distal aspect is not well evaluated due to imaging out pacing the contrast bolus.         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 Outflow: There is reconstitution of the left common femoral artery at the level of the bifurcation via collaterals from the left internal iliac. The left superficial femoral and profundus femoral arteries are mildly decreasing caliber.  Past Medical History:  Diagnosis Date   Chronic low back pain    GERD (gastroesophageal reflux disease)    Hypertension     Past Surgical History:  Procedure Laterality Date   AMPUTATION TOE Right 10/24/2021   Procedure: AMPUTATION OF THE RIGHT GREAT TOE;  Surgeon: Maeola Harman, MD;  Location: [facility] OR;  Service: Vascular;  Laterality: Right;   FEMORAL-POPLITEAL BYPASS GRAFT Right 10/24/2021   Procedure: RIGHT BYPASS GRAFT COMMON FEMORAL-POPLITEAL ARTERY;  Surgeon: Maeola Harman, MD;  Location: [facility] OR;  Service: Vascular;  Laterality: Right;   LOWER EXTREMITY ANGIOGRAPHY N/A 06/20/2023    Procedure: Lower Extremity Angiography;  Surgeon: Cephus Shelling, MD;  Location: [facility] INVASIVE CV LAB;  Service: Cardiovascular;  Laterality: N/A;    No Known Allergies  Prior to Admission medications   Medication Sig Start Date End Date Taking? Authorizing Provider  amLODipine (NORVASC) 10 MG tablet Take 1 tablet (10 mg total) by mouth daily. 07/08/23  Yes Anabel Halon, MD  aspirin EC 81 MG tablet Take 1 tablet (81 mg total) by mouth daily. Swallow whole. 07/08/23  Yes Anabel Halon, MD  atorvastatin (LIPITOR) 40 MG tablet Take 1 tablet (40 mg total) by mouth at bedtime. 07/08/23  Yes Anabel Halon, MD  cyanocobalamin (VITAMIN B12) 1000 MCG tablet Take 1 tablet (1,000 mcg total) by mouth daily. 07/04/23  Yes Cindie Crumbly, MD  Ferrous Sulfate (IRON) 325 (65 Fe) MG TABS Take 1 tablet (325 mg total) by mouth every other day. 07/08/23  Yes Anabel Halon, MD  pantoprazole (PROTONIX) 40 MG tablet Take 1 tablet (40 mg total) by mouth daily. 07/03/23 10/01/23 Yes Cindie Crumbly, MD  ibuprofen (ADVIL) 600 MG tablet Take 1 tablet (600 mg total) by mouth every 8 (eight) hours as needed for moderate pain (pain score 4-6). 07/08/23  Anabel Halon, MD  nicotine (NICODERM CQ - DOSED IN MG/24 HOURS) 21 mg/24hr patch Place 1 patch (21 mg total) onto the skin daily. 06/21/23   Burnadette Pop, MD  polyethylene glycol powder (GLYCOLAX/MIRALAX) 17 GM/SCOOP powder Take 17 g by mouth daily. Patient taking differently: Take 17 g by mouth daily as needed for moderate constipation. 10/27/21   Rhetta Mura, MD    Social History   Socioeconomic History   Marital status: Single    Spouse name: Not on file   Number of children: Not on file   Years of education: Not on file   Highest education level: Not on file  Occupational History   Not on file  Tobacco Use   Smoking status: Every Day    Types: Cigarettes   Smokeless tobacco: Never  Vaping Use   Vaping status: Never Used  Substance and  Sexual Activity   Alcohol use: Yes    Comment: occ   Drug use: No   Sexual activity: Not on file  Other Topics Concern   Not on file  Social History Narrative   Not on file   Social Drivers of Health   Financial Resource Strain: Not on file  Food Insecurity: No Food Insecurity (06/18/2023)   Hunger Vital Sign    Worried About Running Out of Food in the Last Year: Never true    Ran Out of Food in the Last Year: Never true  Transportation Needs: No Transportation Needs (06/18/2023)   PRAPARE - Administrator, Civil Service (Medical): No    Lack of Transportation (Non-Medical): No  Physical Activity: Not on file  Stress: Not on file  Social Connections: Socially Isolated (06/20/2023)   Social Connection and Isolation Panel [NHANES]    Frequency of Communication with Friends and Family: Twice a week    Frequency of Social Gatherings with Friends and Family: Twice a week    Attends Religious Services: Never    Database administrator or Organizations: No    Attends Banker Meetings: Never    Marital Status: Never married  Intimate Partner Violence: Not At Risk (06/18/2023)   Humiliation, Afraid, Rape, and Kick questionnaire    Fear of Current or Ex-Partner: No    Emotionally Abused: No    Physically Abused: No    Sexually Abused: No    No family history on file.  ROS: no complaints  Physical Examination  Vitals:   07/16/23 0552  BP: (!) 143/75  Pulse: 69  Resp: 18  Temp: 98.1 F (36.7 C)  SpO2: 96%   Body mass index is 22.15 kg/m.  Aaox3 Non labored respirations Left 5th toe tip gangrene  CBC    Component Value Date/Time   WBC 2.6 (L) 07/10/2023 1020   RBC 3.64 (L) 07/10/2023 1020   HGB 10.0 (L) 07/10/2023 1020   HGB 9.1 (L) 11/01/2021 1542   HCT 32.3 (L) 07/10/2023 1020   HCT 28.4 (L) 11/01/2021 1542   PLT 489 (H) 07/10/2023 1020   PLT 790 (H) 11/01/2021 1542   MCV 88.7 07/10/2023 1020   MCV 89 11/01/2021 1542   MCH 27.5  07/10/2023 1020   MCHC 31.0 07/10/2023 1020   RDW 14.4 07/10/2023 1020   RDW 13.8 11/01/2021 1542   LYMPHSABS 0.8 07/03/2023 1022   LYMPHSABS 1.2 03/27/2021 1527   MONOABS 0.1 07/03/2023 1022   EOSABS 0.1 07/03/2023 1022   EOSABS 0.1 03/27/2021 1527   BASOSABS 0.0 07/03/2023 1022  BASOSABS 0.0 03/27/2021 1527    BMET    Component Value Date/Time   NA 143 07/10/2023 1020   NA 145 (H) 11/01/2021 1542   K 4.3 07/10/2023 1020   CL 102 07/10/2023 1020   CO2 29 07/10/2023 1020   GLUCOSE 106 (H) 07/10/2023 1020   BUN 10 07/10/2023 1020   BUN 19 11/01/2021 1542   CREATININE 0.95 07/10/2023 1020   CALCIUM 9.2 07/10/2023 1020   GFRNONAA >60 07/10/2023 1020    COAGS: Lab Results  Component Value Date   INR 1.1 10/23/2021     Non-Invasive Vascular Imaging:   ABI Findings:  +---------+------------------+-----+---------+---------+  Right   Rt Pressure (mmHg)IndexWaveform Comment    +---------+------------------+-----+---------+---------+  Brachial 118                    triphasic           +---------+------------------+-----+---------+---------+  PTA     125               1.02 triphasic           +---------+------------------+-----+---------+---------+  DP      125               1.02 triphasic           +---------+------------------+-----+---------+---------+  Great Toe58                0.47 Abnormal 2nd digit  +---------+------------------+-----+---------+---------+   +---------+------------------+-----+----------+-------+  Left    Lt Pressure (mmHg)IndexWaveform  Comment  +---------+------------------+-----+----------+-------+  Brachial 123                    triphasic          +---------+------------------+-----+----------+-------+  PTA     58                0.47 monophasic         +---------+------------------+-----+----------+-------+  DP      63                0.51 monophasic          +---------+------------------+-----+----------+-------+  Great Toe0                 0.00 Absent             +---------+------------------+-----+----------+-------+   +-------+-----------+-----------+------------+------------+  ABI/TBIToday's ABIToday's TBIPrevious ABIPrevious TBI  +-------+-----------+-----------+------------+------------+  Right 1.02                  0                         +-------+-----------+-----------+------------+------------+  Left  0.51       absent     0.65        0.05          +-------+-----------+-----------+------------+------------+       Summary:  Right: Resting right ankle-brachial index is within normal range.   Left: Resting left ankle-brachial index indicates moderate left lower  extremity arterial disease.    ASSESSMENT/PLAN: This is a 68 y.o. male with history of right lower extremity bypass now with triphasic waveforms.  He has toe tip ulceration of the left fifth toe and we are planning for left common femoral endarterectomy with stenting of his external iliac artery and SFA today in the OR versus possible bypass.  I have discussed the risk benefits alternatives he demonstrates good understanding and consent was signed.  Makel Mcmann C. Randie Heinz, MD  [facility] Office: [phone] Pager: [phone]

## 2023-07-16 NOTE — Anesthesia Procedure Notes (Signed)
 Procedure Name: Intubation Date/Time: 07/16/2023 7:46 AM  Performed by: Alease Medina, CRNAPre-anesthesia Checklist: Patient identified, Emergency Drugs available, Suction available and Patient being monitored Patient Re-evaluated:Patient Re-evaluated prior to induction Oxygen Delivery Method: Circle system utilized Preoxygenation: Pre-oxygenation with 100% oxygen Induction Type: IV induction Ventilation: Mask ventilation without difficulty Laryngoscope Size: Mac and 4 Grade View: Grade I Tube type: Oral Tube size: 7.0 mm Number of attempts: 1 Airway Equipment and Method: Stylet and Oral airway Placement Confirmation: ETT inserted through vocal cords under direct vision, positive ETCO2 and breath sounds checked- equal and bilateral Secured at: 22 cm Tube secured with: Tape Dental Injury: Teeth and Oropharynx as per pre-operative assessment

## 2023-07-16 NOTE — Transfer of Care (Signed)
 Immediate Anesthesia Transfer of Care Note  Patient: Paul Madden  Procedure(s) Performed: LEFT FEMORAL ENDARTERECTOMY WITH RETROGRADE EXTERNAL ILIAC ARTERY STENTING AND ANTEGRADE SUPERFICIAL FEMORAL ARTERY STENTING (Left: Groin)  Patient Location: PACU  Anesthesia Type:General  Level of Consciousness: drowsy  Airway & Oxygen Therapy: Patient Spontanous Breathing and Patient connected to face mask oxygen  Post-op Assessment: Report given to RN and Post -op Vital signs reviewed and stable  Post vital signs: Reviewed and stable  Last Vitals:  Vitals Value Taken Time  BP 144/69 07/16/23 1107  Temp    Pulse 76 07/16/23 1108  Resp 28 07/16/23 1109  SpO2 100 % 07/16/23 1108  Vitals shown include unfiled device data.  Last Pain:  Vitals:   07/16/23 0637  TempSrc:   PainSc: 0-No pain         Complications: No notable events documented.

## 2023-07-16 NOTE — Anesthesia Procedure Notes (Signed)
 Arterial Line Insertion Start/End2/18/2025 7:15 AM Performed by: Margarita Rana, CRNA  Patient location: Pre-op. Preanesthetic checklist: patient identified, IV checked, site marked, risks and benefits discussed, surgical consent, monitors and equipment checked, pre-op evaluation, timeout performed and anesthesia consent Lidocaine 1% used for infiltration Right, radial was placed Catheter size: 20 G Hand hygiene performed  and maximum sterile barriers used   Attempts: 1 Procedure performed without using ultrasound guided technique. Following insertion, dressing applied and Biopatch. Post procedure assessment: normal and unchanged  Patient tolerated the procedure well with no immediate complications.

## 2023-07-16 NOTE — Anesthesia Preprocedure Evaluation (Addendum)
 Anesthesia Evaluation  Patient identified by MRN, date of birth, ID band Patient awake    Reviewed: Allergy & Precautions, NPO status , Patient's Chart, lab work & pertinent test results  Airway Mallampati: II  TM Distance: >3 FB Neck ROM: Full    Dental no notable dental hx. (+) Edentulous Upper, Edentulous Lower   Pulmonary Current Smoker   Pulmonary exam normal        Cardiovascular hypertension, + Peripheral Vascular Disease   Rhythm:Regular Rate:Normal     Neuro/Psych negative neurological ROS  negative psych ROS   GI/Hepatic Neg liver ROS, PUD,GERD  ,,  Endo/Other    Renal/GU negative Renal ROS  negative genitourinary   Musculoskeletal  (+) Arthritis , Osteoarthritis,    Abdominal Normal abdominal exam  (+)   Peds  Hematology  (+) Blood dyscrasia, anemia   Anesthesia Other Findings   Reproductive/Obstetrics                             Anesthesia Physical Anesthesia Plan  ASA: 3  Anesthesia Plan: General   Post-op Pain Management:    Induction: Intravenous  PONV Risk Score and Plan: 1 and Ondansetron, Dexamethasone, Midazolam and Treatment may vary due to age or medical condition  Airway Management Planned: Mask and Oral ETT  Additional Equipment: Arterial line  Intra-op Plan:   Post-operative Plan: Extubation in OR  Informed Consent: I have reviewed the patients History and Physical, chart, labs and discussed the procedure including the risks, benefits and alternatives for the proposed anesthesia with the patient or authorized representative who has indicated his/her understanding and acceptance.     Dental advisory given  Plan Discussed with: CRNA  Anesthesia Plan Comments:        Anesthesia Quick Evaluation

## 2023-07-16 NOTE — Evaluation (Signed)
 Physical Therapy Evaluation Patient Details Name: Paul Madden MRN: [id number] DOB: 1955/08/29 Today's Date: 07/16/2023  History of Present Illness  Pt is a 69 y/o M admitted on 07/16/23 after presenting to the ED with c/o pain & skin changes in the L foot. Pt underwent L femoral endarterectomy with retrograde external iliac artery stenting & antegrade superficial femoral artery stenting on 07/16/23. PMH: PAD, chronic LBP, GERD, HTN, R great toe amputation  Clinical Impression  Pt seen for PT evaluation with pt received in bed, agreeable to tx. Pt reports prior to admission he was independent without AD, living with his caregiver in a 1 level home with ramped entry. On this date, pt is able to complete bed mobility with mod I, STS & step pivot to recliner without AD with supervision. Pt declined walking. Will continue to follow pt acutely to progress mobility as able.        If plan is discharge home, recommend the following: A little help with walking and/or transfers;A little help with bathing/dressing/bathroom;Assistance with cooking/housework;Help with stairs or ramp for entrance;Assist for transportation   Can travel by private vehicle        Equipment Recommendations Other (comment) (TBD)  Recommendations for Other Services       Functional Status Assessment Patient has had a recent decline in their functional status and demonstrates the ability to make significant improvements in function in a reasonable and predictable amount of time.     Precautions / Restrictions Precautions Precautions: Fall Restrictions Weight Bearing Restrictions Per Provider Order: No      Mobility  Bed Mobility Overal bed mobility: Modified Independent Bed Mobility: Supine to Sit     Supine to sit: Modified independent (Device/Increase time), HOB elevated          Transfers Overall transfer level: Needs assistance Equipment used: None Transfers: Sit to/from Stand, Bed to  chair/wheelchair/BSC Sit to Stand: Supervision   Step pivot transfers: Supervision (bed>recliner on L without AD)       General transfer comment: STS from EOB    Ambulation/Gait Ambulation/Gait assistance:  (pt declined)                Stairs            Wheelchair Mobility     Tilt Bed    Modified Rankin (Stroke Patients Only)       Balance Overall balance assessment: Needs assistance Sitting-balance support: Feet supported Sitting balance-Leahy Scale: Good     Standing balance support: During functional activity, No upper extremity supported Standing balance-Leahy Scale: Fair                               Pertinent Vitals/Pain Pain Assessment Pain Assessment: Faces Faces Pain Scale: Hurts a little bit Pain Location: L foot/baby toe Pain Descriptors / Indicators: Discomfort Pain Intervention(s): Monitored during session    Home Living Family/patient expects to be discharged to:: Private residence Living Arrangements: Non-relatives/Friends (pt reports he lives with his "caregiver")   Type of Home: House Home Access: Ramped entrance       Home Layout: One level   Additional Comments: Pt reports he has a room in a house & lives with his "caregiver". Reports he has access to a walker if he needs it.    Prior Function Prior Level of Function : Independent/Modified Independent             Mobility Comments: Ambulatory without  AD, denies falls ADLs Comments: Independent with dressing.     Extremity/Trunk Assessment   Upper Extremity Assessment Upper Extremity Assessment: Overall WFL for tasks assessed    Lower Extremity Assessment Lower Extremity Assessment: Generalized weakness;Overall WFL for tasks assessed;LLE deficits/detail LLE Deficits / Details: discolored L 5th toe    Cervical / Trunk Assessment Cervical / Trunk Assessment: Normal  Communication   Communication Communication: No apparent difficulties     Cognition Arousal: Alert Behavior During Therapy: Flat affect, WFL for tasks assessed/performed                             Following commands: Intact       Cueing       General Comments General comments (skin integrity, edema, etc.): no c/o adverse symptoms during session, VSS    Exercises     Assessment/Plan    PT Assessment Patient needs continued PT services  PT Problem List Decreased skin integrity;Decreased mobility;Decreased balance;Decreased activity tolerance       PT Treatment Interventions DME instruction;Balance training;Gait training;Neuromuscular re-education;Modalities;Stair training;Functional mobility training;Manual techniques;Therapeutic activities;Therapeutic exercise;Patient/family education    PT Goals (Current goals can be found in the Care Plan section)  Acute Rehab PT Goals Patient Stated Goal: to get back in the bed PT Goal Formulation: With patient Time For Goal Achievement: 07/30/23 Potential to Achieve Goals: Good    Frequency Min 1X/week     Co-evaluation               AM-PAC PT "6 Clicks" Mobility  Outcome Measure Help needed turning from your back to your side while in a flat bed without using bedrails?: None Help needed moving from lying on your back to sitting on the side of a flat bed without using bedrails?: None Help needed moving to and from a bed to a chair (including a wheelchair)?: A Little Help needed standing up from a chair using your arms (e.g., wheelchair or bedside chair)?: A Little Help needed to walk in hospital room?: A Little Help needed climbing 3-5 steps with a railing? : A Little 6 Click Score: 20    End of Session   Activity Tolerance: Patient tolerated treatment well Patient left: in chair;with chair alarm set;with call bell/phone within reach Nurse Communication: Mobility status PT Visit Diagnosis: Other abnormalities of gait and mobility (R26.89)    Time: 4696-2952 PT Time  Calculation (min) (ACUTE ONLY): 11 min   Charges:   PT Evaluation $PT Eval Low Complexity: 1 Low   PT General Charges $$ ACUTE PT VISIT: 1 Visit         Aleda Grana, PT, DPT 07/16/23, 3:54 PM   Sandi Mariscal 07/16/2023, 3:53 PM

## 2023-07-16 NOTE — Evaluation (Signed)
 Occupational Therapy Evaluation Patient Details Name: Paul Madden MRN: [id number] DOB: 1955/07/21 Today's Date: 07/16/2023   History of Present Illness   Pt is a 68 y/o M admitted on 07/16/23 after presenting to the ED with c/o pain & skin changes in the L foot. Pt underwent L femoral endarterectomy with retrograde external iliac artery stenting & antegrade superficial femoral artery stenting on 07/16/23. PMH: PAD, chronic LBP, GERD, HTN, R great toe amputation     Clinical Impressions Pt reports significant pain to LLE, but able to take steps without evidence of intense pain. Pt states he rents a room from non-relatives, PLOF independent, when asked if housemates could assist if they had to he repeated he won't need any help upon DC home. Pt currently at supervision level for ADLs and transfer to recliner with no AD, did not want to ambulate to restroom or attempt further OOB activities. Pt would benefit form further acute OT to ensure Pt is at a level to perform OOB activity needed to remain independent in home, no OT follow up expected. Pt states he has no DME needs.      If plan is discharge home, recommend the following:   A little help with walking and/or transfers;A little help with bathing/dressing/bathroom;Assistance with cooking/housework;Assist for transportation;Help with stairs or ramp for entrance     Functional Status Assessment   Patient has had a recent decline in their functional status and demonstrates the ability to make significant improvements in function in a reasonable and predictable amount of time.     Equipment Recommendations   Other (comment) (TBD)     Recommendations for Other Services         Precautions/Restrictions   Precautions Precautions: Fall Restrictions Weight Bearing Restrictions Per Provider Order: No     Mobility Bed Mobility Overal bed mobility: Modified Independent                  Transfers Overall  transfer level: Needs assistance Equipment used: None Transfers: Sit to/from Stand Sit to Stand: Supervision     Step pivot transfers: Supervision     General transfer comment: supervision for safety      Balance Overall balance assessment: Needs assistance Sitting-balance support: Feet supported Sitting balance-Leahy Scale: Good     Standing balance support: During functional activity, No upper extremity supported Standing balance-Leahy Scale: Fair Standing balance comment: stands unsupported without LOB                           ADL either performed or assessed with clinical judgement   ADL Overall ADL's : At baseline;Needs assistance/impaired                                       General ADL Comments: set up/supervision for safety, did not want to perform OOB activity or ambulate more than a few feet.     Vision         Perception         Praxis         Pertinent Vitals/Pain Pain Assessment Pain Assessment: Faces Faces Pain Scale: Hurts little more Pain Location: L foot/baby toe Pain Descriptors / Indicators: Discomfort Pain Intervention(s): Monitored during session     Extremity/Trunk Assessment Upper Extremity Assessment Upper Extremity Assessment: Overall WFL for tasks assessed   Lower Extremity Assessment Lower Extremity Assessment:  Defer to PT evaluation LLE Deficits / Details: discolored L 5th toe   Cervical / Trunk Assessment Cervical / Trunk Assessment: Normal   Communication Communication Communication: No apparent difficulties   Cognition Arousal: Alert Behavior During Therapy: WFL for tasks assessed/performed Cognition: No apparent impairments             OT - Cognition Comments: Pt eager to rest and relax for the day, did not want to perform too much OOB activities or answer too many questions                 Following commands: Intact       Cueing  General Comments           Exercises     Shoulder Instructions      Home Living Family/patient expects to be discharged to:: Private residence Living Arrangements: Non-relatives/Friends Available Help at Discharge: Friend(s) Type of Home: House Home Access: Ramped entrance     Home Layout: One level                   Additional Comments: Pt reports he lives in someones house and is fully independent and doesn't receive any help from anyone      Prior Functioning/Environment Prior Level of Function : Independent/Modified Independent             Mobility Comments: Ambulatory without AD, denies falls ADLs Comments: Independent with dressing.    OT Problem List: Decreased strength;Decreased activity tolerance;Pain   OT Treatment/Interventions: Self-care/ADL training;Therapeutic exercise;DME and/or AE instruction;Therapeutic activities      OT Goals(Current goals can be found in the care plan section)   Acute Rehab OT Goals Patient Stated Goal: to return home OT Goal Formulation: With patient Time For Goal Achievement: 07/30/23 Potential to Achieve Goals: Good   OT Frequency:  Min 1X/week    Co-evaluation              AM-PAC OT "6 Clicks" Daily Activity     Outcome Measure Help from another person eating meals?: None Help from another person taking care of personal grooming?: A Little Help from another person toileting, which includes using toliet, bedpan, or urinal?: A Little Help from another person bathing (including washing, rinsing, drying)?: A Little Help from another person to put on and taking off regular upper body clothing?: A Little Help from another person to put on and taking off regular lower body clothing?: A Little 6 Click Score: 19   End of Session Nurse Communication: Mobility status  Activity Tolerance: Patient limited by pain;Patient limited by fatigue Patient left: in bed;with call bell/phone within reach;with nursing/sitter in room  OT Visit  Diagnosis: Other abnormalities of gait and mobility (R26.89);Pain Pain - Right/Left: Left Pain - part of body: Ankle and joints of foot                Time: 1610-9604 OT Time Calculation (min): 16 min Charges:  OT General Charges $OT Visit: 1 Visit OT Evaluation $OT Eval Low Complexity: 1 Low  [address] OTR/L   Alexis Goodell 07/16/2023, 4:27 PM

## 2023-07-17 ENCOUNTER — Ambulatory Visit: Payer: Medicare HMO

## 2023-07-17 ENCOUNTER — Encounter (HOSPITAL_COMMUNITY): Payer: Self-pay | Admitting: Vascular Surgery

## 2023-07-17 LAB — CBC
HCT: 23.7 % — ABNORMAL LOW (ref 39.0–52.0)
Hemoglobin: 7.6 g/dL — ABNORMAL LOW (ref 13.0–17.0)
MCH: 27.9 pg (ref 26.0–34.0)
MCHC: 32.1 g/dL (ref 30.0–36.0)
MCV: 87.1 fL (ref 80.0–100.0)
Platelets: 389 10*3/uL (ref 150–400)
RBC: 2.72 MIL/uL — ABNORMAL LOW (ref 4.22–5.81)
RDW: 15 % (ref 11.5–15.5)
WBC: 2.2 10*3/uL — ABNORMAL LOW (ref 4.0–10.5)
nRBC: 0 % (ref 0.0–0.2)

## 2023-07-17 LAB — BASIC METABOLIC PANEL
Anion gap: 8 (ref 5–15)
BUN: 16 mg/dL (ref 8–23)
CO2: 25 mmol/L (ref 22–32)
Calcium: 8.6 mg/dL — ABNORMAL LOW (ref 8.9–10.3)
Chloride: 103 mmol/L (ref 98–111)
Creatinine, Ser: 1.56 mg/dL — ABNORMAL HIGH (ref 0.61–1.24)
GFR, Estimated: 48 mL/min — ABNORMAL LOW (ref >60)
Glucose, Bld: 95 mg/dL (ref 70–99)
Potassium: 4.6 mmol/L (ref 3.5–5.1)
Sodium: 136 mmol/L (ref 135–145)

## 2023-07-17 NOTE — Progress Notes (Incomplete)
 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    Working on pain control, still required IV push ASA, Plavix, and Statin    Paul Madden 07/17/2023 7:54 AM --  Laboratory Lab Results: Recent Labs    07/16/23 1200 07/17/23 0623  WBC 3.0* 2.2*  HGB 8.6* 7.6*  HCT 27.0* 23.7*  PLT 469* 389   BMET Recent Labs    07/16/23 0910 07/16/23 1200 07/17/23 0623  NA 138  --  136  K 4.6  --  4.6  CL  --   --  103  CO2  --   --  25  GLUCOSE  --   --  95  BUN  --   --  16  CREATININE  --  1.42* 1.56*  CALCIUM  --   --  8.6*    COAG Lab Results  Component Value Date   INR 1.1 10/23/2021   No results found for: "PTT"

## 2023-07-17 NOTE — Progress Notes (Addendum)
 Occupational Therapy Treatment/Discharge Patient Details Name: Paul Madden MRN: [id number] DOB: 12/17/1955 Today's Date: 07/17/2023   History of present illness Pt is a 68 y/o M admitted on 07/16/23 after presenting to the ED with c/o pain & skin changes in the L foot. Pt underwent L femoral endarterectomy with retrograde external iliac artery stenting & antegrade superficial femoral artery stenting on 07/16/23. PMH: PAD, chronic LBP, GERD, HTN, R great toe amputation   OT comments  Pt c/o moderate pain to L 5th toe, and eager to return home. Pt irritable and did not want to be bothered or participate, but agreeable to walk up/down hall. Pt impulsive to stand, saying he won't fall and doesn't need a gait belt, "if I fall I fall," and started walking out of room. Pt has slight limp and c/o pain to L 5th toe, but no overt LOB. Pt states he has no further OT needs, no DME needs, and ready to go home. Pt at supervision level currently for safety but will likely succeed in home setting, demonstrates ability to gather and transport items to perform dressing/bathing as needed at supervision for ambulation. Signing off.       If plan is discharge home, recommend the following:  A little help with walking and/or transfers;Assistance with cooking/housework;Assist for transportation;Help with stairs or ramp for entrance   Equipment Recommendations  None recommended by OT    Recommendations for Other Services      Precautions / Restrictions Precautions Precautions: Fall Restrictions Weight Bearing Restrictions Per Provider Order: No       Mobility Bed Mobility Overal bed mobility: Independent                  Transfers Overall transfer level: Needs assistance Equipment used: None Transfers: Sit to/from Stand, Bed to chair/wheelchair/BSC Sit to Stand: Supervision           General transfer comment: supervision for safety, but Pt refuses assistance and insists to do it on  his own. "If I fall I fall"     Balance Overall balance assessment: Needs assistance Sitting-balance support: No upper extremity supported, Feet supported Sitting balance-Leahy Scale: Good     Standing balance support: No upper extremity supported Standing balance-Leahy Scale: Fair Standing balance comment: stands unsupported, no LOB                           ADL either performed or assessed with clinical judgement   ADL Overall ADL's : Modified independent                                            Extremity/Trunk Assessment Upper Extremity Assessment Upper Extremity Assessment: Overall WFL for tasks assessed            Vision       Perception     Praxis     Communication Communication Communication: No apparent difficulties   Cognition Arousal: Alert Behavior During Therapy: Impulsive, Agitated Cognition: No apparent impairments             OT - Cognition Comments: irriatated yesterday and today, did not want to participate but agreeable to ambulate a little.                 Following commands: Intact        Cueing  Exercises      Shoulder Instructions       General Comments      Pertinent Vitals/ Pain       Pain Assessment Pain Assessment: Faces Faces Pain Scale: Hurts little more Pain Location: L foot/baby toe Pain Descriptors / Indicators: Discomfort Pain Intervention(s): Monitored during session  Home Living                                          Prior Functioning/Environment              Frequency  Min 1X/week        Progress Toward Goals  OT Goals(current goals can now be found in the care plan section)  Progress towards OT goals: Goals met/education completed, patient discharged from OT  Acute Rehab OT Goals Patient Stated Goal: to return home OT Goal Formulation: With patient Time For Goal Achievement: 07/30/23 Potential to Achieve Goals: Good ADL  Goals Pt Will Perform Tub/Shower Transfer: Independently Additional ADL Goal #1: Pt will be able to gather/transport items needed for dressing/bathing indepedently to return to PLOF while maintaining safety awareness  Plan      Co-evaluation                 AM-PAC OT "6 Clicks" Daily Activity     Outcome Measure   Help from another person eating meals?: None Help from another person taking care of personal grooming?: A Little Help from another person toileting, which includes using toliet, bedpan, or urinal?: A Little Help from another person bathing (including washing, rinsing, drying)?: A Little Help from another person to put on and taking off regular upper body clothing?: A Little Help from another person to put on and taking off regular lower body clothing?: A Little 6 Click Score: 19    End of Session    OT Visit Diagnosis: Other abnormalities of gait and mobility (R26.89);Pain Pain - Right/Left: Left Pain - part of body: Ankle and joints of foot   Activity Tolerance Patient tolerated treatment well   Patient Left in bed;with call bell/phone within reach   Nurse Communication Mobility status;Patient requests pain meds        Time: 1410-1420 OT Time Calculation (min): 10 min  Charges: OT General Charges $OT Visit: 1 Visit OT Treatments $Therapeutic Activity: 8-22 mins  [address] OTR/L   Alexis Goodell 07/17/2023, 2:27 PM

## 2023-07-17 NOTE — Progress Notes (Signed)
  Progress Note    07/17/2023 1:46 PM 1 Day Post-Op  Subjective: No overnight issues  Vitals:   07/17/23 0815 07/17/23 1218  BP: (!) 107/59 110/68  Pulse: 62 73  Resp: 20 20  Temp: 98.5 F (36.9 C) 98.6 F (37 C)  SpO2: 95% 98%    Physical Exam: Awake alert oriented On the respirations Right groin with minimal hematoma Left femoral incision healing well 1+ palpable left DP and PT  CBC    Component Value Date/Time   WBC 2.2 (L) 07/17/2023 0623   RBC 2.72 (L) 07/17/2023 0623   HGB 7.6 (L) 07/17/2023 0623   HGB 9.1 (L) 11/01/2021 1542   HCT 23.7 (L) 07/17/2023 0623   HCT 28.4 (L) 11/01/2021 1542   PLT 389 07/17/2023 0623   PLT 790 (H) 11/01/2021 1542   MCV 87.1 07/17/2023 0623   MCV 89 11/01/2021 1542   MCH 27.9 07/17/2023 0623   MCHC 32.1 07/17/2023 0623   RDW 15.0 07/17/2023 0623   RDW 13.8 11/01/2021 1542   LYMPHSABS 0.8 07/03/2023 1022   LYMPHSABS 1.2 03/27/2021 1527   MONOABS 0.1 07/03/2023 1022   EOSABS 0.1 07/03/2023 1022   EOSABS 0.1 03/27/2021 1527   BASOSABS 0.0 07/03/2023 1022   BASOSABS 0.0 03/27/2021 1527    BMET    Component Value Date/Time   NA 136 07/17/2023 0623   NA 145 (H) 11/01/2021 1542   K 4.6 07/17/2023 0623   CL 103 07/17/2023 0623   CO2 25 07/17/2023 0623   GLUCOSE 95 07/17/2023 0623   BUN 16 07/17/2023 0623   BUN 19 11/01/2021 1542   CREATININE 1.56 (H) 07/17/2023 0623   CALCIUM 8.6 (L) 07/17/2023 0623   GFRNONAA 48 (L) 07/17/2023 0623    INR    Component Value Date/Time   INR 1.1 10/23/2021 1629     Intake/Output Summary (Last 24 hours) at 07/17/2023 1346 Last data filed at 07/17/2023 1221 Gross per 24 hour  Intake 1142.01 ml  Output 1155 ml  Net -12.99 ml     Assessment:  68 y.o. male is s/p left common femoral endarterectomy with stenting of the left external iliac artery and left SFA for toe ulceration  Plan: Ambulate today P.o. pain control   Keric Zehren C. Randie Heinz, MD [facility] Office: [phone] Pager: [phone]  07/17/2023 1:46 PM

## 2023-07-18 ENCOUNTER — Other Ambulatory Visit (HOSPITAL_COMMUNITY): Payer: Self-pay

## 2023-07-18 MED ORDER — CLOPIDOGREL BISULFATE 75 MG PO TABS
75.0000 mg | ORAL_TABLET | Freq: Every day | ORAL | 11 refills | Status: AC
  Filled 2023-07-18: qty 30, 30d supply, fill #0

## 2023-07-18 MED ORDER — OXYCODONE-ACETAMINOPHEN 5-325 MG PO TABS
1.0000 | ORAL_TABLET | Freq: Four times a day (QID) | ORAL | 0 refills | Status: AC | PRN
  Filled 2023-07-18: qty 30, 7d supply, fill #0

## 2023-07-18 NOTE — Progress Notes (Signed)
 Patient states that he was told by provider that he would go home on Friday 2/21 and he now does not feel well enough to DC until Friday 2/21.  Patient uses [employer] who requires a 24-hour notice to set up transportation.  Juel Burrow was called and message was left regarding patient's need for transportation from hospital to home Friday 2/21. Provider updated. Moshe Salisbury RN

## 2023-07-18 NOTE — Progress Notes (Signed)
 PT Cancellation Note  Patient Details Name: Paul Madden MRN: [id number] DOB: Aug 22, 1955   Cancelled Treatment:    Reason Eval/Treat Not Completed: Patient declined, stated he did not want to do physical therapy. Pt reports moving multiple times throughout the day. Acute PT to follow and re-attempt as able.  Hilton Cork, PT, DPT Secure Chat Preferred  Rehab Office [phone]   Arturo Morton Brion Aliment 07/18/2023, 3:39 PM

## 2023-07-18 NOTE — Consult Note (Signed)
 [facility] Liaison Consult Note   07/18/2023  Paul Madden 02-Mar-1956 [id number]  Insurance: [employer]   Primary Care Provider: Anabel Halon, MD, with [facility], this provider is listed for the transition of care follow up appointments  and [facility] calls   [facility] Liaison met patient at bedside at [facility]. Spoke with patient regarding reason for follow up for post hospital follow up needs. Patient was accepting and expressed appreciation.   The patient was screened for 30 day readmission hospitalization with noted high risk score for unplanned readmission risk 1ED and 2 hospital admissions in 6 months.  The patient was assessed for potential Community Care Coordination service needs for post hospital transition for care coordination. Review of patient's electronic medical record reveals patient is for home with home health.   Plan: [facility] Liaison will continue to follow progress and disposition to asess for post hospital community care coordination/management needs.  Referral request for community care coordination: Anticipate [facility] TOC calls and offer ongoing follow up.  Patient states he feels like he will be fine.  Patient was encouraged to follow up with PCP and he states he would and has no issues with getting to appointments.   [facility] Community Care, Population Health does not replace or interfere with any arrangements made by the Inpatient Transition of Care team.   For questions contact:   Charlesetta Shanks, RN, BSN, CCM [place]  [facility], [facility] Liaison Direct Dial: [phone] or secure chat Email: [email or web address]

## 2023-07-18 NOTE — TOC Transition Note (Signed)
 Transition of Care [facility]) - Discharge Note Donn Pierini RN, BSN Transitions of Care Unit 4E- RN Case Manager See Treatment Team for direct phone #   Patient Details  Name: Paul Madden MRN: [id number] Date of Birth: 1955/07/20  Transition of Care [facility]) CM/SW Contact:  Darrold Span, RN Phone Number: 07/18/2023, 1:01 PM   Clinical Narrative:    Noted d/c order has been placed. CM spoke with pt at bedside to discuss HH/DME needs.   Per pt he states he does not need HH at this time, and voiced he has needed DME at home. Pt states he uses [employer] out of [place]-[phone] and that they need 24hr notice to schedule ride.  Pt states he has no one else to assist with transportation.    Bedside RN to follow up on discharge readiness with MD.   No TOC needs noted at this time   Final next level of care: Home/Self Care Barriers to Discharge: Barriers Resolved   Patient Goals and CMS Choice Patient states their goals for this hospitalization and ongoing recovery are:: return home   Choice offered to / list presented to : NA      Discharge Placement               Home        Discharge Plan and Services Additional resources added to the After Visit Summary for     Discharge Planning Services: CM Consult Post Acute Care Choice: NA          DME Arranged: N/A DME Agency: NA       HH Arranged: PT, Patient Refused HH HH Agency: NA        Social Drivers of Health (SDOH) Interventions SDOH Screenings   Food Insecurity: No Food Insecurity (07/16/2023)  Housing: Low Risk  (07/16/2023)  Transportation Needs: No Transportation Needs (07/16/2023)  Utilities: Not At Risk (07/16/2023)  Depression (PHQ2-9): Low Risk  (07/08/2023)  Social Connections: Socially Isolated (07/16/2023)  Tobacco Use: High Risk (07/10/2023)     Readmission Risk Interventions    07/18/2023    1:00 PM  Readmission Risk Prevention Plan  Transportation  Screening Complete  HRI or Home Care Consult Complete  Social Work Consult for Recovery Care Planning/Counseling Complete  Palliative Care Screening Not Applicable  Medication Review [employer]) Complete

## 2023-07-18 NOTE — Discharge Instructions (Signed)
 [facility]  Discharge instructions  Lower Extremity Bypass Surgery  Please refer to the following instruction for your post-procedure care. Your surgeon or physician assistant will discuss any changes with you.  Activity  You are encouraged to walk as much as you can. You can slowly return to normal activities during the month after your surgery. Avoid strenuous activity and heavy lifting until your doctor tells you it's OK. Avoid activities such as vacuuming or swinging a golf club. Do not drive until your doctor give the OK and you are no longer taking prescription pain medications. It is also normal to have difficulty with sleep habits, eating and bowel movement after surgery. These will go away with time.  Bathing/Showering  You may shower after you go home. Do not soak in a bathtub, hot tub, or swim until the incision heals completely.  Incision Care  Clean your incision with mild soap and water. Shower every day. Pat the area dry with a clean towel. You do not need a bandage unless otherwise instructed. Do not apply any ointments or creams to your incision. If you have open wounds you will be instructed how to care for them or a visiting nurse may be arranged for you. If you have staples or sutures along your incision they will be removed at your post-op appointment. You may have skin glue on your incision. Do not peel it off. It will come off on its own in about one week. If you have a great deal of moisture in your groin, use a gauze help keep this area dry.  Diet  Resume your normal diet. There are no special food restrictions following this procedure. A low fat/ low cholesterol diet is recommended for all patients with vascular disease. In order to heal from your surgery, it is CRITICAL to get adequate nutrition. Your body requires vitamins, minerals, and protein. Vegetables are the best source of vitamins and minerals. Vegetables also provide the  perfect balance of protein. Processed food has little nutritional value, so try to avoid this.  Medications  Resume taking all your medications unless your doctor or nurse practitioner tells you not to. If your incision is causing pain, you may take over-the-counter pain relievers such as acetaminophen (Tylenol). If you were prescribed a stronger pain medication, please aware these medication can cause nausea and constipation. Prevent nausea by taking the medication with a snack or meal. Avoid constipation by drinking plenty of fluids and eating foods with high amount of fiber, such as fruits, vegetables, and grains. Take Colase 100 mg (an over-the-counter stool softener) twice a day as needed for constipation. Do not take Tylenol if you are taking prescription pain medications.  Follow Up  Our office will schedule a follow up appointment 2-3 weeks following discharge.  Please call us immediately for any of the following conditions  Severe or worsening pain in your legs or feet while at rest or while walking Increase pain, redness, warmth, or drainage (pus) from your incision site(s) Fever of 101 degree or higher The swelling in your leg with the bypass suddenly worsens and becomes more painful than when you were in the hospital If you have been instructed to feel your graft pulse then you should do so every day. If you can no longer feel this pulse, call the office immediately. Not all patients are given this instruction.  Leg swelling is common after leg bypass surgery.  The swelling should improve over a few months  following surgery. To improve the swelling, you may elevate your legs above the level of your heart while you are sitting or resting. Your surgeon or physician assistant may ask you to apply an ACE wrap or wear compression (TED) stockings to help to reduce swelling.  Reduce your risk of vascular disease  Stop smoking. If you would like help call [facility] at 1-800-QUIT-NOW  ((470) 654-9347) or [place] at (807)887-0074.  Manage your cholesterol Maintain a desired weight Control your diabetes weight Control your diabetes Keep your blood pressure down  If you have any questions, please call the office at [phone]

## 2023-07-18 NOTE — Progress Notes (Addendum)
 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     Working on pain control, still required IV push ASA, Plavix, and Statin      [name] 07/18/2023 9:08 AM --  Laboratory Lab Results: Recent Labs    07/16/23 1200 07/17/23 0623  WBC 3.0* 2.2*  HGB 8.6* 7.6*  HCT 27.0* 23.7*  PLT 469* 389   BMET Recent Labs    07/16/23 0910 07/16/23 1200 07/17/23 0623  NA 138  --  136  K 4.6  --  4.6  CL  --   --  103  CO2  --   --  25  GLUCOSE  --   --  95  BUN  --   --  16  CREATININE  --  1.42* 1.56*  CALCIUM  --   --  8.6*    COAG Lab Results  Component Value Date   INR 1.1 10/23/2021   No results found for: "PTT"  I have independently interviewed and examined patient and agree with PA assessment and plan above.  Healing well in the left groin small hematoma on the right possibly also has a hernia there.  1+ palpable dorsalis pedis and posterior tibial on the left and a 2+ palpable right dorsalis pedis where he has previously been bypassed.  Continue mobilization and pain control and plan for discharge home soon.  Cathalina Barcia C. Randie Heinz, MD [facility] [place] Office: [phone] Pager: [phone]

## 2023-07-19 ENCOUNTER — Other Ambulatory Visit (HOSPITAL_COMMUNITY): Payer: Self-pay

## 2023-07-19 LAB — CBC
HCT: 27.2 % — ABNORMAL LOW (ref 39.0–52.0)
Hemoglobin: 8.8 g/dL — ABNORMAL LOW (ref 13.0–17.0)
MCH: 27.9 pg (ref 26.0–34.0)
MCHC: 32.4 g/dL (ref 30.0–36.0)
MCV: 86.3 fL (ref 80.0–100.0)
Platelets: 536 10*3/uL — ABNORMAL HIGH (ref 150–400)
RBC: 3.15 MIL/uL — ABNORMAL LOW (ref 4.22–5.81)
RDW: 14.6 % (ref 11.5–15.5)
WBC: 2.3 10*3/uL — ABNORMAL LOW (ref 4.0–10.5)
nRBC: 0 % (ref 0.0–0.2)

## 2023-07-19 NOTE — Progress Notes (Addendum)
  Progress Note    07/19/2023 8:27 AM 3 Days Post-Op  Subjective:  ready to go home   Vitals:   07/19/23 0350 07/19/23 0808  BP: 127/85 (!) 140/80  Pulse: 76 75  Resp: 20 15  Temp: 97.7 F (36.5 C) 98.8 F (37.1 C)  SpO2: (!) 77% 95%   Physical Exam: Cardiac:  regular Lungs:  non labored Incisions:  left groin incision is intact and well appearing Extremities: R CF access site soft, small hematoma unchanged. BLE well perfused and warm with bilateral palpable DP pulses Abdomen:  soft, flat Neurologic: alert and oriented  CBC    Component Value Date/Time   WBC 2.2 (L) 07/17/2023 0623   RBC 2.72 (L) 07/17/2023 0623   HGB 7.6 (L) 07/17/2023 0623   HGB 9.1 (L) 11/01/2021 1542   HCT 23.7 (L) 07/17/2023 0623   HCT 28.4 (L) 11/01/2021 1542   PLT 389 07/17/2023 0623   PLT 790 (H) 11/01/2021 1542   MCV 87.1 07/17/2023 0623   MCV 89 11/01/2021 1542   MCH 27.9 07/17/2023 0623   MCHC 32.1 07/17/2023 0623   RDW 15.0 07/17/2023 0623   RDW 13.8 11/01/2021 1542   LYMPHSABS 0.8 07/03/2023 1022   LYMPHSABS 1.2 03/27/2021 1527   MONOABS 0.1 07/03/2023 1022   EOSABS 0.1 07/03/2023 1022   EOSABS 0.1 03/27/2021 1527   BASOSABS 0.0 07/03/2023 1022   BASOSABS 0.0 03/27/2021 1527    BMET    Component Value Date/Time   NA 136 07/17/2023 0623   NA 145 (H) 11/01/2021 1542   K 4.6 07/17/2023 0623   CL 103 07/17/2023 0623   CO2 25 07/17/2023 0623   GLUCOSE 95 07/17/2023 0623   BUN 16 07/17/2023 0623   BUN 19 11/01/2021 1542   CREATININE 1.56 (H) 07/17/2023 0623   CALCIUM 8.6 (L) 07/17/2023 0623   GFRNONAA 48 (L) 07/17/2023 0623    INR    Component Value Date/Time   INR 1.1 10/23/2021 1629     Intake/Output Summary (Last 24 hours) at 07/19/2023 0827 Last data filed at 07/19/2023 0809 Gross per 24 hour  Intake 605 ml  Output 790 ml  Net -185 ml     Assessment/Plan:  68 y.o. male is s/p 1.  Extensive left common femoral endarterectomy including external iliac,  profunda femoris and proximal superficial femoral artery endarterectomies with vein patch angioplasty including harvest of left anterior accessory saphenous vein 2.  Catheter selection of aorta and aortogram with left lower extremity angiography 3.  Stent of left external iliac artery with 6 x 60 mm Eluvia 4.  Stent of left SFA with 6 x 120 mm Eluvia 3 Days Post-Op   LLE remains well perfused and warm with palpable DP pulse Left groin incision is intact and well appearing Right CF access site soft, small hematoma present, unchanged Pain better controlled Hemodynamically stable Will check morning labs prior to d/c. Hgb was 7.6 2/19. Has been Asymptomatic Ready to go home He will remain on Aspirin, Plavix, Statin He is stable for discharge home today pending transportation availability Follow up in 2-3 weeks for incision check    Dory Horn [facility] [phone] 07/19/2023 8:27 AM   I have interviewed and examined patient with PA and agree with assessment and plan above. Palpable left pt and wounds healing well.  Lynnette Pote C. Randie Heinz, MD [facility] [place] Office: [phone] Pager: [phone]

## 2023-07-19 NOTE — Plan of Care (Signed)
  Problem: Health Behavior/Discharge Planning: Goal: Ability to manage health-related needs will improve Outcome: Progressing   Problem: Clinical Measurements: Goal: Ability to maintain clinical measurements within normal limits will improve Outcome: Progressing Goal: Will remain free from infection Outcome: Progressing Goal: Diagnostic test results will improve Outcome: Progressing Goal: Respiratory complications will improve Outcome: Progressing Goal: Cardiovascular complication will be avoided Outcome: Progressing   Problem: Activity: Goal: Risk for activity intolerance will decrease Outcome: Progressing   Problem: Nutrition: Goal: Adequate nutrition will be maintained Outcome: Progressing   Problem: Coping: Goal: Level of anxiety will decrease Outcome: Progressing   Problem: Elimination: Goal: Will not experience complications related to bowel motility Outcome: Progressing Goal: Will not experience complications related to urinary retention Outcome: Progressing   Problem: Pain Managment: Goal: General experience of comfort will improve and/or be controlled Outcome: Progressing   Problem: Safety: Goal: Ability to remain free from injury will improve Outcome: Progressing   Problem: Skin Integrity: Goal: Risk for impaired skin integrity will decrease Outcome: Progressing   Problem: Education: Goal: Knowledge of prescribed regimen will improve Outcome: Progressing   Problem: Activity: Goal: Ability to tolerate increased activity will improve Outcome: Progressing   Problem: Bowel/Gastric: Goal: Gastrointestinal status for postoperative course will improve Outcome: Progressing   Problem: Clinical Measurements: Goal: Postoperative complications will be avoided or minimized Outcome: Progressing Goal: Signs and symptoms of graft occlusion will improve Outcome: Progressing   Problem: Skin Integrity: Goal: Demonstration of wound healing without infection will  improve Outcome: Progressing

## 2023-07-19 NOTE — Progress Notes (Signed)
 Discharge teaching complete. Meds, diet, activity, follow up appointments and incision care reviewed and all questions answered. Copy of instructions given to patient and prescriptions sent to [facility] pharmacy. Discharge lounge will come get patient and call him a cab.

## 2023-07-22 NOTE — Discharge Summary (Signed)
 [facility] Discharge Summary   Patient ID:  Paul Madden MRN: [id number] DOB/AGE: 68-May-1957 68 y.o.  Admit date: 07/16/2023 Discharge date: 07/19/23 Date of Surgery: 07/16/2023 Surgeon: Surgeon(s): Maeola Harman, MD  Admission Diagnosis: Critical limb ischemia of left lower extremity [facility]) [I70.222] Critical lower limb ischemia [facility]) [I70.229]  Discharge Diagnoses:  Critical limb ischemia of left lower extremity (HCC) [I70.222] Critical lower limb ischemia (HCC) [I70.229]  Secondary Diagnoses: Past Medical History:  Diagnosis Date   Chronic low back pain    GERD (gastroesophageal reflux disease)    Hypertension     Procedure(s): LEFT FEMORAL ENDARTERECTOMY WITH RETROGRADE EXTERNAL ILIAC ARTERY STENTING AND ANTEGRADE SUPERFICIAL FEMORAL ARTERY STENTING  Discharged Condition: stable  HPI: 68 year old male with a history of a right lower extremity bypass now has gangrene of his left fifth toe with left external iliac artery occlusion reconstituting the distal common femoral artery at the profunda and SFA bifurcation with occluded SFA with disease distal to this but three-vessel runoff to the foot distal to the occlusion. He is now indicated for common femoral endarterectomy with retrograde and antegrade stenting of the external iliac artery and SFA.    Hospital Course:  Paul Madden is a 68 y.o. male is S/P  Procedure(s): LEFT FEMORAL ENDARTERECTOMY WITH RETROGRADE EXTERNAL ILIAC ARTERY STENTING AND ANTEGRADE SUPERFICIAL FEMORAL ARTERY STENTING Admitting Cr 0.95 this was elevated post op 1.56, GFR decreased to 48 due to contrast dye use.  HGB decreased 7.6 symptomatic 500 ml Blood loss anemia.   He has history of iron deficiency chronic anemia.    Since he started out with normal values this will self correct.   He has history of Leukopenia observed by his PCP   Likely secondary to nutritional deficiencies and/or recent infection  Followed by hematology Post op he was observed   He developed a small right groin hematoma that was not expanding and was not painful.  BLE well perfused and warm with bilateral palpable DP pulses.  Left groin was healing well and without hematoma. He will remain on Aspirin, Plavix, Statin He is stable for discharge home today pending transportation availability Follow up in 2-3 weeks for incision check        Significant Diagnostic Studies: CBC Lab Results  Component Value Date   WBC 2.3 (L) 07/19/2023   HGB 8.8 (L) 07/19/2023   HCT 27.2 (L) 07/19/2023   MCV 86.3 07/19/2023   PLT 536 (H) 07/19/2023    BMET    Component Value Date/Time   NA 136 07/17/2023 0623   NA 145 (H) 11/01/2021 1542   K 4.6 07/17/2023 0623   CL 103 07/17/2023 0623   CO2 25 07/17/2023 0623   GLUCOSE 95 07/17/2023 0623   BUN 16 07/17/2023 0623   BUN 19 11/01/2021 1542   CREATININE 1.56 (H) 07/17/2023 0623   CALCIUM 8.6 (L) 07/17/2023 0623   GFRNONAA 48 (L) 07/17/2023 0623   COAG Lab Results  Component Value Date   INR 1.1 10/23/2021     Disposition:  Discharge to :Home Discharge Instructions     Call MD for:  redness, tenderness, or signs of infection (pain, swelling, bleeding, redness, odor or green/yellow discharge around incision site)   Complete by: As directed    Call MD for:  severe or increased pain, loss or decreased feeling  in affected limb(s)   Complete by: As directed    Call MD for:  temperature >100.5   Complete by: As directed  Discharge patient   Complete by: As directed    Discharge disposition: 01-Home or Self Care   Discharge patient date: 07/19/2023   Increase activity slowly   Complete by: As directed    Walk with assistance use walker or cane as needed   May shower    Complete by: As directed    Resume previous diet   Complete by: As directed       Allergies as of 07/19/2023   No Known Allergies      Medication List     TAKE these medications     amLODipine 10 MG tablet Commonly known as: NORVASC Take 1 tablet (10 mg total) by mouth daily.   aspirin EC 81 MG tablet Take 1 tablet (81 mg total) by mouth daily. Swallow whole.   atorvastatin 40 MG tablet Commonly known as: LIPITOR Take 1 tablet (40 mg total) by mouth at bedtime.   clopidogrel 75 MG tablet Commonly known as: PLAVIX Take 1 tablet (75 mg total) by mouth daily.   ibuprofen 600 MG tablet Commonly known as: ADVIL Take 1 tablet (600 mg total) by mouth every 8 (eight) hours as needed for moderate pain (pain score 4-6). What changed: when to take this   Iron 325 (65 Fe) MG Tabs Take 1 tablet (325 mg total) by mouth every other day. What changed: when to take this   nicotine 21 mg/24hr patch Commonly known as: NICODERM CQ - dosed in mg/24 hours Place 1 patch (21 mg total) onto the skin daily.   oxyCODONE-acetaminophen 5-325 MG tablet Commonly known as: PERCOCET/ROXICET Take 1 tablet by mouth every 6 (six) hours as needed for moderate pain (pain score 4-6).   pantoprazole 40 MG tablet Commonly known as: Protonix Take 1 tablet (40 mg total) by mouth daily.       Verbal and written Discharge instructions given to the patient. Wound care per Discharge AVS  Follow-up Information     Maeola Harman, MD Follow up in 4 week(s).   Specialties: Vascular Surgery, Cardiology Why: Office will call you to arrange your appt (sent) Contact information: [address] [place] [address] [phone]                 Signed: [name] 07/22/2023, 7:45 AM

## 2023-07-23 ENCOUNTER — Telehealth: Payer: Self-pay

## 2023-07-23 ENCOUNTER — Telehealth: Payer: Self-pay | Admitting: *Deleted

## 2023-07-23 DIAGNOSIS — I70223 Atherosclerosis of native arteries of extremities with rest pain, bilateral legs: Secondary | ICD-10-CM

## 2023-07-23 DIAGNOSIS — I10 Essential (primary) hypertension: Secondary | ICD-10-CM

## 2023-07-24 ENCOUNTER — Ambulatory Visit: Payer: Self-pay | Admitting: *Deleted

## 2023-07-24 ENCOUNTER — Ambulatory Visit: Payer: Medicare HMO

## 2023-07-24 NOTE — Progress Notes (Addendum)
 Complex Care Management Note  Care Guide Note 07/24/2023 Name: Rexford Prevo MRN: [id number] DOB: 06-11-1955  Margaretha Seeds is a 68 y.o. year old male who sees Anabel Halon, MD for primary care. I reached out to Margaretha Seeds daughter Cruz Condon DPR on file by phone today to offer complex care management services.  Mr. Degeorge daughter Cruz Condon [facility] on file was given information about Complex Care Management services today including:   The Complex Care Management services include support from the care team which includes your Nurse Care Manager, Clinical Social Worker, or Pharmacist.  The Complex Care Management team is here to help remove barriers to the health concerns and goals most important to you. Complex Care Management services are voluntary, and the patient may decline or stop services at any time by request to their care team member.   Complex Care Management Consent Status: Patient daughter Cruz Condon DPR on file agreed to services and verbal consent obtained.   Follow up plan:  Telephone appointment with complex care management team member scheduled for:  2/26  Encounter Outcome:  Patient Scheduled  Gwenevere Ghazi  [facility]  [facility] Guide  Direct Dial: [phone]  Fax [phone]

## 2023-07-24 NOTE — Patient Outreach (Signed)
 Care Coordination   07/24/2023 Name: Paul Madden MRN: [id number] DOB: Mar 03, 1956   Care Coordination Outreach Attempts:  An unsuccessful telephone outreach was attempted today to offer the patient information about available complex care management services.  Follow Up Plan:  Additional outreach attempts will be made to offer the patient complex care management information and services.   Encounter Outcome:  Patient Request to Call Back   Care Coordination Interventions:  No, not indicated    Teryl Gubler L. Noelle Penner, RN, BSN, CCM [place]  [facility], [facility] RN Care Manager Direct Dial: [phone]  Fax: [phone] Mailing Address: [address]  [place] [address] Website: [email or web address]

## 2023-07-24 NOTE — Patient Outreach (Signed)
 Care Coordination   07/24/2023 Name: Paul Madden MRN: [id number] DOB: Sep 03, 1955   Care Coordination Outreach Attempts:  An unsuccessful outreach was attempted for an appointment today.  Follow Up Plan:  Additional outreach attempts will be made to offer the patient complex care management information and services.   Encounter Outcome:  No Answer   Care Coordination Interventions:  No, not indicated     Girlie Veltri L. Noelle Penner, RN, BSN, CCM [place]  [facility], [facility] RN Care Manager Direct Dial: [phone]  Fax: [phone] Mailing Address: [address]  [place] [address] Website: [email or web address]

## 2023-07-26 ENCOUNTER — Ambulatory Visit: Payer: Self-pay | Admitting: *Deleted

## 2023-07-26 NOTE — Patient Outreach (Signed)
 Care Coordination   07/26/2023 Name: Paul Madden MRN: [id number] DOB: [birth year]   Care Coordination Outreach Attempts:  A third unsuccessful outreach was attempted today to offer the patient with information about available complex care management services.  Thisis the 3rd call this RN CM has placed to his daughter Paul Madden to attempt to assess for needs RN CM spoke briefly with Paul Madden on 07/24/23 am . She requested a return call back when she ws not able to provider the year her father was born and was at a drive thru purchasing food. A second call was made on 07/24/23, no answer a voice message was left requesting a return call to RN CM 07/26/23 Last & third call made to attempt to assess for complex care needs. No answer Left another message requesting a return call to RN CM.   Sent case to CMA for either a call or case closure   Follow Up Plan:  No further outreach attempts will be made at this time. We have been unable to contact the patient to offer or enroll patient in complex care management services.  Encounter Outcome:  No Answer   Care Coordination Interventions:  No, not indicated     Paul Madden L. Paul Penner, RN, BSN, CCM [place]  [facility], [facility] RN Care Manager Direct Dial: [phone]  Fax: [phone] Mailing Address: [address]  [place] [address] Website: [email or web address]

## 2023-07-27 DIAGNOSIS — M869 Osteomyelitis, unspecified: Secondary | ICD-10-CM | POA: Diagnosis not present

## 2023-07-27 DIAGNOSIS — I70223 Atherosclerosis of native arteries of extremities with rest pain, bilateral legs: Secondary | ICD-10-CM | POA: Diagnosis not present

## 2023-07-27 DIAGNOSIS — F1910 Other psychoactive substance abuse, uncomplicated: Secondary | ICD-10-CM | POA: Diagnosis not present

## 2023-07-29 ENCOUNTER — Other Ambulatory Visit (HOSPITAL_COMMUNITY): Payer: Self-pay

## 2023-07-30 ENCOUNTER — Ambulatory Visit: Payer: Medicare HMO | Admitting: Internal Medicine

## 2023-07-31 ENCOUNTER — Ambulatory Visit: Payer: Medicare HMO

## 2023-08-05 ENCOUNTER — Ambulatory Visit (INDEPENDENT_AMBULATORY_CARE_PROVIDER_SITE_OTHER): Payer: Medicare HMO | Admitting: Gastroenterology

## 2023-08-06 ENCOUNTER — Other Ambulatory Visit: Payer: Self-pay

## 2023-08-06 DIAGNOSIS — D703 Neutropenia due to infection: Secondary | ICD-10-CM

## 2023-08-06 DIAGNOSIS — D649 Anemia, unspecified: Secondary | ICD-10-CM

## 2023-08-06 DIAGNOSIS — I96 Gangrene, not elsewhere classified: Secondary | ICD-10-CM

## 2023-08-06 DIAGNOSIS — F1910 Other psychoactive substance abuse, uncomplicated: Secondary | ICD-10-CM

## 2023-08-06 DIAGNOSIS — K279 Peptic ulcer, site unspecified, unspecified as acute or chronic, without hemorrhage or perforation: Secondary | ICD-10-CM

## 2023-08-06 DIAGNOSIS — D500 Iron deficiency anemia secondary to blood loss (chronic): Secondary | ICD-10-CM

## 2023-08-07 ENCOUNTER — Inpatient Hospital Stay: Payer: Medicare HMO

## 2023-08-09 ENCOUNTER — Ambulatory Visit: Payer: Self-pay | Admitting: *Deleted

## 2023-08-09 NOTE — Patient Outreach (Addendum)
 Care Coordination   08/09/2023 Name: Demarcus Thielke MRN: [id number] DOB: [birth year]   Care Coordination Outreach Attempts:  An unsuccessful telephone outreach was attempted today to offer the patient information about available complex care management services.  Follow Up Plan:  No further outreach attempts will be made at this time. We have been unable to contact the patient to offer or enroll patient in complex care management services.  Encounter Outcome:  Patient Visit Completed   Care Coordination Interventions:  Yes, provided  collaboration with [facility] CMA to confirm unable to reach patient family for engagement to establish care. Daughter and others unable to complete HIPAA verification, requesting return calls without answering  Initial referral from pcp for Patient refused HH follow up. High risk for readmission. Admitted 07/16/23 - DX Hypertension, Critical limb ischemia of left lower extremity s/p LEFT FEMORAL ENDARTERECTOMY WITH RETROGRADE EXTERNAL ILIAC ARTERY STENTING AND ANTEGRADE SUPERFICIAL FEMORAL ARTERY STENTING. Had gangrene. Was still smoking cigarettes noted.   Rexanne Inocencio L. Noelle Penner, RN, BSN, CCM [facility], [facility] RN Care Manager Direct Dial: [phone]  Fax: [phone]

## 2023-08-12 ENCOUNTER — Other Ambulatory Visit: Payer: Self-pay

## 2023-08-12 DIAGNOSIS — I739 Peripheral vascular disease, unspecified: Secondary | ICD-10-CM

## 2023-08-13 ENCOUNTER — Inpatient Hospital Stay: Payer: Self-pay | Admitting: Internal Medicine

## 2023-08-14 ENCOUNTER — Inpatient Hospital Stay: Payer: Medicare HMO | Admitting: Oncology

## 2023-08-21 ENCOUNTER — Ambulatory Visit (HOSPITAL_COMMUNITY): Payer: Medicare HMO

## 2023-08-21 ENCOUNTER — Encounter: Payer: Medicare HMO | Admitting: Vascular Surgery

## 2023-08-26 ENCOUNTER — Other Ambulatory Visit (HOSPITAL_COMMUNITY): Payer: Self-pay

## 2023-08-27 DIAGNOSIS — F1910 Other psychoactive substance abuse, uncomplicated: Secondary | ICD-10-CM | POA: Diagnosis not present

## 2023-08-27 DIAGNOSIS — M869 Osteomyelitis, unspecified: Secondary | ICD-10-CM | POA: Diagnosis not present

## 2023-08-27 DIAGNOSIS — I70223 Atherosclerosis of native arteries of extremities with rest pain, bilateral legs: Secondary | ICD-10-CM | POA: Diagnosis not present

## 2023-09-02 ENCOUNTER — Ambulatory Visit: Payer: Medicare HMO

## 2023-10-02 ENCOUNTER — Ambulatory Visit: Payer: Self-pay | Admitting: Internal Medicine

## 2023-10-07 ENCOUNTER — Ambulatory Visit: Payer: Medicare HMO | Admitting: Internal Medicine

## 2023-10-16 ENCOUNTER — Encounter: Payer: Self-pay | Admitting: Internal Medicine

## 2023-12-04 ENCOUNTER — Ambulatory Visit: Payer: Self-pay | Admitting: Internal Medicine

## 2024-02-27 ENCOUNTER — Encounter: Payer: Self-pay | Admitting: Oncology

## 2024-03-05 NOTE — Progress Notes (Signed)
 Paul Madden                                          MRN: [id number]   03/05/2024   The [facility] Quality Team Specialist reviewed this patient medical record for the purposes of chart review for care gap closure. The following were reviewed: abstraction for care gap closure-controlling blood pressure.    [facility] Quality Team

## 2024-05-19 IMAGING — CT CT ANGIO AOBIFEM WO/W CM
1 of 7 series · 4 of 16 positions shown, 5 images · IV contrast (OMNI 350)
Comparison: None Available.

CLINICAL DATA: Right great toe claudication/pain

EXAM:
CT ANGIOGRAPHY OF ABDOMINAL AORTA WITH ILIOFEMORAL RUNOFF
TECHNIQUE: Multidetector CT imaging of the abdomen, pelvis and lower
extremities was performed using the standard protocol during bolus
administration of intravenous contrast. Multiplanar CT image
reconstructions and MIPs were obtained to evaluate the vascular
anatomy.

[Series 7: cta runoff (id) · axial · 0.74mm/px · z∈[+293,+1103]mm · 4 of 452 slices shown, 5 images]
[im 91/452  soft-tissue]
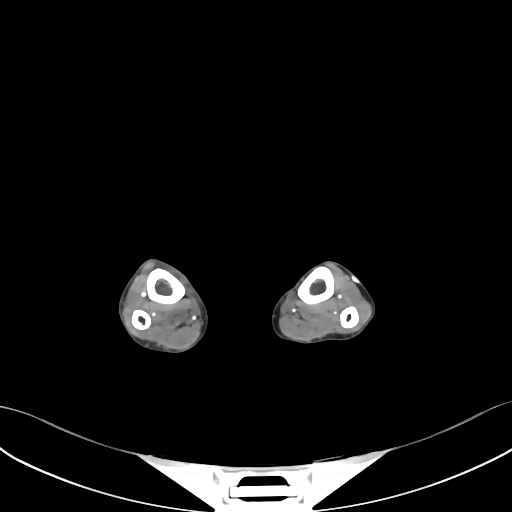
[im 91/452  bone]
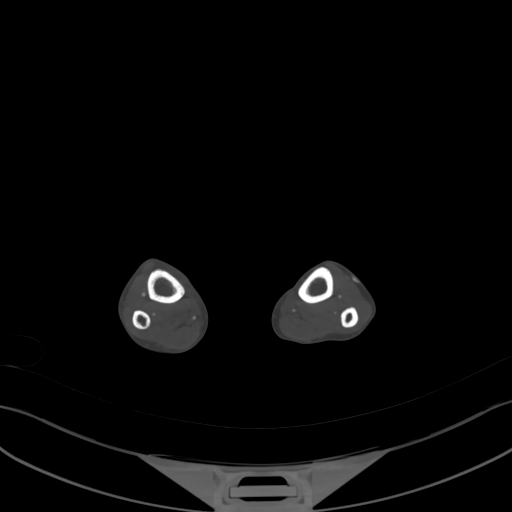
[im 181/452  soft-tissue]
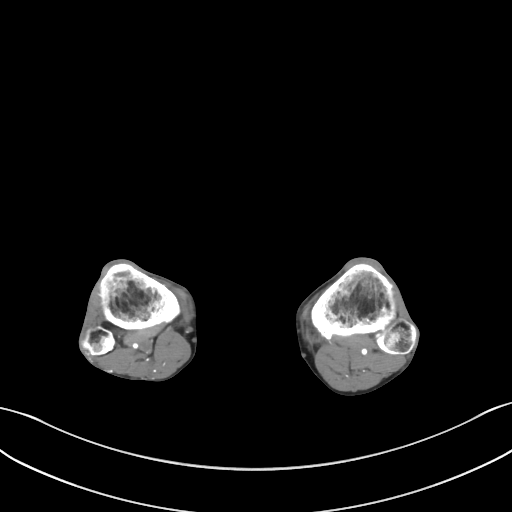
[im 271/452  soft-tissue]
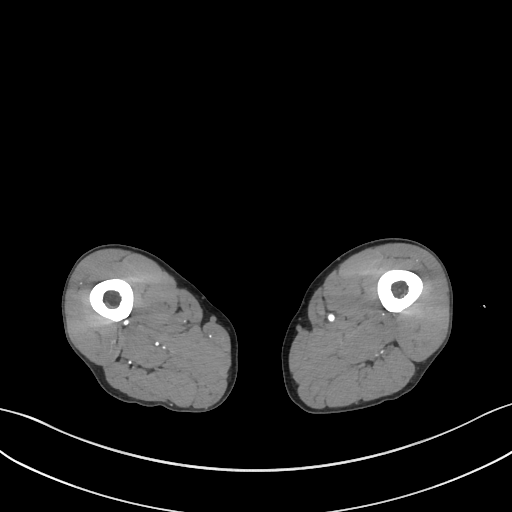
[im 361/452  soft-tissue]
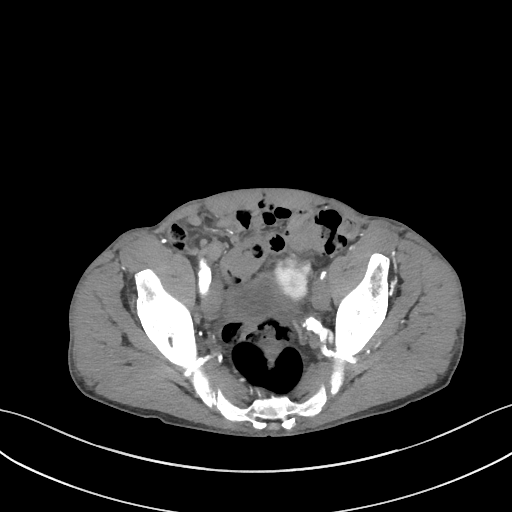

[4 of 16 positions shown; findings below may reference images not displayed]

RADIATION DOSE REDUCTION: This exam was performed according to the
departmental dose-optimization program which includes automated
exposure control, adjustment of the mA and/or kV according to
patient size and/or use of iterative reconstruction technique.

CONTRAST:  100mL OMNIPAQUE IOHEXOL 350 MG/ML SOLN
FINDINGS: VASCULAR

Aorta: Scattered calcified atherosclerotic plaque. No aneurysm or
dissection.

Celiac: Mild stenosis secondary to fibrofatty atherosclerotic plaque
at origin of the celiac artery. No evidence of dissection or
aneurysm.

SMA: Patent without evidence of aneurysm, dissection, vasculitis or
significant stenosis. Calcified plaque at the origin without
evidence of stenosis.

Renals: 2 right-sided renal arteries. Significant stenosis at the
origin of the more inferior artery. Left pelvic kidney. Two
left-sided renal arteries 1 arising from the distal aorta and the
second arising from the proximal right common iliac artery. No
changes of fibromuscular dysplasia.

IMA: Patent without evidence of aneurysm, dissection, vasculitis or
significant stenosis.

RIGHT Lower Extremity

Inflow: Calcified atherosclerotic plaque throughout the common and
external iliac arteries without significant stenosis, dissection,
aneurysm or occlusion. Chronic occlusion of the right internal iliac
artery.

Outflow: Bulky calcified plaque results in at least 50% luminal
narrowing of the common femoral artery. Focal plaque and stenosis at
the origin of the profunda femoral arteries which remain patent.
Chronic total occlusion of the superficial femoral artery beginning
just beyond the origin. The artery remains occluded through the
abductor canal and into the popliteal artery. The P1 segment of the
popliteal artery is occluded. Bulky calcified atherosclerotic plaque
present in the P2 segment of the popliteal artery. There appears to
be reconstitution at the inferior aspect of the patella as the
artery transitions into the P3 segment.

Runoff: Calcified plaque likely resulting in some degree of stenosis
at the origin of the anterior tibial artery and in the tibioperoneal
trunk. However, there appears to be three-vessel runoff to the
ankle.

LEFT Lower Extremity

Inflow: Calcified atherosclerotic plaque throughout the iliac
arteries. No significant stenosis, or dissection in the common or
internal iliac arteries. The left internal iliac artery is
relatively hypertrophic due to chronic total occlusion of the left
external iliac artery. The occlusion extends below the inguinal
ligament into the common femoral artery.

Outflow: The common femoral artery is occluded in the proximal and
mid segment but then reconstitutes via collateral flow from the
profunda femoral artery. There does appear to be a high-grade
stenosis at the origin of the profunda femoral artery. The
superficial femoral artery is patent. Bulky calcified
atherosclerotic plaque results in mild stenosis in the region of the
abductor canal. Popliteal artery is widely patent.

Runoff: Patent 3 vessel runoff to the foot.

Veins: No focal venous abnormality.

Review of the MIP images confirms the above findings.

NON-VASCULAR

Lower chest: No acute abnormality.

Hepatobiliary: No focal liver abnormality is seen. No gallstones,
gallbladder wall thickening, or biliary dilatation.

Pancreas: Unremarkable. No pancreatic ductal dilatation or
surrounding inflammatory changes.

Spleen: No splenic injury or perisplenic hematoma.

Adrenals/Urinary Tract: No adrenal mass. Normally positioned right
kidney. No hydronephrosis, nephrolithiasis or enhancing renal mass.
Tiny 7 mm low-attenuation lesion at the lower pole is too small to
characterize but almost certainly a benign cyst. Mild renal cortical
scarring in the periphery of the interpolar aspect of the kidney.
Left pelvic kidney with mild malrotation. No hydronephrosis,
nephrolithiasis or enhancing renal mass.

Stomach/Bowel: Stomach is within normal limits. Appendix appears
normal. No evidence of bowel wall thickening, distention, or
inflammatory changes.

Lymphatic: No suspicious lymphadenopathy.

Reproductive: Prostate is unremarkable.

Other: Moderately large right inguinal hernia containing multiple
loops of small bowel. No evidence of obstruction.

Musculoskeletal: No acute or significant osseous findings.
IMPRESSION: VASCULAR

1. Severe bilateral peripheral arterial disease as detailed above.
2. On the right, there is chronic total occlusion of the internal
iliac artery and superficial femoral artery from just beyond the
origin all the way through the P1 and P2 segments of the popliteal
artery with reconstitution at the P3 segment.
3. On the left, there is chronic total occlusion of the external
iliac artery and the proximal and mid aspects of the common femoral
artery collateralized by a hypertrophic internal iliac artery and
obturator artery to the profunda femoral artery.
4. Code dominant right-sided renal arteries. There is a high-grade
stenosis at the origin the more inferior right renal artery.
5. Mild stenosis of the origin of the celiac artery.
6. Variant left renal artery anatomy due to pelvic kidney.
7.  Aortic Atherosclerosis ([id number]).

NON-VASCULAR

1. No acute abnormality within the abdomen or pelvis.
2. Left pelvic kidney.
3. Moderately large right inguinal hernia containing multiple loops
of small bowel without evidence of obstruction or inflammation.
4. Additional ancillary findings as above.

## 2024-07-01 ENCOUNTER — Encounter: Payer: Self-pay | Admitting: *Deleted

## 2024-07-01 NOTE — Progress Notes (Signed)
 Paul Madden                                          MRN: [id number]   07/01/2024   The [facility] Quality Team Specialist reviewed this patient medical record for the purposes of chart review for care gap closure. The following were reviewed: chart review for care gap closure-controlling blood pressure.    [facility] Quality Team
